# Patient Record
Sex: Male | Born: 1990
Health system: Southern US, Community
[De-identification: ages and names within clinical notes are randomized; demographics above are authoritative.]

## PROBLEM LIST (undated history)

## (undated) ENCOUNTER — Emergency Department (HOSPITAL_BASED_OUTPATIENT_CLINIC_OR_DEPARTMENT_OTHER): Admission: EM | Payer: BC Managed Care – PPO

## (undated) DIAGNOSIS — D571 Sickle-cell disease without crisis: Secondary | ICD-10-CM

## (undated) DIAGNOSIS — I2699 Other pulmonary embolism without acute cor pulmonale: Secondary | ICD-10-CM

## (undated) HISTORY — DX: Sickle-cell disease without crisis: D57.1

## (undated) HISTORY — PX: DENTAL SURGERY: SHX609

---
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
## 2011-08-16 ENCOUNTER — Ambulatory Visit (INDEPENDENT_AMBULATORY_CARE_PROVIDER_SITE_OTHER): Payer: BC Managed Care – PPO | Admitting: Emergency Medicine

## 2011-08-16 VITALS — BP 122/60 | HR 64 | Temp 98.1°F | Resp 18 | Ht 75.5 in | Wt 189.0 lb

## 2011-08-16 DIAGNOSIS — Z0000 Encounter for general adult medical examination without abnormal findings: Secondary | ICD-10-CM

## 2011-08-16 DIAGNOSIS — D571 Sickle-cell disease without crisis: Secondary | ICD-10-CM

## 2011-08-16 LAB — POCT CBC
Granulocyte percent: 62.2 %G (ref 37–80)
HCT, POC: 31.4 % — AB (ref 43.5–53.7)
Hemoglobin: 10 g/dL — AB (ref 14.1–18.1)
Lymph, poc: 5.3 — AB (ref 0.6–3.4)
MCH, POC: 25.6 pg — AB (ref 27–31.2)
MCHC: 31.8 g/dL (ref 31.8–35.4)
MCV: 80.4 fL (ref 80–97)
MID (cbc): 1.2 — AB (ref 0–0.9)
MPV: 9.2 fL (ref 0–99.8)
POC Granulocyte: 10.8 — AB (ref 2–6.9)
POC LYMPH PERCENT: 30.6 %L (ref 10–50)
POC MID %: 7.2 %M (ref 0–12)
Platelet Count, POC: 522 10*3/uL — AB (ref 142–424)
RBC: 3.9 M/uL — AB (ref 4.69–6.13)
RDW, POC: 26.4 %
WBC: 17.3 10*3/uL — AB (ref 4.6–10.2)

## 2011-08-16 LAB — POCT UA - MICROSCOPIC ONLY
Bacteria, U Microscopic: NEGATIVE
Casts, Ur, LPF, POC: NEGATIVE
Crystals, Ur, HPF, POC: NEGATIVE
Epithelial cells, urine per micros: NEGATIVE
Mucus, UA: NEGATIVE
RBC, urine, microscopic: NEGATIVE
WBC, Ur, HPF, POC: NEGATIVE
Yeast, UA: NEGATIVE

## 2011-08-16 LAB — POCT URINALYSIS DIPSTICK
Bilirubin, UA: NEGATIVE
Blood, UA: NEGATIVE
Glucose, UA: NEGATIVE
Ketones, UA: NEGATIVE
Leukocytes, UA: NEGATIVE
Nitrite, UA: NEGATIVE
Protein, UA: NEGATIVE
Spec Grav, UA: 1.015
Urobilinogen, UA: 1
pH, UA: 6.5

## 2011-08-16 LAB — RETICULOCYTES
ABS Retic: 311.6 10*3/uL — ABNORMAL HIGH (ref 19.0–186.0)
RBC.: 3.71 MIL/uL — ABNORMAL LOW (ref 4.22–5.81)
Retic Ct Pct: 8.4 % — ABNORMAL HIGH (ref 0.4–2.3)

## 2011-08-16 NOTE — Progress Notes (Signed)
Subjective:    Patient ID: Gregory Espinoza, male    DOB: [birth year], 21 y.o.   MRN: [id number]  HPI    Review of Systems  Constitutional: Negative.   HENT: Negative.   Eyes: Negative.   Respiratory: Negative.   Cardiovascular: Negative.   Gastrointestinal: Negative.   Genitourinary: Negative.   Musculoskeletal: Negative.   Neurological: Negative.   Hematological: Negative.   Psychiatric/Behavioral: Negative.        Objective:   Physical Exam  Constitutional: He is oriented to person, place, and time. He appears well-developed and well-nourished.  HENT:  Head: Normocephalic.  Right Ear: External ear normal.  Left Ear: External ear normal.  Eyes:       He has significant scleral icterus  Neck: No tracheal deviation present. No thyromegaly present.  Cardiovascular: Normal rate, regular rhythm and normal heart sounds.   Pulmonary/Chest: Effort normal and breath sounds normal. No respiratory distress. He has no wheezes. He has no rales. He exhibits no tenderness.  Abdominal: Soft. He exhibits no distension and no mass. There is no tenderness. There is no rebound and no guarding.  Musculoskeletal: Normal range of motion.       Patient has a kyphoscoliotic curve involves primarily his thoracic area  Neurological: He is alert and oriented to person, place, and time. No cranial nerve deficit. Coordination normal.  Skin: Skin is warm and dry.   Results for orders placed in visit on 08/16/11  POCT CBC      Component Value Range   WBC 17.3 (*) 4.6 - 10.2 K/uL   Lymph, poc 5.3 (*) 0.6 - 3.4   POC LYMPH PERCENT 30.6  10 - 50 %L   MID (cbc) 1.2 (*) 0 - 0.9   POC MID % 7.2  0 - 12 %M   POC Granulocyte 10.8 (*) 2 - 6.9   Granulocyte percent 62.2  37 - 80 %G   RBC 3.90 (*) 4.69 - 6.13 M/uL   Hemoglobin 10.0 (*) 14.1 - 18.1 g/dL   HCT, POC 40.9 (*) 81.1 - 53.7 %   MCV 80.4  80 - 97 fL   MCH, POC 25.6 (*) 27 - 31.2 pg   MCHC 31.8  31.8 - 35.4 g/dL   RDW, POC 91.4     Platelet  Count, POC 522 (*) 142 - 424 K/uL   MPV 9.2  0 - 99.8 fL  POCT URINALYSIS DIPSTICK      Component Value Range   Color, UA amber     Clarity, UA clear     Glucose, UA neg     Bilirubin, UA neg     Ketones, UA neg     Spec Grav, UA 1.015     Blood, UA neg     pH, UA 6.5     Protein, UA neg     Urobilinogen, UA 1.0     Nitrite, UA neg     Leukocytes, UA Negative    POCT UA - MICROSCOPIC ONLY      Component Value Range   WBC, Ur, HPF, POC neg     RBC, urine, microscopic neg     Bacteria, U Microscopic neg     Mucus, UA neg     Epithelial cells, urine per micros neg     Crystals, Ur, HPF, POC neg     Casts, Ur, LPF, POC neg     Yeast, UA neg           Assessment &  Plan:  Routine labs were done today. He does his sickle cell clinic once a year and should be up-to-date on all his vaccinations. He is currently on folic acid supplement but not on hydroxyurea

## 2011-08-16 NOTE — Patient Instructions (Addendum)
Sickle Cell Anemia Sickle cell anemia needs regular medical care by your caregiver and awareness by you when to seek medical care. Pain is a common problem in children with sickle cell disease. This usually starts at less than 20 year of age. Pain can occur nearly anywhere in the body, but most commonly happens in the extremities, back, chest, or belly (abdomen). Pain episodes can start suddenly or may follow an illness. These attacks can appear as decreased activity, loss of appetite, change in behavior, or simply complaints of pain. DIAGNOSIS   Specialized blood and gene testing can help make this diagnosis early in the disease. Blood tests may then be done to watch blood levels.   Specialized brain scans are done when there are problems in the brain during a crisis.   Lung testing may be done later in the disease.  HOME CARE INSTRUCTIONS   Maintain good hydration. Increase your child's fluid intake in hot weather and during exercise.   Avoid smoking around your child. Smoking lowers the oxygen in the blood and can cause sickling.   Control pain. Only give your child over-the-counter or prescription medicines for pain, discomfort, or fever as directed by their caregiver. Do not give aspirin to children because of the association with Reye's syndrome.   Keep regular health care checks to keep a proper red blood cell (hemoglobin) level. A moderate anemia level protects against sickling crises.   You or your child should receive all the same immunizations and care as the people around them.   Moms should breastfeed their babies if possible. Use formulas with iron added if breastfeeding is not possible. Additional iron should not be given unless there is a lack of it. People with SCD build up iron faster than normal. Give folic acid and additional vitamins as directed.   If you or your child has been prescribed antibiotics or other medications to prevent problems, give them as directed.    Summer camps are available for children with SCD. They may help young people deal with their disease. The camps introduce them to other children with the same condition.   Young people with SCD may become frustrated or angry at their disease. This can cause rebellion and refusal to follow medical care. Help groups or counseling may help with these problems.   Make sure your child wears a medical alert bracelet. When traveling, keep your medical information, caregiver's names, and the medications your child takes with you at all times.  SEEK IMMEDIATE MEDICAL CARE IF:   You or your child feels dizzy or faint.   You or your child develops a new onset of abdominal pain, especially on the left side near the stomach area.   You or your child develops a persistent, often uncomfortable and painful penile erection. This is called priapism. Always check young boys for this. It is often embarrassing for them and they may not bring it to your attention. This is a medical emergency and needs immediate treatment. If this is not treated it will lead to impotence.   You or your child develops numbness in or has a hard time moving arms and legs.   You or your child has a hard time with speech.   You have a fever.   You or your child develops signs of infection (chills, lethargy, irritability, poor eating, vomiting). The younger the child, the more you should be concerned.   With fevers, do not give medications to lower the fever right away. This   could cover up a problem that is developing. Notify your caregiver.   You or your child develops pain that is not helped with medicine.   You or your child develops shortness of breath, or is coughing up pus-like or bloody sputum.   You or your child develops any problems that are new and are causing you to worry.  Document Released: 04/26/2005 Document Revised: 01/05/2011 Document Reviewed: 06/16/2009 [employer] Patient Information 2012 [employer]. 

## 2011-08-17 LAB — TESTOSTERONE, FREE, TOTAL, SHBG
Sex Hormone Binding: 35 nmol/L (ref 13–71)
Testosterone, Free: 91.1 pg/mL (ref 47.0–244.0)
Testosterone-% Free: 2 % (ref 1.6–2.9)
Testosterone: 455.95 ng/dL (ref 300–890)

## 2011-08-17 LAB — TSH: TSH: 4.339 u[IU]/mL (ref 0.350–4.500)

## 2011-08-17 LAB — LIPID PANEL
Cholesterol: 87 mg/dL (ref 0–200)
HDL: 31 mg/dL — ABNORMAL LOW (ref >39)
LDL Cholesterol: 39 mg/dL (ref 0–99)
Total CHOL/HDL Ratio: 2.8 Ratio
Triglycerides: 85 mg/dL (ref <150)
VLDL: 17 mg/dL (ref 0–40)

## 2013-02-10 ENCOUNTER — Telehealth: Payer: Self-pay | Admitting: *Deleted

## 2013-02-10 NOTE — Telephone Encounter (Signed)
Received  [facility] fax from Dr. Cleta Albertsaub- Remind pt to see his sickle cell dr regularly. Sent the fax to be scanned into his chart.  LM on his home number. Please advise when he returns the call.

## 2014-03-27 ENCOUNTER — Ambulatory Visit (INDEPENDENT_AMBULATORY_CARE_PROVIDER_SITE_OTHER): Payer: BC Managed Care – PPO | Admitting: Family Medicine

## 2014-03-27 VITALS — BP 118/72 | HR 75 | Temp 97.5°F | Resp 18 | Ht 77.5 in | Wt 204.0 lb

## 2014-03-27 DIAGNOSIS — IMO0002 Reserved for concepts with insufficient information to code with codable children: Secondary | ICD-10-CM

## 2014-03-27 DIAGNOSIS — R9431 Abnormal electrocardiogram [ECG] [EKG]: Secondary | ICD-10-CM

## 2014-03-27 DIAGNOSIS — R002 Palpitations: Secondary | ICD-10-CM

## 2014-03-27 DIAGNOSIS — D571 Sickle-cell disease without crisis: Secondary | ICD-10-CM

## 2014-03-27 DIAGNOSIS — G8929 Other chronic pain: Secondary | ICD-10-CM

## 2014-03-27 DIAGNOSIS — R519 Headache, unspecified: Secondary | ICD-10-CM

## 2014-03-27 DIAGNOSIS — R51 Headache: Secondary | ICD-10-CM

## 2014-03-27 DIAGNOSIS — D596 Hemoglobinuria due to hemolysis from other external causes: Secondary | ICD-10-CM

## 2014-03-27 LAB — COMPREHENSIVE METABOLIC PANEL
ALT: 15 U/L (ref 0–53)
AST: 24 U/L (ref 0–37)
Albumin: 4.2 g/dL (ref 3.5–5.2)
Alkaline Phosphatase: 46 U/L (ref 39–117)
BUN: 9 mg/dL (ref 6–23)
CO2: 24 mEq/L (ref 19–32)
Calcium: 9.3 mg/dL (ref 8.4–10.5)
Chloride: 104 mEq/L (ref 96–112)
Creat: 0.66 mg/dL (ref 0.50–1.35)
Glucose, Bld: 81 mg/dL (ref 70–99)
Potassium: 4.4 mEq/L (ref 3.5–5.3)
Sodium: 137 mEq/L (ref 135–145)
Total Bilirubin: 8 mg/dL — ABNORMAL HIGH (ref 0.2–1.2)
Total Protein: 7.4 g/dL (ref 6.0–8.3)

## 2014-03-27 LAB — POCT CBC
Granulocyte percent: 75.5 %G (ref 37–80)
HCT, POC: 29.6 % — AB (ref 43.5–53.7)
Hemoglobin: 9.5 g/dL — AB (ref 14.1–18.1)
Lymph, poc: 4 — AB (ref 0.6–3.4)
MCH, POC: 25.6 pg — AB (ref 27–31.2)
MCHC: 32 g/dL (ref 31.8–35.4)
MCV: 80.1 fL (ref 80–97)
MID (cbc): 0.9 (ref 0–0.9)
MPV: 7.4 fL (ref 0–99.8)
POC Granulocyte: 15.3 — AB (ref 2–6.9)
POC LYMPH PERCENT: 19.9 %L (ref 10–50)
POC MID %: 4.6 %M (ref 0–12)
Platelet Count, POC: 481 10*3/uL — AB (ref 142–424)
RBC: 3.69 M/uL — AB (ref 4.69–6.13)
RDW, POC: 26 %
WBC: 20.3 10*3/uL — AB (ref 4.6–10.2)

## 2014-03-27 MED ORDER — PROPRANOLOL HCL 10 MG PO TABS: 10.0000 mg | ORAL_TABLET | Freq: Three times a day (TID) | ORAL | Status: DC

## 2014-03-27 NOTE — Patient Instructions (Signed)
Please return in one week for follow up 

## 2014-03-27 NOTE — Progress Notes (Addendum)
Subjective:    Patient ID: Gregory Espinoza, male    DOB: Jul 07, 1990, 24 y.o.   MRN: [id number] This chart was scribed for Gregory Sidle, MD by Jolene Provost, Medical Scribe. This patient was seen in Room 3 and the patient's care was started a 9:25 AM.  Chief Complaint  Patient presents with   Palpitations    x1 week    Headache    few mths pain in back of head     HPI HPI Comments: Gregory Espinoza is a 24 y.o. male with a hx of sickle cell disease who presents to [facility] complaining of HA for the last eight months and palpitations for the last week. Pt states he has a family hx of HTN. Pt denies family hx of heart disease or HA. Pt states he has a current infection in his tooth and is on abx or this. Pt states he was not put on penicillin when he was a child. Pt states he does not exercise regularly. Pt states he hemoglobin was within normal limits at last test.  Pt states he has not done any sports since he has been in school.  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  Review of Systems  Constitutional: Negative for fever and chills.  Respiratory: Positive for chest tightness.   Cardiovascular: Positive for palpitations.  Gastrointestinal: Positive for abdominal pain. Negative for nausea.  Neurological: Positive for headaches.       Objective:   Physical Exam  Constitutional: He is oriented to person, place, and time. He appears well-developed and well-nourished. No distress.  HENT:  Head: Normocephalic and atraumatic.  Mouth/Throat: Oropharynx is clear and moist.  Eyes: Pupils are equal, round, and reactive to light.  Mild scleral icterus.  Neck: Neck supple.  Cardiovascular: Normal rate, regular rhythm and normal heart sounds.   No murmur heard. Occasional skipped beat.  Pulmonary/Chest: Effort normal and breath sounds normal. No respiratory distress.  Musculoskeletal: Normal range of motion.  Neurological: He is alert and oriented to person,  place, and time. Coordination normal.  Skin: Skin is warm and dry. He is not diaphoretic.  Psychiatric: He has a normal mood and affect. His behavior is normal.  Nursing note and vitals reviewed.  EKG:  occas PVC  Results for orders placed or performed in visit on 03/27/14  POCT CBC  Result Value Ref Range   WBC 20.3 (A) 4.6 - 10.2 K/uL   Lymph, poc 4.0 (A) 0.6 - 3.4   POC LYMPH PERCENT 19.9 10 - 50 %L   MID (cbc) 0.9 0 - 0.9   POC MID % 4.6 0 - 12 %M   POC Granulocyte 15.3 (A) 2 - 6.9   Granulocyte percent 75.5 37 - 80 %G   RBC 3.69 (A) 4.69 - 6.13 M/uL   Hemoglobin 9.5 (A) 14.1 - 18.1 g/dL   HCT, POC 40.9 (A) 81.1 - 53.7 %   MCV 80.1 80 - 97 fL   MCH, POC 25.6 (A) 27 - 31.2 pg   MCHC 32.0 31.8 - 35.4 g/dL   RDW, POC 91.4 %   Platelet Count, POC 481 (A) 142 - 424 K/uL   MPV 7.4 0 - 99.8 fL       Assessment & Plan:   This chart was scribed in my presence and reviewed by me personally.    ICD-9-CM ICD-10-CM   1. Palpitations 785.1 R00.2 POCT CBC     Comprehensive metabolic panel  EKG 12-Lead     Ambulatory referral to Cardiology  2. Chronic nonintractable headache, unspecified headache type 784.0 R51 POCT CBC     Comprehensive metabolic panel     EKG 12-Lead     propranolol (INDERAL) 10 MG tablet  3. Nonspecific abnormal electrocardiogram (ECG) (EKG) 794.31 R94.31 Ambulatory referral to Cardiology  4. 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

## 2014-03-27 NOTE — Addendum Note (Signed)
Addended by: Elvina SidleLAUENSTEIN, Dawnette Mione on: 03/27/2014 05:50 PM   Modules accepted: Orders

## 2014-04-07 ENCOUNTER — Ambulatory Visit (INDEPENDENT_AMBULATORY_CARE_PROVIDER_SITE_OTHER): Payer: BC Managed Care – PPO | Admitting: Family Medicine

## 2014-04-07 VITALS — BP 110/60 | HR 74 | Temp 98.8°F | Resp 20 | Ht 77.5 in | Wt 201.5 lb

## 2014-04-07 DIAGNOSIS — I493 Ventricular premature depolarization: Secondary | ICD-10-CM

## 2014-04-07 DIAGNOSIS — D571 Sickle-cell disease without crisis: Secondary | ICD-10-CM | POA: Diagnosis not present

## 2014-04-07 DIAGNOSIS — R002 Palpitations: Secondary | ICD-10-CM

## 2014-04-07 NOTE — Patient Instructions (Signed)
Results for orders placed or performed in visit on 03/27/14  Comprehensive metabolic panel  Result Value Ref Range   Sodium 137 135 - 145 mEq/L   Potassium 4.4 3.5 - 5.3 mEq/L   Chloride 104 96 - 112 mEq/L   CO2 24 19 - 32 mEq/L   Glucose, Bld 81 70 - 99 mg/dL   BUN 9 6 - 23 mg/dL   Creat 1.610.66 0.960.50 - 0.451.35 mg/dL   Total Bilirubin 8.0 (H) 0.2 - 1.2 mg/dL   Alkaline Phosphatase 46 39 - 117 U/L   AST 24 0 - 37 U/L   ALT 15 0 - 53 U/L   Total Protein 7.4 6.0 - 8.3 g/dL   Albumin 4.2 3.5 - 5.2 g/dL   Calcium 9.3 8.4 - 40.910.5 mg/dL  POCT CBC  Result Value Ref Range   WBC 20.3 (A) 4.6 - 10.2 K/uL   Lymph, poc 4.0 (A) 0.6 - 3.4   POC LYMPH PERCENT 19.9 10 - 50 %L   MID (cbc) 0.9 0 - 0.9   POC MID % 4.6 0 - 12 %M   POC Granulocyte 15.3 (A) 2 - 6.9   Granulocyte percent 75.5 37 - 80 %G   RBC 3.69 (A) 4.69 - 6.13 M/uL   Hemoglobin 9.5 (A) 14.1 - 18.1 g/dL   HCT, POC 81.129.6 (A) 91.443.5 - 53.7 %   MCV 80.1 80 - 97 fL   MCH, POC 25.6 (A) 27 - 31.2 pg   MCHC 32.0 31.8 - 35.4 g/dL   RDW, POC 78.226.0 %   Platelet Count, POC 481 (A) 142 - 424 K/uL   MPV 7.4 0 - 99.8 fL

## 2014-04-07 NOTE — Progress Notes (Addendum)
This chart was scribed for Dr. Elvina Sidle, MD by Jarvis Morgan, Medical Scribe. This patient was seen in Room 10 and the patient's care was started at 6:08 PM.  Patient ID: Gregory Espinoza MRN: [id number], DOB: March 25, 1990, 24 y.o. Date of Encounter: 04/07/2014, 6:04 PM  Primary Physician: No primary care provider on file.  Chief Complaint:  Chief Complaint  Patient presents with  . Follow-up    03/27/2014 Visit     HPI: 24 y.o. year old male with history below presents for a follow up from his visit with Dr. Milus Glazier on 03/27/14. Pt has been having ongoing heart palpitations for over 2 weeks and states they have been gradually worsening. He has a family h/o HTN. At pt's last visit he was also having intermittent HAs for about 8 months, but he states that his HAs have gotten better at this time. Pt has not had any recent f/u with a specialist for his sickle cell anemia. Pt states he regularly gets mild SOB with exertion. Pt notes he has had a few intermittent sharp pains in his abdomen and mild back pain. He has been taking propanol since his last visit with no relief. He denies any fevers and lower extremity swelling   Past Medical History  Diagnosis Date  . Sickle cell anemia      Home Meds: Prior to Admission medications   Medication Sig Start Date End Date Taking? Authorizing Provider  propranolol (INDERAL) 10 MG tablet Take 1 tablet (10 mg total) by mouth 3 (three) times daily. 03/27/14  Yes Elvina Sidle, MD    Allergies: No Known Allergies  History   Social History  . Marital Status: Single    Spouse Name: N/A  . Number of Children: N/A  . Years of Education: N/A   Occupational History  . Not on file.   Social History Main Topics  . Smoking status: Never Smoker   . Smokeless tobacco: Never Used  . Alcohol Use: No  . Drug Use: No  . Sexual Activity: Not on file   Other Topics Concern  . Not on file   Social History Narrative     Review of  Systems Constitutional: negative for chills, fever, night sweats, weight changes, or fatigue  HEENT: negative for vision changes, hearing loss, congestion, rhinorrhea, ST, epistaxis, or sinus pressure Cardiovascular: positive for palpitations. negative for chest pain or leg swelling Respiratory: positive for shortness of breath. negative for hemoptysis, wheezing, or cough Abdominal: positive for abdominal pain (mild and intermittent). Negative for nausea, vomiting, diarrhea, or constipation Dermatological: negative for rash Musk: positive for back pain (mild). negative for gait problem Neurologic: negative for headache, dizziness, or syncope All other systems reviewed and are otherwise negative with the exception to those above and in the HPI.   Physical Exam: Blood pressure 110/60, pulse 74, temperature 98.8 F (37.1 C), temperature source Oral, resp. rate 20, height 6' 5.5" (1.969 m), weight 201 lb 8 oz (91.4 kg), SpO2 93 %., Body mass index is 23.58 kg/(m^2). General: Well developed, well nourished, in no acute distress. Head: Normocephalic, atraumatic, eyes without discharge, sclera icteric, nares are without discharge. Bilateral auditory canals clear, TM's are without perforation, pearly grey and translucent with reflective cone of light bilaterally. Oral cavity moist, posterior pharynx without exudate, erythema, peritonsillar abscess, or post nasal drip.  Neck: Supple. No thyromegaly. Full ROM. No lymphadenopathy. Lungs: Clear bilaterally to auscultation without wheezes, rales, or rhonchi. Breathing is unlabored. Heart: RRR with S1 S2. No  murmurs, rubs, or gallops appreciated. Abdomen: Moderate hepatosplenomegaly. Soft, non-tender, non-distended with normoactive bowel sounds. . No rebound/guarding. No obvious abdominal masses.  Msk:  Strength and tone normal for age. Extremities/Skin: Warm and dry. No clubbing or cyanosis. No edema. No rashes or suspicious lesions. Neuro: Alert and  oriented X 3. Moves all extremities spontaneously. Gait is normal. CNII-XII grossly in tact. Psych:  Responds to questions appropriately with a normal affect.   Labs: Results for orders placed or performed in visit on 03/27/14  Comprehensive metabolic panel  Result Value Ref Range   Sodium 137 135 - 145 mEq/L   Potassium 4.4 3.5 - 5.3 mEq/L   Chloride 104 96 - 112 mEq/L   CO2 24 19 - 32 mEq/L   Glucose, Bld 81 70 - 99 mg/dL   BUN 9 6 - 23 mg/dL   Creat 9.140.66 7.820.50 - 9.561.35 mg/dL   Total Bilirubin 8.0 (H) 0.2 - 1.2 mg/dL   Alkaline Phosphatase 46 39 - 117 U/L   AST 24 0 - 37 U/L   ALT 15 0 - 53 U/L   Total Protein 7.4 6.0 - 8.3 g/dL   Albumin 4.2 3.5 - 5.2 g/dL   Calcium 9.3 8.4 - 21.310.5 mg/dL  POCT CBC  Result Value Ref Range   WBC 20.3 (A) 4.6 - 10.2 K/uL   Lymph, poc 4.0 (A) 0.6 - 3.4   POC LYMPH PERCENT 19.9 10 - 50 %L   MID (cbc) 0.9 0 - 0.9   POC MID % 4.6 0 - 12 %M   POC Granulocyte 15.3 (A) 2 - 6.9   Granulocyte percent 75.5 37 - 80 %G   RBC 3.69 (A) 4.69 - 6.13 M/uL   Hemoglobin 9.5 (A) 14.1 - 18.1 g/dL   HCT, POC 08.629.6 (A) 57.843.5 - 53.7 %   MCV 80.1 80 - 97 fL   MCH, POC 25.6 (A) 27 - 31.2 pg   MCHC 32.0 31.8 - 35.4 g/dL   RDW, POC 46.926.0 %   Platelet Count, POC 481 (A) 142 - 424 K/uL   MPV 7.4 0 - 99.8 fL     ASSESSMENT AND PLAN:  24 y.o. year old male with   1. Palpitations   2. PVC (premature ventricular contraction)   3. Hyperbilirubinemia   4. Hb-SS disease without crisis    This chart was scribed in my presence and reviewed by me personally.    ICD-9-CM ICD-10-CM   1. Palpitations 785.1 R00.2 Ambulatory referral to Cardiology     CBC with Differential/Platelet     Comprehensive metabolic panel  2. PVC (premature ventricular contraction) 427.69 I49.3 Ambulatory referral to Cardiology     Comprehensive metabolic panel  3. Hyperbilirubinemia 782.4 E80.6 Ambulatory referral to Hematology     CBC with Differential/Platelet     Comprehensive metabolic panel   4. 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

## 2014-04-08 LAB — CBC WITH DIFFERENTIAL/PLATELET
Basophils Absolute: 0.1 10*3/uL (ref 0.0–0.1)
Basophils Relative: 1 % (ref 0–1)
Eosinophils Absolute: 0.7 10*3/uL (ref 0.0–0.7)
Eosinophils Relative: 5 % (ref 0–5)
HCT: 27.3 % — ABNORMAL LOW (ref 39.0–52.0)
Hemoglobin: 9.1 g/dL — ABNORMAL LOW (ref 13.0–17.0)
Lymphocytes Relative: 34 % (ref 12–46)
Lymphs Abs: 4.9 10*3/uL — ABNORMAL HIGH (ref 0.7–4.0)
MCH: 25.3 pg — ABNORMAL LOW (ref 26.0–34.0)
MCHC: 33.3 g/dL (ref 30.0–36.0)
MCV: 76 fL — ABNORMAL LOW (ref 78.0–100.0)
MPV: 9.1 fL (ref 8.6–12.4)
Monocytes Absolute: 1.6 10*3/uL — ABNORMAL HIGH (ref 0.1–1.0)
Monocytes Relative: 11 % (ref 3–12)
Neutro Abs: 7.1 10*3/uL (ref 1.7–7.7)
Neutrophils Relative %: 49 % (ref 43–77)
Platelets: 531 10*3/uL — ABNORMAL HIGH (ref 150–400)
RBC: 3.59 MIL/uL — ABNORMAL LOW (ref 4.22–5.81)
RDW: 24.6 % — ABNORMAL HIGH (ref 11.5–15.5)
WBC: 14.5 10*3/uL — ABNORMAL HIGH (ref 4.0–10.5)

## 2014-04-08 LAB — COMPREHENSIVE METABOLIC PANEL
ALT: 11 U/L (ref 0–53)
AST: 24 U/L (ref 0–37)
Albumin: 4.4 g/dL (ref 3.5–5.2)
Alkaline Phosphatase: 43 U/L (ref 39–117)
BUN: 9 mg/dL (ref 6–23)
CO2: 27 mEq/L (ref 19–32)
Calcium: 9.3 mg/dL (ref 8.4–10.5)
Chloride: 104 mEq/L (ref 96–112)
Creat: 0.7 mg/dL (ref 0.50–1.35)
Glucose, Bld: 79 mg/dL (ref 70–99)
Potassium: 4.4 mEq/L (ref 3.5–5.3)
Sodium: 139 mEq/L (ref 135–145)
Total Bilirubin: 7.7 mg/dL — ABNORMAL HIGH (ref 0.2–1.2)
Total Protein: 7.8 g/dL (ref 6.0–8.3)

## 2014-04-09 ENCOUNTER — Telehealth: Payer: Self-pay | Admitting: Oncology

## 2014-04-09 NOTE — Telephone Encounter (Signed)
CALLED REFERRING OFFICE TO INFORM THAT WE DO NOT SEE SICKLE CELL PATIENTS.  WILL REFER TO [facility].

## 2014-04-24 DIAGNOSIS — D571 Sickle-cell disease without crisis: Secondary | ICD-10-CM | POA: Insufficient documentation

## 2014-04-27 ENCOUNTER — Telehealth: Payer: Self-pay

## 2014-04-27 NOTE — Telephone Encounter (Signed)
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  I told him there is a possibility that he made need some shots to complete the form.  If he does please let him know.  The form has been left at the nurses desk.  His number is [phone]

## 2014-04-28 NOTE — Telephone Encounter (Signed)
Form in nurses box.

## 2014-04-28 NOTE — Telephone Encounter (Signed)
Called pt, advised we cannot fill out this form without his immunization record. We checked the [facility] and no uploads of any immunizations were listed. Pt states he will get a copy of his immunizations and bring them to us to review.

## 2014-04-29 ENCOUNTER — Telehealth: Payer: Self-pay

## 2014-04-29 NOTE — Telephone Encounter (Signed)
Form in Dr. Lauenstein box. 

## 2014-04-29 NOTE — Telephone Encounter (Signed)
Pt dropped off immunization record and form he is requesting us to fill out.

## 2014-05-22 ENCOUNTER — Ambulatory Visit: Payer: Self-pay | Admitting: Cardiovascular Disease

## 2015-01-25 ENCOUNTER — Ambulatory Visit (INDEPENDENT_AMBULATORY_CARE_PROVIDER_SITE_OTHER): Payer: BC Managed Care – PPO | Admitting: Family Medicine

## 2015-01-25 ENCOUNTER — Ambulatory Visit (INDEPENDENT_AMBULATORY_CARE_PROVIDER_SITE_OTHER): Payer: BC Managed Care – PPO

## 2015-01-25 VITALS — BP 128/74 | HR 66 | Temp 98.5°F | Resp 16 | Ht 77.5 in | Wt 196.6 lb

## 2015-01-25 DIAGNOSIS — M79672 Pain in left foot: Secondary | ICD-10-CM | POA: Diagnosis not present

## 2015-01-25 DIAGNOSIS — R17 Unspecified jaundice: Secondary | ICD-10-CM | POA: Diagnosis not present

## 2015-01-25 DIAGNOSIS — D571 Sickle-cell disease without crisis: Secondary | ICD-10-CM

## 2015-01-25 DIAGNOSIS — L03119 Cellulitis of unspecified part of limb: Secondary | ICD-10-CM

## 2015-01-25 DIAGNOSIS — L02419 Cutaneous abscess of limb, unspecified: Secondary | ICD-10-CM | POA: Diagnosis not present

## 2015-01-25 LAB — HEPATIC FUNCTION PANEL
ALT: 18 U/L (ref 9–46)
AST: 29 U/L (ref 10–40)
Albumin: 4.3 g/dL (ref 3.6–5.1)
Alkaline Phosphatase: 62 U/L (ref 40–115)
Bilirubin, Direct: 0.7 mg/dL — ABNORMAL HIGH (ref <=0.2)
Indirect Bilirubin: 7.4 mg/dL — ABNORMAL HIGH (ref 0.2–1.2)
Total Bilirubin: 8.1 mg/dL — ABNORMAL HIGH (ref 0.2–1.2)
Total Protein: 7.4 g/dL (ref 6.1–8.1)

## 2015-01-25 LAB — POCT CBC
Granulocyte percent: 65.9 %G (ref 37–80)
HCT, POC: 26.7 % — AB (ref 43.5–53.7)
Hemoglobin: 9.1 g/dL — AB (ref 14.1–18.1)
Lymph, poc: 4.7 — AB (ref 0.6–3.4)
MCH, POC: 26.1 pg — AB (ref 27–31.2)
MCHC: 34 g/dL (ref 31.8–35.4)
MCV: 76.6 fL — AB (ref 80–97)
MID (cbc): 0.7 (ref 0–0.9)
MPV: 7.2 fL (ref 0–99.8)
POC Granulocyte: 10.5 — AB (ref 2–6.9)
POC LYMPH PERCENT: 29.6 %L (ref 10–50)
POC MID %: 4.5 %M (ref 0–12)
Platelet Count, POC: 438 10*3/uL — AB (ref 142–424)
RBC: 3.48 M/uL — AB (ref 4.69–6.13)
RDW, POC: 25.3 %
WBC: 16 10*3/uL — AB (ref 4.6–10.2)

## 2015-01-25 MED ORDER — MELOXICAM 7.5 MG PO TABS: 7.5000 mg | ORAL_TABLET | Freq: Every day | ORAL | Status: DC

## 2015-01-25 MED ORDER — AMOXICILLIN-POT CLAVULANATE 875-125 MG PO TABS: 1.0000 | ORAL_TABLET | Freq: Two times a day (BID) | ORAL | Status: DC

## 2015-01-25 NOTE — Progress Notes (Signed)
 @[facility] @  By signing my name below, I, Raven Small, attest that this documentation has been prepared under the direction and in the presence of Elvina SidleKurt Nonie Lochner, MD.  Electronically Signed: Andrew Auaven Small, ED Scribe. 01/25/2015. 3:00 PM.  Patient ID: Gregory Espinoza MRN: [id number], DOB: August 15, 1990, 24 y.o. Date of Encounter: 01/25/2015, 3:00 PM  Primary Physician: No primary care provider on file.  Chief Complaint:  Chief Complaint  Patient presents with  . Foot Pain    left foot, was swollen yesterday, had a spider bite on same foot    HPI: 24 y.o. year old male with history below presents with left foot pain that began 2 days ago . He denies injury or fall. He reports pain with certain movements and had swelling to left foot yesterday. He states he was bitten by an insect 2 days ago to left calf. States wound has had minimal drainage. He's taken tylenol for his symptoms.  He has hx of sickle cell anemia. He reports having a crisis once at age 66/14. His last hgb was 8.   Pt has noticed darken discoloration to great toe and medial aspect to left foot that has spread over the past couple of month.    Past Medical History  Diagnosis Date  . Sickle cell anemia (HCC)      Home Meds: Prior to Admission medications   Not on File    Allergies: No Known Allergies  Social History   Social History  . Marital Status: Single    Spouse Name: N/A  . Number of Children: N/A  . Years of Education: N/A   Occupational History  . Not on file.   Social History Main Topics  . Smoking status: Never Smoker   . Smokeless tobacco: Never Used  . Alcohol Use: No  . Drug Use: No  . Sexual Activity: Not on file   Other Topics Concern  . Not on file   Social History Narrative     Review of Systems: Constitutional: negative for chills, fever, night sweats, weight changes, or fatigue  HEENT: negative for vision changes, hearing loss, congestion, rhinorrhea, ST, epistaxis, or sinus  pressure Cardiovascular: negative for chest pain or palpitations Respiratory: negative for hemoptysis, wheezing, shortness of breath, or cough Abdominal: negative for abdominal pain, nausea, vomiting, diarrhea, or constipation Dermatological: negative for rash Neurologic: negative for headache, dizziness, or syncope All other systems reviewed and are otherwise negative with the exception to those above and in the HPI.   Physical Exam: Blood pressure 128/74, pulse 66, temperature 98.5 F (36.9 C), temperature source Oral, resp. rate 16, height 6' 5.5" (1.969 m), weight 196 lb 9.6 oz (89.177 kg), SpO2 95 %., Body mass index is 23 kg/(m^2). General: Well developed, well nourished, in no acute distress. Head: Normocephalic, atraumatic, eyes without discharge, sclera non-icteric, nares are without discharge. Bilateral auditory canals clear, TM's are without perforation, pearly grey and translucent with reflective cone of light bilaterally. Oral cavity moist, posterior pharynx without exudate, erythema, peritonsillar abscess, or post nasal drip. Jaundice with sclera icterus Neck: Supple. No thyromegaly. Full ROM. No lymphadenopathy. Lungs: Clear bilaterally to auscultation without wheezes, rales, or rhonchi. Breathing is unlabored. Heart: RRR with S1 S2. No murmurs, rubs, or gallops appreciated. Abdomen: Soft, non-tender, non-distended with normoactive bowel sounds. No hepatomegaly. No rebound/guarding. No obvious abdominal masses. Msk:  Strength and tone normal for age. Extremities/Skin: Warm and dry. No clubbing or cyanosis. No edema. No rashes or suspicious lesions. Hyperpigmentation over left great toes. Small  ulcer to left calf with 1 cm area of induration.. Neuro: Alert and oriented X 3. Moves all extremities spontaneously. Gait is normal. CNII-XII grossly in tact. Psych:  Responds to questions appropriately with a normal affect.   Labs: Results for orders placed or performed in visit on  01/25/15  POCT CBC  Result Value Ref Range   WBC 16.0 (A) 4.6 - 10.2 K/uL   Lymph, poc 4.7 (A) 0.6 - 3.4   POC LYMPH PERCENT 29.6 10 - 50 %L   MID (cbc) 0.7 0 - 0.9   POC MID % 4.5 0 - 12 %M   POC Granulocyte 10.5 (A) 2 - 6.9   Granulocyte percent 65.9 37 - 80 %G   RBC 3.48 (A) 4.69 - 6.13 M/uL   Hemoglobin 9.1 (A) 14.1 - 18.1 g/dL   HCT, POC 08.6 (A) 57.8 - 53.7 %   MCV 76.6 (A) 80 - 97 fL   MCH, POC 26.1 (A) 27 - 31.2 pg   MCHC 34.0 31.8 - 35.4 g/dL   RDW, POC 46.9 %   Platelet Count, POC 438 (A) 142 - 424 K/uL   MPV 7.2 0 - 99.8 fL   [facility] reading (PRIMARY) by Dt. Takiyah Bohnsack. Left foot  normal  ASSESSMENT AND PLAN:  24 y.o. year old male with cellulitis left leg and a tendinitis in the left foot long with some hyperpigmentation without any tenderness This chart was scribed in my presence and reviewed by me personally.    ICD-9-CM ICD-10-CM   1. Hb-SS disease without crisis (HCC) 282.61 D57.1 POCT CBC     Hepatic function panel  2. Jaundice 782.4 R17 Hepatic function panel  3. Left foot pain 729.5 M79.672 DG Foot Complete Left     meloxicam (MOBIC) 7.5 MG tablet  4. Cellulitis and abscess of leg 682.6 L02.419 POCT CBC    L03.119 amoxicillin-clavulanate (AUGMENTIN) 875-125 MG tablet    Signed, Elvina Sidle, MD 01/25/2015 3:00 PM

## 2015-01-25 NOTE — Patient Instructions (Signed)

## 2018-03-13 DIAGNOSIS — R7401 Elevation of levels of liver transaminase levels: Secondary | ICD-10-CM | POA: Insufficient documentation

## 2018-06-14 ENCOUNTER — Encounter (HOSPITAL_COMMUNITY): Payer: Self-pay

## 2018-06-14 ENCOUNTER — Ambulatory Visit (HOSPITAL_COMMUNITY)
Admission: EM | Admit: 2018-06-14 | Discharge: 2018-06-14 | Disposition: A | Discharge status: Home / Self Care | Payer: PPO | Attending: Family Medicine | Admitting: Family Medicine

## 2018-06-14 ENCOUNTER — Ambulatory Visit (INDEPENDENT_AMBULATORY_CARE_PROVIDER_SITE_OTHER): Payer: PPO

## 2018-06-14 ENCOUNTER — Other Ambulatory Visit: Payer: Self-pay

## 2018-06-14 DIAGNOSIS — M222X2 Patellofemoral disorders, left knee: Secondary | ICD-10-CM

## 2018-06-14 MED ORDER — NAPROXEN 500 MG PO TABS: 500.0000 mg | ORAL_TABLET | Freq: Two times a day (BID) | ORAL | 0 refills | Status: DC

## 2018-06-14 NOTE — ED Provider Notes (Signed)
[facility]    CSN: [id number] Arrival date & time: 06/14/18  0840     History   Chief Complaint Chief Complaint  Patient presents with   Knee Pain    HPI Gregory Espinoza is a 28 y.o. male.   Patient is a 28 year old male who presents today with left knee pain.  This is been present and worsening over the past week.  The pain is located around the patella and going proximally.  The pain is worse with walking, bending and stooping.  Reports worsening last night and unable to sleep due to throbbing while laying in bed.  Denies any specific injury.  He has not taken anything for his pain.  He has been using a crutch to help with walking.  No bruising, swelling or erythema.   ROS per HPI      Past Medical History:  Diagnosis Date   Sickle cell anemia (HCC)     There are no active problems to display for this patient.   Past Surgical History:  Procedure Laterality Date   DENTAL SURGERY         Home Medications    Prior to Admission medications   Medication Sig Start Date End Date Taking? Authorizing Provider  folic acid (FOLVITE) 800 MCG tablet Take 400 mcg by mouth daily.   Yes [provider]  hydroxyurea (HYDREA) 500 MG capsule Take 500 mg by mouth daily. May take with food to minimize GI side effects.   Yes [provider]  vitamin C (ASCORBIC ACID) 500 MG tablet Take 500 mg by mouth daily.   Yes [provider]  naproxen (NAPROSYN) 500 MG tablet Take 1 tablet (500 mg total) by mouth 2 (two) times daily. 06/14/18   Janace ArisBast, Betha Shadix A, NP    Family History History reviewed. No pertinent family history.  Social History Social History   Tobacco Use   Smoking status: Never Smoker   Smokeless tobacco: Never Used  Substance Use Topics   Alcohol use: No    Alcohol/week: 0.0 standard drinks   Drug use: No     Allergies   Patient has no known allergies.   Review of Systems Review of Systems   Physical  Exam Triage Vital Signs ED Triage Vitals  Enc Vitals Group     BP 06/14/18 0901 125/86     Pulse Rate 06/14/18 0901 79     Resp 06/14/18 0901 18     Temp 06/14/18 0901 98.4 F (36.9 C)     Temp Source 06/14/18 0901 Oral     SpO2 06/14/18 0901 100 %     Weight 06/14/18 0859 190 lb (86.2 kg)     Height 06/14/18 0859 6\' 4"  (1.93 m)     Head Circumference --      Peak Flow --      Pain Score 06/14/18 0858 8     Pain Loc --      Pain Edu? --      Excl. in GC? --    No data found.  Updated Vital Signs BP 125/86 (BP Location: Left Arm)    Pulse 79    Temp 98.4 F (36.9 C) (Oral)    Resp 18    Ht 6\' 4"  (1.93 m)    Wt 190 lb (86.2 kg)    SpO2 100%    BMI 23.13 kg/m   Visual Acuity Right Eye Distance:   Left Eye Distance:   Bilateral Distance:  Right Eye Near:   Left Eye Near:    Bilateral Near:     Physical Exam Vitals signs and nursing note reviewed.  Constitutional:      General: He is not in acute distress.    Appearance: Normal appearance. He is not ill-appearing, toxic-appearing or diaphoretic.  HENT:     Head: Normocephalic and atraumatic.     Nose: Nose normal.  Eyes:     Conjunctiva/sclera: Conjunctivae normal.  Neck:     Musculoskeletal: Normal range of motion.  Pulmonary:     Effort: Pulmonary effort is normal.  Musculoskeletal:        General: Tenderness present. No swelling, deformity or signs of injury.     Comments: Tender to left patella inferior and medially.  No swelling, bruising or deformities.  No laxity. Most pain with valgus  Skin:    General: Skin is warm and dry.  Neurological:     Mental Status: He is alert.  Psychiatric:        Mood and Affect: Mood normal.      UC Treatments / Results  Labs (all labs ordered are listed, but only abnormal results are displayed) Labs Reviewed - No data to display  EKG None  Radiology Dg Knee Complete 4 Views Left  Result Date: 06/14/2018 CLINICAL DATA:  28 year old male with a history of  left knee pain EXAM: LEFT KNEE - COMPLETE 4+ VIEW COMPARISON:  None. FINDINGS: No acute displaced fracture. No joint effusion. Degenerative changes of the patellofemoral joint. No significant degenerative changes at the medial or lateral compartment. No radiopaque foreign body. No focal soft tissue swelling. IMPRESSION: Negative for acute bony abnormality Electronically Signed   By: Gilmer Mor D.O.   On: 06/14/2018 10:13    Procedures Procedures (including critical care time)  Medications Ordered in UC Medications - No data to display  Initial Impression / Assessment and Plan / UC Course  I have reviewed the triage vital signs and the nursing notes.  Pertinent labs & imaging results that were available during my care of the patient were reviewed by me and considered in my medical decision making (see chart for details).     X-ray negative for any acute abnormalities. Symptoms consistent with patellofemoral syndrome. Will have patient rest, ice and elevate.  Wear the knee brace that was provided here in clinic. Naproxen for pain inflammation Recommended follow-up with orthopedic for continued or worsening symptoms Final Clinical Impressions(s) / UC Diagnoses   Final diagnoses:  Patellofemoral pain syndrome of left knee     Discharge Instructions     Your x ray was normal I believe this is patella femoral syndrome Symptoms should resolve with rest, ice, elevation and naproxen for pain and inflammation.  Wear the knee brace  If symptoms do not improve in the next week or so then you will need to see orthopedics     ED Prescriptions    Medication Sig Dispense Auth. Provider   naproxen (NAPROSYN) 500 MG tablet Take 1 tablet (500 mg total) by mouth 2 (two) times daily. 30 tablet Dahlia Byes A, NP     Controlled Substance Prescriptions [place] Controlled Substance Registry consulted? Not Applicable   Janace Aris, NP 06/14/18 1024

## 2018-06-14 NOTE — ED Triage Notes (Signed)
Patient presents to Urgent Care with complaints of left knee pain since last week. Patient reports he thinks he may have pulled a muscle.

## 2018-06-14 NOTE — Discharge Instructions (Addendum)
Your x ray was normal I believe this is patella femoral syndrome Symptoms should resolve with rest, ice, elevation and naproxen for pain and inflammation.  Wear the knee brace  If symptoms do not improve in the next week or so then you will need to see orthopedics

## 2018-07-27 ENCOUNTER — Emergency Department (HOSPITAL_COMMUNITY)
Admission: EM | Admit: 2018-07-27 | Discharge: 2018-07-27 | Disposition: A | Discharge status: Home / Self Care | Payer: PPO | Attending: Emergency Medicine | Admitting: Emergency Medicine

## 2018-07-27 ENCOUNTER — Other Ambulatory Visit: Payer: Self-pay

## 2018-07-27 ENCOUNTER — Emergency Department (HOSPITAL_COMMUNITY): Payer: PPO

## 2018-07-27 DIAGNOSIS — R0789 Other chest pain: Secondary | ICD-10-CM | POA: Insufficient documentation

## 2018-07-27 DIAGNOSIS — Z79899 Other long term (current) drug therapy: Secondary | ICD-10-CM | POA: Diagnosis not present

## 2018-07-27 DIAGNOSIS — D571 Sickle-cell disease without crisis: Secondary | ICD-10-CM | POA: Insufficient documentation

## 2018-07-27 DIAGNOSIS — R0602 Shortness of breath: Secondary | ICD-10-CM | POA: Insufficient documentation

## 2018-07-27 DIAGNOSIS — R079 Chest pain, unspecified: Secondary | ICD-10-CM

## 2018-07-27 LAB — CBC WITH DIFFERENTIAL/PLATELET
Abs Immature Granulocytes: 0.04 10*3/uL (ref 0.00–0.07)
Basophils Absolute: 0.1 10*3/uL (ref 0.0–0.1)
Basophils Relative: 1 %
Eosinophils Absolute: 0.5 10*3/uL (ref 0.0–0.5)
Eosinophils Relative: 5 %
HCT: 27.9 % — ABNORMAL LOW (ref 39.0–52.0)
Hemoglobin: 10 g/dL — ABNORMAL LOW (ref 13.0–17.0)
Immature Granulocytes: 0 %
Lymphocytes Relative: 30 %
Lymphs Abs: 3 10*3/uL (ref 0.7–4.0)
MCH: 33.7 pg (ref 26.0–34.0)
MCHC: 35.8 g/dL (ref 30.0–36.0)
MCV: 93.9 fL (ref 80.0–100.0)
Monocytes Absolute: 1.3 10*3/uL — ABNORMAL HIGH (ref 0.1–1.0)
Monocytes Relative: 13 %
Neutro Abs: 5.1 10*3/uL (ref 1.7–7.7)
Neutrophils Relative %: 51 %
Platelets: 316 10*3/uL (ref 150–400)
RBC: 2.97 MIL/uL — ABNORMAL LOW (ref 4.22–5.81)
RDW: 20 % — ABNORMAL HIGH (ref 11.5–15.5)
WBC: 10 10*3/uL (ref 4.0–10.5)
nRBC: 1.4 % — ABNORMAL HIGH (ref 0.0–0.2)

## 2018-07-27 LAB — COMPREHENSIVE METABOLIC PANEL
ALT: 32 U/L (ref 0–44)
AST: 45 U/L — ABNORMAL HIGH (ref 15–41)
Albumin: 3.9 g/dL (ref 3.5–5.0)
Alkaline Phosphatase: 50 U/L (ref 38–126)
Anion gap: 7 (ref 5–15)
BUN: 8 mg/dL (ref 6–20)
CO2: 25 mmol/L (ref 22–32)
Calcium: 9.1 mg/dL (ref 8.9–10.3)
Chloride: 105 mmol/L (ref 98–111)
Creatinine, Ser: 0.77 mg/dL (ref 0.61–1.24)
GFR calc Af Amer: >60 mL/min (ref >60)
GFR calc non Af Amer: >60 mL/min (ref >60)
Glucose, Bld: 112 mg/dL — ABNORMAL HIGH (ref 70–99)
Potassium: 4 mmol/L (ref 3.5–5.1)
Sodium: 137 mmol/L (ref 135–145)
Total Bilirubin: 5.3 mg/dL — ABNORMAL HIGH (ref 0.3–1.2)
Total Protein: 7.1 g/dL (ref 6.5–8.1)

## 2018-07-27 LAB — RETICULOCYTES
Immature Retic Fract: 34.8 % — ABNORMAL HIGH (ref 2.3–15.9)
RBC.: 2.97 MIL/uL — ABNORMAL LOW (ref 4.22–5.81)
Retic Count, Absolute: 209.5 10*3/uL — ABNORMAL HIGH (ref 19.0–186.0)
Retic Ct Pct: 6.9 % — ABNORMAL HIGH (ref 0.4–3.1)

## 2018-07-27 LAB — TYPE AND SCREEN
ABO/RH(D): A POS
Antibody Screen: POSITIVE

## 2018-07-27 MED ORDER — HYDROMORPHONE HCL 1 MG/ML IJ SOLN: 0.5000 mg | INTRAMUSCULAR | Status: AC

## 2018-07-27 MED ORDER — HYDROMORPHONE HCL 1 MG/ML IJ SOLN: 1.0000 mg | INTRAMUSCULAR | Status: AC

## 2018-07-27 MED ORDER — HYDROMORPHONE HCL 1 MG/ML IJ SOLN: 1.0000 mg | INTRAMUSCULAR | Status: DC

## 2018-07-27 NOTE — Discharge Instructions (Addendum)
If you develop recurrent, continued, or worsening chest pain, shortness of breath, fever, vomiting, abdominal or back pain, or any other new/concerning symptoms then return to the ER for evaluation.  

## 2018-07-27 NOTE — ED Notes (Signed)
Patient verbalizes understanding of discharge instructions. Opportunity for questioning and answers were provided. Armband removed by staff, pt discharged from ED.  

## 2018-07-27 NOTE — ED Triage Notes (Signed)
Pt reports having sickle cell and has been having chest pain on and off x 3 days along with some sob ; pt denies any cough or fevers

## 2018-07-27 NOTE — ED Provider Notes (Signed)
[facility] Provider Note   CSN: [id number] Arrival date & time: 07/27/18  7124    History   Chief Complaint Chief Complaint  Patient presents with  . Chest Pain    HPI Gregory Espinoza is a 28 y.o. male.     HPI  28 year old male presents with sickle cell pain crisis.  He is had right-sided chest pain for about 3 days.  Called his hematologist oncologist, who is in [place], who recommended he come to the ER.  These pain crises are relatively new for him over the last 1 year.  He has had 2 different episodes similar to this and both times he was diagnosed with acute chest and hospitalized.  He feels short of breath when walking.  No cough, fever.  No leg swelling.  He denies any vomiting.  Pain is about a 4 out of 10.  Past Medical History:  Diagnosis Date  . Sickle cell anemia (HCC)     There are no active problems to display for this patient.   Past Surgical History:  Procedure Laterality Date  . DENTAL SURGERY          Home Medications    Prior to Admission medications   Medication Sig Start Date End Date Taking? Authorizing Provider  folic acid (FOLVITE) 580 MCG tablet Take 400 mcg by mouth daily.    [provider]  hydroxyurea (HYDREA) 500 MG capsule Take 500 mg by mouth daily. May take with food to minimize GI side effects.    [provider]  naproxen (NAPROSYN) 500 MG tablet Take 1 tablet (500 mg total) by mouth 2 (two) times daily. 06/14/18   Loura Halt A, NP  vitamin C (ASCORBIC ACID) 500 MG tablet Take 500 mg by mouth daily.    [provider]    Family History No family history on file.  Social History Social History   Tobacco Use  . Smoking status: Never Smoker  . Smokeless tobacco: Never Used  Substance Use Topics  . Alcohol use: No    Alcohol/week: 0.0 standard drinks  . Drug use: No     Allergies   Patient has no known allergies.   Review of Systems Review of Systems   Constitutional: Negative for fever.  Respiratory: Positive for shortness of breath. Negative for cough.   Cardiovascular: Positive for chest pain.  Gastrointestinal: Negative for abdominal pain and vomiting.  All other systems reviewed and are negative.    Physical Exam Updated Vital Signs BP 131/73 (BP Location: Left Arm)   Pulse 74   Temp 98.1 F (36.7 C) (Oral)   Resp 14   Ht 6\' 4"  (1.93 m)   Wt 94 kg   SpO2 94%   BMI 25.23 kg/m   Physical Exam Vitals signs and nursing note reviewed.  Constitutional:      General: He is not in acute distress.    Appearance: He is well-developed. He is not ill-appearing or diaphoretic.  HENT:     Head: Normocephalic and atraumatic.     Right Ear: External ear normal.     Left Ear: External ear normal.     Nose: Nose normal.  Eyes:     General:        Right eye: No discharge.        Left eye: No discharge.  Neck:     Musculoskeletal: Neck supple.  Cardiovascular:     Rate and Rhythm: Normal rate and regular rhythm.  Heart sounds: Normal heart sounds.  Pulmonary:     Effort: Pulmonary effort is normal.     Breath sounds: Normal breath sounds.  Chest:     Chest wall: No tenderness.  Abdominal:     Palpations: Abdomen is soft.     Tenderness: There is no abdominal tenderness.  Musculoskeletal:     Right lower leg: He exhibits no tenderness. No edema.     Left lower leg: He exhibits no tenderness. No edema.  Skin:    General: Skin is warm and dry.  Neurological:     Mental Status: He is alert.  Psychiatric:        Mood and Affect: Mood is not anxious.      ED Treatments / Results  Labs (all labs ordered are listed, but only abnormal results are displayed) Labs Reviewed  CBC WITH DIFFERENTIAL/PLATELET - Abnormal; Notable for the following components:      Result Value   RBC 2.97 (*)    Hemoglobin 10.0 (*)    HCT 27.9 (*)    RDW 20.0 (*)    nRBC 1.4 (*)    Monocytes Absolute 1.3 (*)    All other components  within normal limits  COMPREHENSIVE METABOLIC PANEL - Abnormal; Notable for the following components:   Glucose, Bld 112 (*)    AST 45 (*)    Total Bilirubin 5.3 (*)    All other components within normal limits  RETICULOCYTES - Abnormal; Notable for the following components:   Retic Ct Pct 6.9 (*)    RBC. 2.97 (*)    Retic Count, Absolute 209.5 (*)    Immature Retic Fract 34.8 (*)    All other components within normal limits  TYPE AND SCREEN    EKG EKG Interpretation  Date/Time:  Saturday July 27 2018 09:17:46 EDT Ventricular Rate:  72 PR Interval:    QRS Duration: 91 QT Interval:  379 QTC Calculation: 415 R Axis:   78 Text Interpretation:  Normal sinus rhythm Ventricular premature complex Probable left ventricular hypertrophy No old tracing to compare Confirmed by Pricilla LovelessGoldston, Jourdan Maldonado [phone]([id number]) on 07/27/2018 9:43:43 AM   Radiology Dg Chest 2 View  Result Date: 07/27/2018 CLINICAL DATA:  Sickle cell disease with pain EXAM: CHEST - 2 VIEW COMPARISON:  November 06, 2005 FINDINGS: There is atelectatic change in the left base. The lungs elsewhere are clear. Heart size and pulmonary vascularity are normal. No adenopathy. There is midthoracic dextroscoliosis. Bony structures otherwise appear unremarkable. IMPRESSION: There is left base atelectasis. No edema or consolidation. No adenopathy. Heart size normal. Midthoracic dextroscoliosis noted. No areas suggesting bone infarct or a vascular necrosis. Electronically Signed   By: Bretta BangWilliam  Woodruff III M.D.   On: 07/27/2018 09:53    Procedures Procedures (including critical care time)  Medications Ordered in ED Medications  HYDROmorphone (DILAUDID) injection 0.5 mg (has no administration in time range)    Or  HYDROmorphone (DILAUDID) injection 0.5 mg (has no administration in time range)  HYDROmorphone (DILAUDID) injection 1 mg (has no administration in time range)    Or  HYDROmorphone (DILAUDID) injection 1 mg (has no administration in time  range)  HYDROmorphone (DILAUDID) injection 1 mg (has no administration in time range)    Or  HYDROmorphone (DILAUDID) injection 1 mg (has no administration in time range)  HYDROmorphone (DILAUDID) injection 1 mg (has no administration in time range)    Or  HYDROmorphone (DILAUDID) injection 1 mg (has no administration in time range)  Initial Impression / Assessment and Plan / ED Course  I have reviewed the triage vital signs and the nursing notes.  Pertinent labs & imaging results that were available during my care of the patient were reviewed by me and considered in my medical decision making (see chart for details).        Labs are overall reassuring.  While he has felt this way with acute chest in the past, he is not hypoxic (sats 96-98% here), afebrile, and does not have consolidation on chest x-ray.  I discussed we could do CT scan but with overall well appearance I think this is less likely to be necessary and we discussed downstream effects of radiation.  He agrees to hold off and would like to take NSAIDs at home and follow-up with his doctor.  Otherwise I discussed he should have a low threshold to return if symptoms do not improve or worsen.  Final Clinical Impressions(s) / ED Diagnoses   Final diagnoses:  Right-sided chest pain    ED Discharge Orders    None       Pricilla LovelessGoldston, Gleason Ardoin, MD 07/27/18 1128

## 2018-07-27 NOTE — ED Notes (Signed)
Patient transported to X-ray 

## 2018-07-27 NOTE — ED Notes (Signed)
Pt states " I am comfortable right now and do not need any pain medication" advised patient that if at any point he needs pain medication , just to notify me

## 2018-10-24 DIAGNOSIS — D5701 Hb-SS disease with acute chest syndrome: Secondary | ICD-10-CM | POA: Insufficient documentation

## 2018-12-13 ENCOUNTER — Encounter: Payer: Self-pay | Admitting: Registered Nurse

## 2018-12-13 ENCOUNTER — Ambulatory Visit (INDEPENDENT_AMBULATORY_CARE_PROVIDER_SITE_OTHER): Payer: PPO | Admitting: Registered Nurse

## 2018-12-13 ENCOUNTER — Other Ambulatory Visit: Payer: Self-pay

## 2018-12-13 VITALS — BP 119/68 | HR 76 | Temp 98.8°F | Resp 18 | Ht 76.0 in | Wt 199.0 lb

## 2018-12-13 DIAGNOSIS — R1031 Right lower quadrant pain: Secondary | ICD-10-CM

## 2018-12-13 DIAGNOSIS — Z7689 Persons encountering health services in other specified circumstances: Secondary | ICD-10-CM | POA: Diagnosis not present

## 2018-12-13 DIAGNOSIS — D649 Anemia, unspecified: Secondary | ICD-10-CM | POA: Insufficient documentation

## 2018-12-13 LAB — POCT CBC
Granulocyte percent: 57.6 %G (ref 37–80)
HCT, POC: 30.5 % (ref 29–41)
Hemoglobin: 10.2 g/dL — AB (ref 11–14.6)
Lymph, poc: 3.2 (ref 0.6–3.4)
MCH, POC: 37.6 pg — AB (ref 27–31.2)
MCHC: 33.5 g/dL (ref 31.8–35.4)
MCV: 112.1 fL — AB (ref 76–111)
MID (cbc): 0.2 (ref 0–0.9)
MPV: 7.4 fL (ref 0–99.8)
POC Granulocyte: 4.6 (ref 2–6.9)
POC LYMPH PERCENT: 39.6 %L (ref 10–50)
POC MID %: 2.8 %M (ref 0–12)
Platelet Count, POC: 149 10*3/uL (ref 142–424)
RBC: 2.72 M/uL — AB (ref 4.69–6.13)
RDW, POC: 20.5 %
WBC: 8 10*3/uL (ref 4.6–10.2)

## 2018-12-13 NOTE — Patient Instructions (Signed)
° ° ° °  If you have lab work done today you will be contacted with your lab results within the next 2 weeks.  If you have not heard from us then please contact us. The fastest way to get your results is to register for My Chart. ° ° °IF you received an x-ray today, you will receive an invoice from [facility]. Please contact [facility] at [phone] with questions or concerns regarding your invoice.  ° °IF you received labwork today, you will receive an invoice from [facility]. Please contact [facility] at [phone] with questions or concerns regarding your invoice.  ° °Our billing staff will not be able to assist you with questions regarding bills from these companies. ° °You will be contacted with the lab results as soon as they are available. The fastest way to get your results is to activate your My Chart account. Instructions are located on the last page of this paperwork. If you have not heard from us regarding the results in 2 weeks, please contact this office. °  ° ° ° °

## 2018-12-17 ENCOUNTER — Telehealth: Payer: Self-pay | Admitting: Registered Nurse

## 2018-12-17 NOTE — Telephone Encounter (Signed)
Copied from [place] [id number]. Topic: General - Other >> Dec 17, 2018  2:24 PM Celene Kras wrote: Reason for CRM: Pt called stating Dr. Orland Mustard stated he would be placing a referral for pt to see a GI doctor. Please advise.

## 2018-12-19 DIAGNOSIS — R1031 Right lower quadrant pain: Secondary | ICD-10-CM | POA: Insufficient documentation

## 2018-12-19 NOTE — Progress Notes (Signed)
New Patient Office Visit  Subjective:  Patient ID: Gregory Espinoza, male    DOB: 07/11/1990  Age: 28 y.o. MRN: [id number]  CC:  Chief Complaint  Patient presents with  . Abdominal Pain    pt states he has been having lower right side abdominal pain for a while and the pain increased about 2 days ago.     HPI Gregory Espinoza presents for LRQ pain. Has been lifelong issue, but recently increasing in frequency and intensity. No changes to bowel habits, diet, or other lifestyle factors. No blood in stool, melena, diarrhea, constipation, or associated symptoms.  Past Medical History:  Diagnosis Date  . Sickle cell anemia (HCC)     Past Surgical History:  Procedure Laterality Date  . DENTAL SURGERY      History reviewed. No pertinent family history.  Social History   Socioeconomic History  . Marital status: Single    Spouse name: Not on file  . Number of children: Not on file  . Years of education: Not on file  . Highest education level: Not on file  Occupational History  . Not on file  Social Needs  . Financial resource strain: Not on file  . Food insecurity    Worry: Not on file    Inability: Not on file  . Transportation needs    Medical: Not on file    Non-medical: Not on file  Tobacco Use  . Smoking status: Never Smoker  . Smokeless tobacco: Never Used  Substance and Sexual Activity  . Alcohol use: No    Alcohol/week: 0.0 standard drinks  . Drug use: No  . Sexual activity: Not on file  Lifestyle  . Physical activity    Days per week: Not on file    Minutes per session: Not on file  . Stress: Not on file  Relationships  . Social Musician on phone: Not on file    Gets together: Not on file    Attends religious service: Not on file    Active member of club or organization: Not on file    Attends meetings of clubs or organizations: Not on file    Relationship status: Not on file  . Intimate partner violence    Fear of current or ex partner:  Not on file    Emotionally abused: Not on file    Physically abused: Not on file    Forced sexual activity: Not on file  Other Topics Concern  . Not on file  Social History Narrative  . Not on file    ROS Review of Systems  Constitutional: Negative.   HENT: Negative.   Eyes: Negative.   Respiratory: Negative.   Cardiovascular: Negative.   Gastrointestinal: Positive for abdominal pain (lrq). Negative for abdominal distention, anal bleeding, blood in stool, constipation, diarrhea, nausea, rectal pain and vomiting.  Endocrine: Negative.   Genitourinary: Negative.   Musculoskeletal: Negative.   Skin: Negative.   Allergic/Immunologic: Negative.   Neurological: Negative.   Hematological: Negative.   Psychiatric/Behavioral: Negative.   All other systems reviewed and are negative.   Objective:   Today's Vitals: BP 119/68   Pulse 76   Temp 98.8 F (37.1 C) (Oral)   Resp 18   Ht 6\' 4"  (1.93 m)   Wt 199 lb (90.3 kg)   SpO2 97%   BMI 24.22 kg/m   Physical Exam Vitals signs and nursing note reviewed.  Constitutional:      General: He is not  in acute distress.    Appearance: He is well-developed and normal weight. He is not ill-appearing, toxic-appearing or diaphoretic.  Abdominal:     General: Abdomen is flat. Bowel sounds are normal. There is no distension or abdominal bruit. There are no signs of injury.     Palpations: Abdomen is soft. There is no shifting dullness, fluid wave, hepatomegaly, splenomegaly, mass or pulsatile mass.     Tenderness: There is abdominal tenderness in the right lower quadrant. There is no right CVA tenderness, left CVA tenderness, guarding or rebound. Negative signs include Murphy's sign, Rovsing's sign, McBurney's sign, psoas sign and obturator sign.  Neurological:     Mental Status: He is alert.     Assessment & Plan:   Problem List Items Addressed This Visit    None    Visit Diagnoses    Abdominal pain, right lower quadrant    -   Primary   Relevant Orders   POCT CBC (Completed)   Ambulatory referral to Gastroenterology      Outpatient Encounter Medications as of 12/13/2018  Medication Sig  . folic acid (FOLVITE) 191 MCG tablet Take by mouth.  . hydroxyurea (HYDREA) 500 MG capsule Take 500 mg by mouth daily. May take with food to minimize GI side effects.  . naproxen (NAPROSYN) 500 MG tablet Take 1 tablet (500 mg total) by mouth 2 (two) times daily.  . pneumococcal 13-valent conjugate vaccine (PREVNAR 13) SUSP injection Inject into the muscle.  . Tdap (BOOSTRIX) 5-2.5-18.5 LF-MCG/0.5 injection Inject into the muscle.  . vitamin C (ASCORBIC ACID) 500 MG tablet Take 500 mg by mouth daily.  . [DISCONTINUED] folic acid (FOLVITE) 478 MCG tablet Take 400 mcg by mouth daily.   No facility-administered encounter medications on file as of 12/13/2018.     Follow-up: No follow-ups on file.   PLAN  No clear etiology to pain - nothing on exam or POCT CBC reveals any concern. Location may suggest appendicitis, but pain is not severe enough and WBCs not elevated - does not suggest further management  Will refer to GI  Patient encouraged to call clinic with any questions, comments, or concerns.   Maximiano Coss, NP

## 2018-12-20 ENCOUNTER — Encounter: Payer: Self-pay | Admitting: Gastroenterology

## 2018-12-31 DIAGNOSIS — D696 Thrombocytopenia, unspecified: Secondary | ICD-10-CM

## 2018-12-31 DIAGNOSIS — D649 Anemia, unspecified: Secondary | ICD-10-CM

## 2018-12-31 DIAGNOSIS — E559 Vitamin D deficiency, unspecified: Secondary | ICD-10-CM

## 2018-12-31 HISTORY — DX: Thrombocytopenia, unspecified: D69.6

## 2018-12-31 HISTORY — DX: Vitamin D deficiency, unspecified: E55.9

## 2018-12-31 HISTORY — DX: Anemia, unspecified: D64.9

## 2019-01-08 ENCOUNTER — Ambulatory Visit (INDEPENDENT_AMBULATORY_CARE_PROVIDER_SITE_OTHER): Payer: PPO | Admitting: Family Medicine

## 2019-01-08 ENCOUNTER — Encounter: Payer: Self-pay | Admitting: Family Medicine

## 2019-01-08 ENCOUNTER — Other Ambulatory Visit: Payer: Self-pay

## 2019-01-08 VITALS — BP 124/66 | HR 83 | Temp 97.7°F | Ht 76.0 in | Wt 199.6 lb

## 2019-01-08 DIAGNOSIS — D571 Sickle-cell disease without crisis: Secondary | ICD-10-CM

## 2019-01-08 DIAGNOSIS — Z7689 Persons encountering health services in other specified circumstances: Secondary | ICD-10-CM | POA: Diagnosis not present

## 2019-01-08 DIAGNOSIS — R1084 Generalized abdominal pain: Secondary | ICD-10-CM | POA: Diagnosis not present

## 2019-01-08 DIAGNOSIS — R109 Unspecified abdominal pain: Secondary | ICD-10-CM

## 2019-01-08 DIAGNOSIS — R10A0 Flank pain, unspecified side: Secondary | ICD-10-CM

## 2019-01-08 DIAGNOSIS — D5701 Hb-SS disease with acute chest syndrome: Secondary | ICD-10-CM

## 2019-01-08 DIAGNOSIS — Z09 Encounter for follow-up examination after completed treatment for conditions other than malignant neoplasm: Secondary | ICD-10-CM

## 2019-01-08 LAB — POCT URINALYSIS DIPSTICK
Bilirubin, UA: NEGATIVE
Blood, UA: NEGATIVE
Glucose, UA: NEGATIVE
Ketones, UA: NEGATIVE
Leukocytes, UA: NEGATIVE
Nitrite, UA: NEGATIVE
Protein, UA: NEGATIVE
Spec Grav, UA: 1.015 (ref 1.010–1.025)
Urobilinogen, UA: 1 E.U./dL
pH, UA: 6 (ref 5.0–8.0)

## 2019-01-08 NOTE — Progress Notes (Signed)
Patient [name]   Established Patient Office Visit  Subjective:  Patient ID: Gregory Espinoza, male    DOB: 1990/03/26  Age: 28 y.o. MRN: [id number]  CC:  Chief Complaint  Patient presents with   Establish Care    Sickle Cell     HPI Gregory Espinoza is a 28 year old male who presents to [facility] today.   Past Medical History:  Diagnosis Date   Sickle cell anemia (HCC)     Current Status: This will be Gregory Espinoza initial office visit with me. He was previously seeing Dr.Chalasani at [facility] in [place], [place] for his PCP needs. He currently has 1 more semester in school in [place] ([employer]) and he will be returning back to his home in [place], [place]. Since his last office visit, he is doing well with no complaints. He states that he occasionally experiences pain in his knees and elbows. He rates his pain today at 2/10. He has not had a hospital visit for Sickle Cell Crisis, with acute chest syndrome, since 03/2018 where he was treated and discharged a few days later. He reports that this was his initial pain crisis. He is currently taking all medications as prescribed and staying well hydrated. He reports occasional nausea, constipation, dizziness and headaches. He has c/o swollen feet at least once monthly, since beginning Hydrea 03/2018. He reports yellow scelera as normal to him. He has begin to have pain in upper torso and bilateral flank area. He also has abdominal pain occasionally, which leads to diarrhea. He occasionally takes Acetaminophen for pain. He is not taking any other pain medications. He denies fevers, chills, fatigue, recent infections, weight loss, and night sweats.  No reports of GI problems such as nausea, vomiting, diarrhea, and constipation. He has no reports of blood in stools, dysuria and hematuria. No depression or anxiety reported today. He denies suicidal ideations, homicidal ideations, or  auditory hallucinations.   Past Surgical History:  Procedure Laterality Date   DENTAL SURGERY      Family History  Problem Relation Age of Onset   Hypertension Father    Sickle cell anemia Brother     Social History   Socioeconomic History   Marital status: Single    Spouse name: Not on file   Number of children: Not on file   Years of education: Not on file   Highest education level: Not on file  Occupational History   Not on file  Tobacco Use   Smoking status: Never Smoker   Smokeless tobacco: Never Used  Substance and Sexual Activity   Alcohol use: No    Alcohol/week: 0.0 standard drinks   Drug use: No   Sexual activity: Yes    Birth control/protection: Condom  Other Topics Concern   Not on file  Social History Narrative   Not on file   Social Determinants of Health   Financial Resource Strain:    Difficulty of Paying Living Expenses: Not on file  Food Insecurity:    Worried About Charity fundraiser in the Last Year: Not on file   [employer] of Food in the Last Year: Not on file  Transportation Needs:    Lack of Transportation (Medical): Not on file   Lack of Transportation (Non-Medical): Not on file  Physical Activity:    Days of Exercise per Week: Not on file   Minutes of Exercise per Session: Not on file  Stress:  Feeling of Stress : Not on file  Social Connections:    Frequency of Communication with Friends and Family: Not on file   Frequency of Social Gatherings with Friends and Family: Not on file   Attends Religious Services: Not on file   Active Member of Clubs or Organizations: Not on file   Attends BankerClub or Organization Meetings: Not on file   Marital Status: Not on file  Intimate Partner Violence:    Fear of Current or Ex-Partner: Not on file   Emotionally Abused: Not on file   Physically Abused: Not on file   Sexually Abused: Not on file    Outpatient Medications Prior to Visit  Medication Sig Dispense  Refill   folic acid (FOLVITE) 800 MCG tablet Take by mouth.     hydroxyurea (HYDREA) 500 MG capsule Take 500 mg by mouth daily. May take with food to minimize GI side effects.     naproxen (NAPROSYN) 500 MG tablet Take 1 tablet (500 mg total) by mouth 2 (two) times daily. 30 tablet 0   vitamin C (ASCORBIC ACID) 500 MG tablet Take 500 mg by mouth daily.     pneumococcal 13-valent conjugate vaccine (PREVNAR 13) SUSP injection Inject into the muscle.     Tdap (BOOSTRIX) 5-2.5-18.5 LF-MCG/0.5 injection Inject into the muscle.     No facility-administered medications prior to visit.    No Known Allergies  ROS Review of Systems  Constitutional: Negative.   HENT: Negative.   Eyes: Negative.   Respiratory: Negative.   Cardiovascular: Negative.   Gastrointestinal: Positive for nausea (occasional).       Flank pain  Endocrine: Negative.   Genitourinary: Negative.   Musculoskeletal: Positive for arthralgias (generalized joint pain).  Skin: Negative.   Allergic/Immunologic: Negative.   Neurological: Positive for dizziness (occasional) and headaches (occasional ).  Hematological: Negative.   Psychiatric/Behavioral: Negative.     Objective:    Physical Exam  Constitutional: He is oriented to person, place, and time. He appears well-developed and well-nourished.  HENT:  Head: Normocephalic and atraumatic.  Eyes: Conjunctivae are normal.    Neck: Normal range of motion. Neck supple.  Cardiovascular: Normal rate, regular rhythm, normal heart sounds and intact distal pulses.  Pulmonary/Chest: Effort normal and breath sounds normal.  Abdominal: Soft. Bowel sounds are normal.  Musculoskeletal: Normal range of motion.  Neurological: He is alert and oriented to person, place, and time. He has normal reflexes.  Skin: Skin is warm and dry.  Psychiatric: He has a normal mood and affect. His behavior is normal. Judgment and thought content normal.  Nursing note and vitals  reviewed.   BP 124/66    Pulse 83    Temp 97.7 F (36.5 C) (Oral)    Ht 6\' 4"  (1.93 m)    Wt 199 lb 9.6 oz (90.5 kg)    SpO2 95%    BMI 24.30 kg/m  Wt Readings from Last 3 Encounters:  01/08/19 199 lb 9.6 oz (90.5 kg)  12/13/18 199 lb (90.3 kg)  07/27/18 207 lb 3.7 oz (94 kg)     Health Maintenance Due  Topic Date Due   HIV Screening  12/17/2006   TETANUS/TDAP  12/17/2010    There are no preventive care reminders to display for this patient.  Lab Results  Component Value Date   TSH 4.339 08/16/2011   Lab Results  Component Value Date   WBC 5.8 01/08/2019   HGB 9.0 (L) 01/08/2019   HCT 25.9 (L) 01/08/2019  MCV 111 (H) 01/08/2019   PLT 110 (L) 01/08/2019   Lab Results  Component Value Date   NA 140 01/08/2019   K 4.1 01/08/2019   CO2 24 01/08/2019   GLUCOSE 86 01/08/2019   BUN 6 01/08/2019   CREATININE 0.63 (L) 01/08/2019   BILITOT 4.4 (H) 01/08/2019   ALKPHOS 55 01/08/2019   AST 28 01/08/2019   ALT 12 01/08/2019   PROT 7.4 01/08/2019   ALBUMIN 4.3 01/08/2019   CALCIUM 9.3 01/08/2019   ANIONGAP 7 07/27/2018   Lab Results  Component Value Date   CHOL 87 08/16/2011   Lab Results  Component Value Date   HDL 31 (L) 08/16/2011   Lab Results  Component Value Date   LDLCALC 39 08/16/2011   Lab Results  Component Value Date   TRIG 85 08/16/2011   Lab Results  Component Value Date   CHOLHDL 2.8 08/16/2011   No results found for: HGBA1C    Assessment & Plan:   1. Encounter to establish care  2. Sickle cell disease, type SS (HCC) He is doing well today. He will continue to take all medications as prescribed; will continue to avoid extreme heat and cold; will continue to eat a healthy diet and drink at least 64 ounces of water daily; continue stool softener as needed; will avoid colds and flu; will continue to get plenty of sleep and rest; will continue to avoid high stressful situations and remain infection free; will continue Folic Acid 1 mg daily  to avoid sickle cell crisis. Continue to follow up with Hematologist as needed.  - Urinalysis Dipstick - Hemoglobinopathy evaluation - Sickle Cell Panel  3. Hb-SS disease with acute chest syndrome (HCC)  4. Generalized abdominal pain  5. Flank pain  6. Follow up He will follow up in 2 months.   No orders of the defined types were placed in this encounter.   Orders Placed This Encounter  Procedures   Hemoglobinopathy evaluation   Sickle Cell Panel   Urinalysis Dipstick    Referral Orders  No referral(s) requested today    Raliegh Ip,  MSN, FNP-BC [facility] [address] [place], [place] [address] [phone]- fax  Problem List Items Addressed This Visit      Respiratory   Hb-SS disease with acute chest syndrome (HCC)     Other   Sickle cell disease, type SS (HCC)   Relevant Orders   Urinalysis Dipstick (Completed)   Hemoglobinopathy evaluation (Completed)   Sickle Cell Panel (Completed)    Other Visit Diagnoses    Encounter to establish care    -  Primary   Generalized abdominal pain       Flank pain       Follow up          No orders of the defined types were placed in this encounter.   Follow-up: Return in about 1 month (around 02/08/2019).    Kallie Locks, FNP

## 2019-01-14 LAB — CMP14+CBC/D/PLT+FER+RETIC+V...
ALT: 12 IU/L (ref 0–44)
AST: 28 IU/L (ref 0–40)
Albumin/Globulin Ratio: 1.4 (ref 1.2–2.2)
Albumin: 4.3 g/dL (ref 4.1–5.2)
Alkaline Phosphatase: 55 IU/L (ref 39–117)
BUN/Creatinine Ratio: 10 (ref 9–20)
BUN: 6 mg/dL (ref 6–20)
Basophils Absolute: 0 10*3/uL (ref 0.0–0.2)
Basos: 1 %
Bilirubin Total: 4.4 mg/dL — ABNORMAL HIGH (ref 0.0–1.2)
CO2: 24 mmol/L (ref 20–29)
Calcium: 9.3 mg/dL (ref 8.7–10.2)
Chloride: 105 mmol/L (ref 96–106)
Creatinine, Ser: 0.63 mg/dL — ABNORMAL LOW (ref 0.76–1.27)
EOS (ABSOLUTE): 0.2 10*3/uL (ref 0.0–0.4)
Eos: 3 %
Ferritin: 171 ng/mL (ref 30–400)
GFR calc Af Amer: 156 mL/min/{1.73_m2} (ref >59)
GFR calc non Af Amer: 135 mL/min/{1.73_m2} (ref >59)
Globulin, Total: 3.1 g/dL (ref 1.5–4.5)
Glucose: 86 mg/dL (ref 65–99)
Hematocrit: 25.9 % — ABNORMAL LOW (ref 37.5–51.0)
Hemoglobin: 9 g/dL — ABNORMAL LOW (ref 13.0–17.7)
Immature Grans (Abs): 0 10*3/uL (ref 0.0–0.1)
Immature Granulocytes: 0 %
Lymphocytes Absolute: 2.7 10*3/uL (ref 0.7–3.1)
Lymphs: 46 %
MCH: 38.6 pg — ABNORMAL HIGH (ref 26.6–33.0)
MCHC: 34.7 g/dL (ref 31.5–35.7)
MCV: 111 fL — ABNORMAL HIGH (ref 79–97)
Monocytes Absolute: 0.8 10*3/uL (ref 0.1–0.9)
Monocytes: 14 %
NRBC: 5 % — ABNORMAL HIGH (ref 0–0)
Neutrophils Absolute: 2.1 10*3/uL (ref 1.4–7.0)
Neutrophils: 36 %
Platelets: 110 10*3/uL — ABNORMAL LOW (ref 150–450)
Potassium: 4.1 mmol/L (ref 3.5–5.2)
RBC: 2.33 x10E6/uL — CL (ref 4.14–5.80)
RDW: 18.4 % — ABNORMAL HIGH (ref 11.6–15.4)
Retic Ct Pct: 4.5 % — ABNORMAL HIGH (ref 0.6–2.6)
Sodium: 140 mmol/L (ref 134–144)
Total Protein: 7.4 g/dL (ref 6.0–8.5)
Vit D, 25-Hydroxy: 21.6 ng/mL — ABNORMAL LOW (ref 30.0–100.0)
WBC: 5.8 10*3/uL (ref 3.4–10.8)

## 2019-01-14 LAB — HEMOGLOBINOPATHY EVALUATION
HGB C: 0 %
HGB S: 80 % — ABNORMAL HIGH
HGB VARIANT: 0 %
Hemoglobin A2 Quantitation: 4.3 % — ABNORMAL HIGH (ref 1.8–3.2)
Hemoglobin F Quantitation: 15.7 % — ABNORMAL HIGH (ref 0.0–2.0)
Hgb A: 0 % — ABNORMAL LOW (ref 96.4–98.8)

## 2019-01-20 ENCOUNTER — Other Ambulatory Visit: Payer: Self-pay | Admitting: Family Medicine

## 2019-01-20 ENCOUNTER — Encounter: Payer: Self-pay | Admitting: Family Medicine

## 2019-01-20 DIAGNOSIS — D5701 Hb-SS disease with acute chest syndrome: Secondary | ICD-10-CM

## 2019-01-20 DIAGNOSIS — D649 Anemia, unspecified: Secondary | ICD-10-CM

## 2019-01-20 DIAGNOSIS — D571 Sickle-cell disease without crisis: Secondary | ICD-10-CM

## 2019-01-20 DIAGNOSIS — D696 Thrombocytopenia, unspecified: Secondary | ICD-10-CM

## 2019-01-20 DIAGNOSIS — E569 Vitamin deficiency, unspecified: Secondary | ICD-10-CM

## 2019-01-20 MED ORDER — VITAMIN D (ERGOCALCIFEROL) 1.25 MG (50000 UNIT) PO CAPS: 50000.0000 [IU] | ORAL_CAPSULE | ORAL | 3 refills | Status: DC

## 2019-01-22 ENCOUNTER — Telehealth: Payer: Self-pay

## 2019-01-22 NOTE — Telephone Encounter (Signed)
Contacted pt to go over lab results pt is aware and is schedule for a lab appointment for 12/28. Pt doesn't have any questions or concerns

## 2019-01-27 ENCOUNTER — Other Ambulatory Visit: Payer: Self-pay

## 2019-01-27 ENCOUNTER — Other Ambulatory Visit: Payer: PPO

## 2019-01-27 DIAGNOSIS — D696 Thrombocytopenia, unspecified: Secondary | ICD-10-CM

## 2019-01-27 DIAGNOSIS — D571 Sickle-cell disease without crisis: Secondary | ICD-10-CM

## 2019-01-27 DIAGNOSIS — D649 Anemia, unspecified: Secondary | ICD-10-CM

## 2019-01-28 LAB — CBC WITH DIFFERENTIAL/PLATELET
Basophils Absolute: 0 10*3/uL (ref 0.0–0.2)
Basos: 1 %
EOS (ABSOLUTE): 0.1 10*3/uL (ref 0.0–0.4)
Eos: 2 %
Hematocrit: 27.2 % — ABNORMAL LOW (ref 37.5–51.0)
Hemoglobin: 9.6 g/dL — ABNORMAL LOW (ref 13.0–17.7)
Immature Grans (Abs): 0 10*3/uL (ref 0.0–0.1)
Immature Granulocytes: 0 %
Lymphocytes Absolute: 2.9 10*3/uL (ref 0.7–3.1)
Lymphs: 42 %
MCH: 39.2 pg — ABNORMAL HIGH (ref 26.6–33.0)
MCHC: 35.3 g/dL (ref 31.5–35.7)
MCV: 111 fL — ABNORMAL HIGH (ref 79–97)
Monocytes Absolute: 0.8 10*3/uL (ref 0.1–0.9)
Monocytes: 12 %
NRBC: 1 % — ABNORMAL HIGH (ref 0–0)
Neutrophils Absolute: 3 10*3/uL (ref 1.4–7.0)
Neutrophils: 43 %
Platelets: 299 10*3/uL (ref 150–450)
RBC: 2.45 x10E6/uL — CL (ref 4.14–5.80)
RDW: 16.7 % — ABNORMAL HIGH (ref 11.6–15.4)
WBC: 6.9 10*3/uL (ref 3.4–10.8)

## 2019-01-29 ENCOUNTER — Ambulatory Visit: Payer: PPO | Admitting: Gastroenterology

## 2019-02-02 ENCOUNTER — Other Ambulatory Visit: Payer: Self-pay | Admitting: Family Medicine

## 2019-02-02 DIAGNOSIS — D649 Anemia, unspecified: Secondary | ICD-10-CM

## 2019-02-02 DIAGNOSIS — D571 Sickle-cell disease without crisis: Secondary | ICD-10-CM

## 2019-02-04 ENCOUNTER — Other Ambulatory Visit: Payer: PPO

## 2019-02-04 ENCOUNTER — Other Ambulatory Visit: Payer: Self-pay

## 2019-02-04 DIAGNOSIS — D571 Sickle-cell disease without crisis: Secondary | ICD-10-CM

## 2019-02-04 DIAGNOSIS — D649 Anemia, unspecified: Secondary | ICD-10-CM

## 2019-02-05 LAB — CBC WITH DIFFERENTIAL/PLATELET
Basophils Absolute: 0.1 10*3/uL (ref 0.0–0.2)
Basos: 1 %
EOS (ABSOLUTE): 0.2 10*3/uL (ref 0.0–0.4)
Eos: 3 %
Hematocrit: 27 % — ABNORMAL LOW (ref 37.5–51.0)
Hemoglobin: 9.6 g/dL — ABNORMAL LOW (ref 13.0–17.7)
Immature Grans (Abs): 0 10*3/uL (ref 0.0–0.1)
Immature Granulocytes: 0 %
Lymphocytes Absolute: 3 10*3/uL (ref 0.7–3.1)
Lymphs: 47 %
MCH: 38.6 pg — ABNORMAL HIGH (ref 26.6–33.0)
MCHC: 35.6 g/dL (ref 31.5–35.7)
MCV: 108 fL — ABNORMAL HIGH (ref 79–97)
Monocytes Absolute: 0.8 10*3/uL (ref 0.1–0.9)
Monocytes: 13 %
NRBC: 2 % — ABNORMAL HIGH (ref 0–0)
Neutrophils Absolute: 2.3 10*3/uL (ref 1.4–7.0)
Neutrophils: 36 %
Platelets: 124 10*3/uL — ABNORMAL LOW (ref 150–450)
RBC: 2.49 x10E6/uL — CL (ref 4.14–5.80)
RDW: 16.7 % — ABNORMAL HIGH (ref 11.6–15.4)
WBC: 6.3 10*3/uL (ref 3.4–10.8)

## 2019-02-11 ENCOUNTER — Other Ambulatory Visit: Payer: Self-pay

## 2019-02-11 ENCOUNTER — Ambulatory Visit (INDEPENDENT_AMBULATORY_CARE_PROVIDER_SITE_OTHER): Payer: PPO | Admitting: Family Medicine

## 2019-02-11 ENCOUNTER — Encounter: Payer: Self-pay | Admitting: Family Medicine

## 2019-02-11 VITALS — BP 114/64 | HR 60 | Temp 97.6°F | Ht 76.0 in | Wt 199.0 lb

## 2019-02-11 DIAGNOSIS — G8929 Other chronic pain: Secondary | ICD-10-CM | POA: Diagnosis not present

## 2019-02-11 DIAGNOSIS — D571 Sickle-cell disease without crisis: Secondary | ICD-10-CM | POA: Diagnosis not present

## 2019-02-11 DIAGNOSIS — Z09 Encounter for follow-up examination after completed treatment for conditions other than malignant neoplasm: Secondary | ICD-10-CM

## 2019-02-11 LAB — POCT URINALYSIS DIPSTICK
Bilirubin, UA: NEGATIVE
Glucose, UA: NEGATIVE
Ketones, UA: NEGATIVE
Leukocytes, UA: NEGATIVE
Nitrite, UA: NEGATIVE
Protein, UA: NEGATIVE
Spec Grav, UA: 1.015 (ref 1.010–1.025)
Urobilinogen, UA: 1 E.U./dL
pH, UA: 6.5 (ref 5.0–8.0)

## 2019-02-11 NOTE — Patient Instructions (Signed)
Heart-Healthy Eating Plan Heart-healthy meal planning includes:  Eating less unhealthy fats.  Eating more healthy fats.  Making other changes in your diet. Talk with your doctor or a diet specialist (dietitian) to create an eating plan that is right for you. What is my plan? Your doctor may recommend an eating plan that includes:  Total fat: ______% or less of total calories a day.  Saturated fat: ______% or less of total calories a day.  Cholesterol: less than _________mg a day. What are tips for following this plan? Cooking Avoid frying your food. Try to bake, boil, grill, or broil it instead. You can also reduce fat by:  Removing the skin from poultry.  Removing all visible fats from meats.  Steaming vegetables in water or broth. Meal planning   At meals, divide your plate into four equal parts: ? Fill one-half of your plate with vegetables and green salads. ? Fill one-fourth of your plate with whole grains. ? Fill one-fourth of your plate with lean protein foods.  Eat 4-5 servings of vegetables per day. A serving of vegetables is: ? 1 cup of raw or cooked vegetables. ? 2 cups of raw leafy greens.  Eat 4-5 servings of fruit per day. A serving of fruit is: ? 1 medium whole fruit. ?  cup of dried fruit. ?  cup of fresh, frozen, or canned fruit. ?  cup of 100% fruit juice.  Eat more foods that have soluble fiber. These are apples, broccoli, carrots, beans, peas, and barley. Try to get 20-30 g of fiber per day.  Eat 4-5 servings of nuts, legumes, and seeds per week: ? 1 serving of dried beans or legumes equals  cup after being cooked. ? 1 serving of nuts is  cup. ? 1 serving of seeds equals 1 tablespoon. General information  Eat more home-cooked food. Eat less restaurant, buffet, and fast food.  Limit or avoid alcohol.  Limit foods that are high in starch and sugar.  Avoid fried foods.  Lose weight if you are overweight.  Keep track of how much salt  (sodium) you eat. This is important if you have high blood pressure. Ask your doctor to tell you more about this.  Try to add vegetarian meals each week. Fats  Choose healthy fats. These include olive oil and canola oil, flaxseeds, walnuts, almonds, and seeds.  Eat more omega-3 fats. These include salmon, mackerel, sardines, tuna, flaxseed oil, and ground flaxseeds. Try to eat fish at least 2 times each week.  Check food labels. Avoid foods with trans fats or high amounts of saturated fat.  Limit saturated fats. ? These are often found in animal products, such as meats, butter, and cream. ? These are also found in plant foods, such as palm oil, palm kernel oil, and coconut oil.  Avoid foods with partially hydrogenated oils in them. These have trans fats. Examples are stick margarine, some tub margarines, cookies, crackers, and other baked goods. What foods can I eat? Fruits All fresh, canned (in natural juice), or frozen fruits. Vegetables Fresh or frozen vegetables (raw, steamed, roasted, or grilled). Green salads. Grains Most grains. Choose whole wheat and whole grains most of the time. Rice and pasta, including brown rice and pastas made with whole wheat. Meats and other proteins Lean, well-trimmed beef, veal, pork, and lamb. Chicken and turkey without skin. All fish and shellfish. Wild duck, rabbit, pheasant, and venison. Egg whites or low-cholesterol egg substitutes. Dried beans, peas, lentils, and tofu. Seeds and most   nuts. Dairy Low-fat or nonfat cheeses, including ricotta and mozzarella. Skim or 1% milk that is liquid, powdered, or evaporated. Buttermilk that is made with low-fat milk. Nonfat or low-fat yogurt. Fats and oils Non-hydrogenated (trans-free) margarines. Vegetable oils, including soybean, sesame, sunflower, olive, peanut, safflower, corn, canola, and cottonseed. Salad dressings or mayonnaise made with a vegetable oil. Beverages Mineral water. Coffee and tea. Diet  carbonated beverages. Sweets and desserts Sherbet, gelatin, and fruit ice. Small amounts of dark chocolate. Limit all sweets and desserts. Seasonings and condiments All seasonings and condiments. The items listed above may not be a complete list of foods and drinks you can eat. Contact a dietitian for more options. What foods should I avoid? Fruits Canned fruit in heavy syrup. Fruit in cream or butter sauce. Fried fruit. Limit coconut. Vegetables Vegetables cooked in cheese, cream, or butter sauce. Fried vegetables. Grains Breads that are made with saturated or trans fats, oils, or whole milk. Croissants. Sweet rolls. Donuts. High-fat crackers, such as cheese crackers. Meats and other proteins Fatty meats, such as hot dogs, ribs, sausage, bacon, rib-eye roast or steak. High-fat deli meats, such as salami and bologna. Caviar. Domestic duck and goose. Organ meats, such as liver. Dairy Cream, sour cream, cream cheese, and creamed cottage cheese. Whole-milk cheeses. Whole or 2% milk that is liquid, evaporated, or condensed. Whole buttermilk. Cream sauce or high-fat cheese sauce. Yogurt that is made from whole milk. Fats and oils Meat fat, or shortening. Cocoa butter, hydrogenated oils, palm oil, coconut oil, palm kernel oil. Solid fats and shortenings, including bacon fat, salt pork, lard, and butter. Nondairy cream substitutes. Salad dressings with cheese or sour cream. Beverages Regular sodas and juice drinks with added sugar. Sweets and desserts Frosting. Pudding. Cookies. Cakes. Pies. Milk chocolate or white chocolate. Buttered syrups. Full-fat ice cream or ice cream drinks. The items listed above may not be a complete list of foods and drinks to avoid. Contact a dietitian for more information. Summary  Heart-healthy meal planning includes eating less unhealthy fats, eating more healthy fats, and making other changes in your diet.  Eat a balanced diet. This includes fruits and  vegetables, low-fat or nonfat dairy, lean protein, nuts and legumes, whole grains, and heart-healthy oils and fats. This information is not intended to replace advice given to you by your health care provider. Make sure you discuss any questions you have with your health care provider. Document Revised: 03/22/2017 Document Reviewed: 02/23/2017 Elsevier Patient Education  2020 Elsevier Inc. DASH Eating Plan DASH stands for "Dietary Approaches to Stop Hypertension." The DASH eating plan is a healthy eating plan that has been shown to reduce high blood pressure (hypertension). It may also reduce your risk for type 2 diabetes, heart disease, and stroke. The DASH eating plan may also help with weight loss. What are tips for following this plan?  General guidelines  Avoid eating more than 2,300 mg (milligrams) of salt (sodium) a day. If you have hypertension, you may need to reduce your sodium intake to 1,500 mg a day.  Limit alcohol intake to no more than 1 drink a day for nonpregnant women and 2 drinks a day for men. One drink equals 12 oz of beer, 5 oz of wine, or 1 oz of hard liquor.  Work with your health care provider to maintain a healthy body weight or to lose weight. Ask what an ideal weight is for you.  Get at least 30 minutes of exercise that causes your heart to beat   faster (aerobic exercise) most days of the week. Activities may include walking, swimming, or biking.  Work with your health care provider or diet and nutrition specialist (dietitian) to adjust your eating plan to your individual calorie needs. Reading food labels   Check food labels for the amount of sodium per serving. Choose foods with less than 5 percent of the Daily Value of sodium. Generally, foods with less than 300 mg of sodium per serving fit into this eating plan.  To find whole grains, look for the word "whole" as the first word in the ingredient list. Shopping  Buy products labeled as "low-sodium" or "no  salt added."  Buy fresh foods. Avoid canned foods and premade or frozen meals. Cooking  Avoid adding salt when cooking. Use salt-free seasonings or herbs instead of table salt or sea salt. Check with your health care provider or pharmacist before using salt substitutes.  Do not fry foods. Cook foods using healthy methods such as baking, boiling, grilling, and broiling instead.  Cook with heart-healthy oils, such as olive, canola, soybean, or sunflower oil. Meal planning  Eat a balanced diet that includes: ? 5 or more servings of fruits and vegetables each day. At each meal, try to fill half of your plate with fruits and vegetables. ? Up to 6-8 servings of whole grains each day. ? Less than 6 oz of lean meat, poultry, or fish each day. A 3-oz serving of meat is about the same size as a deck of cards. One egg equals 1 oz. ? 2 servings of low-fat dairy each day. ? A serving of nuts, seeds, or beans 5 times each week. ? Heart-healthy fats. Healthy fats called Omega-3 fatty acids are found in foods such as flaxseeds and coldwater fish, like sardines, salmon, and mackerel.  Limit how much you eat of the following: ? Canned or prepackaged foods. ? Food that is high in trans fat, such as fried foods. ? Food that is high in saturated fat, such as fatty meat. ? Sweets, desserts, sugary drinks, and other foods with added sugar. ? Full-fat dairy products.  Do not salt foods before eating.  Try to eat at least 2 vegetarian meals each week.  Eat more home-cooked food and less restaurant, buffet, and fast food.  When eating at a restaurant, ask that your food be prepared with less salt or no salt, if possible. What foods are recommended? The items listed may not be a complete list. Talk with your dietitian about what dietary choices are best for you. Grains Whole-grain or whole-wheat bread. Whole-grain or whole-wheat pasta. Brown rice. Oatmeal. Quinoa. Bulgur. Whole-grain and low-sodium  cereals. Pita bread. Low-fat, low-sodium crackers. Whole-wheat flour tortillas. Vegetables Fresh or frozen vegetables (raw, steamed, roasted, or grilled). Low-sodium or reduced-sodium tomato and vegetable juice. Low-sodium or reduced-sodium tomato sauce and tomato paste. Low-sodium or reduced-sodium canned vegetables. Fruits All fresh, dried, or frozen fruit. Canned fruit in natural juice (without added sugar). Meat and other protein foods Skinless chicken or turkey. Ground chicken or turkey. Pork with fat trimmed off. Fish and seafood. Egg whites. Dried beans, peas, or lentils. Unsalted nuts, nut butters, and seeds. Unsalted canned beans. Lean cuts of beef with fat trimmed off. Low-sodium, lean deli meat. Dairy Low-fat (1%) or fat-free (skim) milk. Fat-free, low-fat, or reduced-fat cheeses. Nonfat, low-sodium ricotta or cottage cheese. Low-fat or nonfat yogurt. Low-fat, low-sodium cheese. Fats and oils Soft margarine without trans fats. Vegetable oil. Low-fat, reduced-fat, or light mayonnaise and salad dressings (reduced-sodium).   Canola, safflower, olive, soybean, and sunflower oils. Avocado. Seasoning and other foods Herbs. Spices. Seasoning mixes without salt. Unsalted popcorn and pretzels. Fat-free sweets. What foods are not recommended? The items listed may not be a complete list. Talk with your dietitian about what dietary choices are best for you. Grains Baked goods made with fat, such as croissants, muffins, or some breads. Dry pasta or rice meal packs. Vegetables Creamed or fried vegetables. Vegetables in a cheese sauce. Regular canned vegetables (not low-sodium or reduced-sodium). Regular canned tomato sauce and paste (not low-sodium or reduced-sodium). Regular tomato and vegetable juice (not low-sodium or reduced-sodium). Pickles. Olives. Fruits Canned fruit in a light or heavy syrup. Fried fruit. Fruit in cream or butter sauce. Meat and other protein foods Fatty cuts of meat. Ribs.  Fried meat. Bacon. Sausage. Bologna and other processed lunch meats. Salami. Fatback. Hotdogs. Bratwurst. Salted nuts and seeds. Canned beans with added salt. Canned or smoked fish. Whole eggs or egg yolks. Chicken or turkey with skin. Dairy Whole or 2% milk, cream, and half-and-half. Whole or full-fat cream cheese. Whole-fat or sweetened yogurt. Full-fat cheese. Nondairy creamers. Whipped toppings. Processed cheese and cheese spreads. Fats and oils Butter. Stick margarine. Lard. Shortening. Ghee. Bacon fat. Tropical oils, such as coconut, palm kernel, or palm oil. Seasoning and other foods Salted popcorn and pretzels. Onion salt, garlic salt, seasoned salt, table salt, and sea salt. Worcestershire sauce. Tartar sauce. Barbecue sauce. Teriyaki sauce. Soy sauce, including reduced-sodium. Steak sauce. Canned and packaged gravies. Fish sauce. Oyster sauce. Cocktail sauce. Horseradish that you find on the shelf. Ketchup. Mustard. Meat flavorings and tenderizers. Bouillon cubes. Hot sauce and Tabasco sauce. Premade or packaged marinades. Premade or packaged taco seasonings. Relishes. Regular salad dressings. Where to find more information:  National Heart, Lung, and Blood Institute: www.nhlbi.nih.gov  American Heart Association: www.heart.org Summary  The DASH eating plan is a healthy eating plan that has been shown to reduce high blood pressure (hypertension). It may also reduce your risk for type 2 diabetes, heart disease, and stroke.  With the DASH eating plan, you should limit salt (sodium) intake to 2,300 mg a day. If you have hypertension, you may need to reduce your sodium intake to 1,500 mg a day.  When on the DASH eating plan, aim to eat more fresh fruits and vegetables, whole grains, lean proteins, low-fat dairy, and heart-healthy fats.  Work with your health care provider or diet and nutrition specialist (dietitian) to adjust your eating plan to your individual calorie needs. This  information is not intended to replace advice given to you by your health care provider. Make sure you discuss any questions you have with your health care provider. Document Revised: 12/29/2016 Document Reviewed: 01/10/2016 Elsevier Patient Education  2020 Elsevier Inc.  

## 2019-02-11 NOTE — Progress Notes (Signed)
[facility]   Established Patient Office Visit  Subjective:  Patient ID: Gregory Espinoza, male    DOB: [birth year]  Age: 29 y.o. MRN: [id number]  CC:  Chief Complaint  Patient presents with  . Follow-up    Sickle Cell    HPI Gregory Espinoza is a 29 year old male who presents for Follow Up today.   Past Medical History:  Diagnosis Date  . Erythrocytopenia 12/2018  . Hemoglobin S-S disease (HCC)   . Sickle cell anemia (HCC)   . Thrombocytopenia (HCC) 12/2018  . Vitamin D deficiency 12/2018   Current Status: Since his last office visit, he is doing well with no complaints. He states that he has pain in his arms and legs. He rates his pain today at 0/10. He has not had a hospital visit for Sickle Cell Crisis since. He where he was treated and discharged the same day. He is currently taking all medications as prescribed and staying well hydrated. He reports occasional nausea, constipation, dizziness and headaches. He denies fevers, chills, fatigue, recent infections, weight loss, and night sweats. He has not had any visual changes, and falls. No chest pain, heart palpitations, cough and shortness of breath reported. No reports of GI problems such as nausea, vomiting, diarrhea, and constipation. He has no reports of blood in stools, dysuria and hematuria. No depression or anxiety reported today. He denies suicidal ideations, homicidal ideations, or auditory hallucinations. He denies pain today.   Past Surgical History:  Procedure Laterality Date  . DENTAL SURGERY      Family History  Problem Relation Age of Onset  . Hypertension Father   . Sickle cell anemia Brother     Social History   Socioeconomic History  . Marital status: Single    Spouse name: Not on file  . Number of children: Not on file  . Years of education: Not on file  . Highest education level: Not on file  Occupational History  . Not on file  Tobacco Use  .  Smoking status: Never Smoker  . Smokeless tobacco: Never Used  Substance and Sexual Activity  . Alcohol use: No    Alcohol/week: 0.0 standard drinks  . Drug use: No  . Sexual activity: Yes    Birth control/protection: Condom  Other Topics Concern  . Not on file  Social History Narrative  . Not on file   Social Determinants of Health   Financial Resource Strain:   . Difficulty of Paying Living Expenses: Not on file  Food Insecurity:   . Worried About Programme researcher, broadcasting/film/video in the Last Year: Not on file  . Ran Out of Food in the Last Year: Not on file  Transportation Needs:   . Lack of Transportation (Medical): Not on file  . Lack of Transportation (Non-Medical): Not on file  Physical Activity:   . Days of Exercise per Week: Not on file  . Minutes of Exercise per Session: Not on file  Stress:   . Feeling of Stress : Not on file  Social Connections:   . Frequency of Communication with Friends and Family: Not on file  . Frequency of Social Gatherings with Friends and Family: Not on file  . Attends Religious Services: Not on file  . Active Member of Clubs or Organizations: Not on file  . Attends Banker Meetings: Not on file  . Marital Status: Not on file  Intimate Partner Violence:   . Fear  of Current or Ex-Partner: Not on file  . Emotionally Abused: Not on file  . Physically Abused: Not on file  . Sexually Abused: Not on file    Outpatient Medications Prior to Visit  Medication Sig Dispense Refill  . folic acid (FOLVITE) 800 MCG tablet Take by mouth.    . hydroxyurea (HYDREA) 500 MG capsule Take 500 mg by mouth daily. May take with food to minimize GI side effects.    . naproxen (NAPROSYN) 500 MG tablet Take 1 tablet (500 mg total) by mouth 2 (two) times daily. 30 tablet 0  . vitamin C (ASCORBIC ACID) 500 MG tablet Take 500 mg by mouth daily.    . Vitamin D, Ergocalciferol, (DRISDOL) 1.25 MG (50000 UT) CAPS capsule Take 1 capsule (50,000 Units total) by mouth  every 7 (seven) days. 5 capsule 3   No facility-administered medications prior to visit.    No Known Allergies  ROS Review of Systems  Constitutional: Negative.   HENT: Negative.   Eyes: Negative.   Respiratory: Negative.   Cardiovascular: Negative.   Gastrointestinal: Negative.   Endocrine: Negative.   Genitourinary: Negative.   Musculoskeletal: Positive for arthralgias (generalized joint pain).  Skin: Negative.   Allergic/Immunologic: Negative.   Neurological: Negative.   Hematological: Negative.   Psychiatric/Behavioral: Negative.       Objective:    Physical Exam  Constitutional: He is oriented to person, place, and time. He appears well-developed and well-nourished.  HENT:  Head: Normocephalic and atraumatic.  Eyes: Conjunctivae are normal.  Cardiovascular: Normal rate, regular rhythm, normal heart sounds and intact distal pulses.  Pulmonary/Chest: Effort normal and breath sounds normal.  Abdominal: Soft. Bowel sounds are normal.  Musculoskeletal:        General: Normal range of motion.     Cervical back: Normal range of motion and neck supple.  Neurological: He is alert and oriented to person, place, and time. He has normal reflexes.  Skin: Skin is warm and dry.  Psychiatric: He has a normal mood and affect. His behavior is normal. Judgment and thought content normal.  Nursing note and vitals reviewed.   BP 114/64   Pulse 60   Temp 97.6 F (36.4 C) (Oral)   Ht 6\' 4"  (1.93 m)   Wt 199 lb (90.3 kg)   SpO2 97%   BMI 24.22 kg/m  Wt Readings from Last 3 Encounters:  02/11/19 199 lb (90.3 kg)  01/08/19 199 lb 9.6 oz (90.5 kg)  12/13/18 199 lb (90.3 kg)     Health Maintenance Due  Topic Date Due  . HIV Screening  12/17/2006  . TETANUS/TDAP  12/17/2010    There are no preventive care reminders to display for this patient.  Lab Results  Component Value Date   TSH 4.339 08/16/2011   Lab Results  Component Value Date   WBC 6.3 02/04/2019   HGB  9.6 (L) 02/04/2019   HCT 27.0 (L) 02/04/2019   MCV 108 (H) 02/04/2019   PLT 124 (L) 02/04/2019   Lab Results  Component Value Date   NA 140 01/08/2019   K 4.1 01/08/2019   CO2 24 01/08/2019   GLUCOSE 86 01/08/2019   BUN 6 01/08/2019   CREATININE 0.63 (L) 01/08/2019   BILITOT 4.4 (H) 01/08/2019   ALKPHOS 55 01/08/2019   AST 28 01/08/2019   ALT 12 01/08/2019   PROT 7.4 01/08/2019   ALBUMIN 4.3 01/08/2019   CALCIUM 9.3 01/08/2019   ANIONGAP 7 07/27/2018   Lab Results  Component Value Date   CHOL 87 08/16/2011   Lab Results  Component Value Date   HDL 31 (L) 08/16/2011   Lab Results  Component Value Date   LDLCALC 39 08/16/2011   Lab Results  Component Value Date   TRIG 85 08/16/2011   Lab Results  Component Value Date   CHOLHDL 2.8 08/16/2011   No results found for: HGBA1C    Assessment & Plan:   1. Sickle cell disease, type SS (HCC) - Urinalysis Dipstick  2. Hb-SS disease ([place]) He is doing well today. He will continue to take pain medications as prescribed; will continue to avoid extreme heat and cold; will continue to eat a healthy diet and drink at least 64 ounces of water daily; continue stool softener as needed; will avoid colds and flu; will continue to get plenty of sleep and rest; will continue to avoid high stressful situations and remain infection free; will continue Folic Acid 1 mg daily to avoid sickle cell crisis. Continue to follow up with Hematologist as needed.   3. Other chronic pain  4. Follow up He will follow up in 3 months.   No orders of the defined types were placed in this encounter.   Orders Placed This Encounter  Procedures  . Urinalysis Dipstick    Referral Orders  No referral(s) requested today    Kathe Becton,  MSN, FNP-BC [address], [place] [address] [phone] fax    Problem List Items Addressed This  Visit      Respiratory   Hb-SS disease with acute chest syndrome ([place])     Other   Sickle cell disease, type SS ([place]) - Primary   Relevant Orders   Urinalysis Dipstick (Completed)    Other Visit Diagnoses    Other chronic pain       Follow up          No orders of the defined types were placed in this encounter.   Follow-up: Return in about 3 months (around 05/12/2019).    Azzie Glatter, FNP

## 2019-02-12 ENCOUNTER — Encounter: Payer: Self-pay | Admitting: Family Medicine

## 2019-05-12 ENCOUNTER — Ambulatory Visit: Payer: PPO | Admitting: Family Medicine

## 2019-08-01 ENCOUNTER — Other Ambulatory Visit: Payer: Self-pay

## 2019-08-01 ENCOUNTER — Ambulatory Visit (INDEPENDENT_AMBULATORY_CARE_PROVIDER_SITE_OTHER): Payer: Self-pay | Admitting: Family Medicine

## 2019-08-01 ENCOUNTER — Encounter: Payer: Self-pay | Admitting: Family Medicine

## 2019-08-01 VITALS — BP 124/63 | HR 81 | Temp 98.1°F | Ht 76.0 in | Wt 202.4 lb

## 2019-08-01 DIAGNOSIS — D649 Anemia, unspecified: Secondary | ICD-10-CM

## 2019-08-01 DIAGNOSIS — E559 Vitamin D deficiency, unspecified: Secondary | ICD-10-CM

## 2019-08-01 DIAGNOSIS — Z09 Encounter for follow-up examination after completed treatment for conditions other than malignant neoplasm: Secondary | ICD-10-CM

## 2019-08-01 DIAGNOSIS — R0789 Other chest pain: Secondary | ICD-10-CM

## 2019-08-01 DIAGNOSIS — G8929 Other chronic pain: Secondary | ICD-10-CM

## 2019-08-01 DIAGNOSIS — D571 Sickle-cell disease without crisis: Secondary | ICD-10-CM

## 2019-08-01 LAB — POCT URINALYSIS DIPSTICK
Bilirubin, UA: NEGATIVE
Blood, UA: NEGATIVE
Glucose, UA: NEGATIVE
Ketones, UA: NEGATIVE
Leukocytes, UA: NEGATIVE
Nitrite, UA: NEGATIVE
Protein, UA: NEGATIVE
Spec Grav, UA: 1.015 (ref 1.010–1.025)
Urobilinogen, UA: 0.2 E.U./dL
pH, UA: 6 (ref 5.0–8.0)

## 2019-08-01 NOTE — Progress Notes (Signed)
[facility]  Established Patient Office Visit  Subjective:  Patient ID: Gregory Espinoza, male    DOB: 30-Sep-1990  Age: 29 y.o. MRN: [id number]  CC:  Chief Complaint  Patient presents with  . Follow-up    sickle cell, chest pain , achy pain x1 month ,neck pain x1 month ,lighthead     HPI Gregory Espinoza is a 39 male who presents for Follow Up today.   Patient Active Problem List   Diagnosis Date Noted  . Abdominal pain, right lower quadrant 12/19/2018  . Absolute anemia 12/13/2018  . Hb-SS disease with acute chest syndrome (HCC) 10/24/2018  . Transaminitis 03/13/2018  . Sickle cell disease, type SS (HCC) 04/24/2014    Past Medical History:  Diagnosis Date  . Erythrocytopenia 12/2018  . Hemoglobin S-S disease (HCC)   . Sickle cell anemia (HCC)   . Thrombocytopenia (HCC) 12/2018  . Vitamin D deficiency 12/2018    Current Status: Since his last office visit, he is doing well with no complaints. He states that he has pain in his arms and legs. He rates his pain today at 5/10. He has not had a hospital visit for Sickle Cell Crisis since 06/24/2019 where he was treated and discharged the same day. He is currently taking all medications as prescribed and staying well hydrated. He reports occasional nausea, constipation, dizziness and headaches. He denies fevers, chills, fatigue, recent infections, weight loss, and night sweats. He has not had any headaches, visual changes, dizziness, and falls. No chest pain, heart palpitations, cough and shortness of breath reported. Denies GI problems such as nausea, vomiting, diarrhea, and constipation. He has no reports of blood in stools, dysuria and hematuria. No depression or anxiety. He is taking all medications as prescribed.   Past Surgical History:  Procedure Laterality Date  . DENTAL SURGERY      Family History  Problem Relation Age of Onset  . Hypertension Father   . Sickle cell  anemia Brother     Social History   Socioeconomic History  . Marital status: Single    Spouse name: Not on file  . Number of children: Not on file  . Years of education: Not on file  . Highest education level: Not on file  Occupational History  . Not on file  Tobacco Use  . Smoking status: Never Smoker  . Smokeless tobacco: Never Used  Vaping Use  . Vaping Use: Never used  Substance and Sexual Activity  . Alcohol use: No    Alcohol/week: 0.0 standard drinks  . Drug use: No  . Sexual activity: Yes    Birth control/protection: Condom  Other Topics Concern  . Not on file  Social History Narrative  . Not on file   Social Determinants of Health   Financial Resource Strain:   . Difficulty of Paying Living Expenses:   Food Insecurity:   . Worried About Programme researcher, broadcasting/film/video in the Last Year:   . Barista in the Last Year:   Transportation Needs:   . Freight forwarder (Medical):   Marland Kitchen Lack of Transportation (Non-Medical):   Physical Activity:   . Days of Exercise per Week:   . Minutes of Exercise per Session:   Stress:   . Feeling of Stress :   Social Connections:   . Frequency of Communication with Friends and Family:   . Frequency of Social Gatherings with Friends and Family:   . Attends  Religious Services:   . Active Member of Clubs or Organizations:   . Attends Banker Meetings:   Marland Kitchen Marital Status:   Intimate Partner Violence:   . Fear of Current or Ex-Partner:   . Emotionally Abused:   Marland Kitchen Physically Abused:   . Sexually Abused:     Outpatient Medications Prior to Visit  Medication Sig Dispense Refill  . folic acid (FOLVITE) 800 MCG tablet Take by mouth.    . hydroxyurea (HYDREA) 500 MG capsule Take 500 mg by mouth daily. May take with food to minimize GI side effects.    . Multiple Vitamin (ONE-A-DAY 55 PLUS PO) Take by mouth. 1 tablet once a day    . naproxen (NAPROSYN) 500 MG tablet Take 1 tablet (500 mg total) by mouth 2 (two) times  daily. (Patient not taking: Reported on 08/01/2019) 30 tablet 0  . vitamin C (ASCORBIC ACID) 500 MG tablet Take 500 mg by mouth daily. (Patient not taking: Reported on 08/01/2019)    . Vitamin D, Ergocalciferol, (DRISDOL) 1.25 MG (50000 UT) CAPS capsule Take 1 capsule (50,000 Units total) by mouth every 7 (seven) days. (Patient not taking: Reported on 08/01/2019) 5 capsule 3   No facility-administered medications prior to visit.    No Known Allergies  ROS Review of Systems  Constitutional: Negative.   HENT: Negative.   Eyes: Negative.   Respiratory: Positive for shortness of breath (occasional ).   Gastrointestinal: Positive for constipation (occasional ) and nausea (occasional ).  Endocrine: Negative.   Genitourinary: Negative.   Musculoskeletal: Positive for arthralgias (generalized joint pain).  Skin: Negative.   Allergic/Immunologic: Negative.   Neurological: Positive for dizziness (occasional) and headaches (occasional).  Hematological: Negative.   Psychiatric/Behavioral: Negative.       Objective:    Physical Exam Vitals and nursing note reviewed.  Constitutional:      Appearance: Normal appearance.  HENT:     Head: Normocephalic and atraumatic.     Nose: Nose normal.     Mouth/Throat:     Mouth: Mucous membranes are moist.     Pharynx: Oropharynx is clear.  Cardiovascular:     Rate and Rhythm: Normal rate and regular rhythm.     Pulses: Normal pulses.     Heart sounds: Normal heart sounds.  Pulmonary:     Effort: Pulmonary effort is normal.     Breath sounds: Normal breath sounds.  Abdominal:     General: Bowel sounds are normal.     Palpations: Abdomen is soft.  Musculoskeletal:        General: Normal range of motion.     Cervical back: Normal range of motion and neck supple.  Skin:    General: Skin is warm.  Neurological:     General: No focal deficit present.     Mental Status: He is alert and oriented to person, place, and time.  Psychiatric:        Mood  and Affect: Mood normal.        Behavior: Behavior normal.        Thought Content: Thought content normal.        Judgment: Judgment normal.     BP 124/63 (BP Location: Right Arm, Patient Position: Sitting)   Pulse 81   Temp 98.1 F (36.7 C)   Ht 6\' 4"  (1.93 m)   Wt 202 lb 6.4 oz (91.8 kg)   SpO2 94%   BMI 24.64 kg/m  Wt Readings from Last 3 Encounters:  08/01/19 202 lb 6.4 oz (91.8 kg)  02/11/19 199 lb (90.3 kg)  01/08/19 199 lb 9.6 oz (90.5 kg)     Health Maintenance Due  Topic Date Due  . Hepatitis C Screening  Never done  . COVID-19 Vaccine (1) Never done  . HIV Screening  Never done  . TETANUS/TDAP  Never done    There are no preventive care reminders to display for this patient.  Lab Results  Component Value Date   TSH 4.339 08/16/2011   Lab Results  Component Value Date   WBC 6.9 08/01/2019   HGB 10.0 (L) 08/01/2019   HCT 29.9 (L) 08/01/2019   MCV 111 (H) 08/01/2019   PLT 152 08/01/2019   Lab Results  Component Value Date   NA 138 08/01/2019   K 4.5 08/01/2019   CO2 22 08/01/2019   GLUCOSE 88 08/01/2019   BUN 11 08/01/2019   CREATININE 0.77 08/01/2019   BILITOT 4.6 (H) 08/01/2019   ALKPHOS 53 08/01/2019   AST 30 08/01/2019   ALT 17 08/01/2019   PROT 8.0 08/01/2019   ALBUMIN 4.6 08/01/2019   CALCIUM 8.8 08/01/2019   ANIONGAP 7 07/27/2018   Lab Results  Component Value Date   CHOL 87 08/16/2011   Lab Results  Component Value Date   HDL 31 (L) 08/16/2011   Lab Results  Component Value Date   LDLCALC 39 08/16/2011   Lab Results  Component Value Date   TRIG 85 08/16/2011   Lab Results  Component Value Date   CHOLHDL 2.8 08/16/2011   No results found for: HGBA1C    Assessment & Plan:   1. Sickle cell disease, type SS (HCC) He is doing well today r/t hus chronic pain management. He will continue to take pain medications as prescribed; will continue to avoid extreme heat and cold; will continue to eat a healthy diet and drink at  least 64 ounces of water daily; continue stool softener as needed; will avoid colds and flu; will continue to get plenty of sleep and rest; will continue to avoid high stressful situations and remain infection free; will continue Folic Acid 1 mg daily to avoid sickle cell crisis. Continue to follow up with Hematologist as needed.  - POCT Urinalysis Dipstick - Ambulatory referral to Hematology - Sickle Cell Panel  2. Other chronic pain  3. Other chest pain Occasional. Stable today.   - EKG 12-Lead  4. Vitamin D deficiency - Sickle Cell Panel  5. Anemia, unspecified type  6. Follow up He will follow up in 3 months.   No orders of the defined types were placed in this encounter.   Orders Placed This Encounter  Procedures  . Sickle Cell Panel  . Ambulatory referral to Hematology  . POCT Urinalysis Dipstick  . EKG 12-Lead     Referral Orders     Ambulatory referral to Hematology   Raliegh Ip,  MSN, FNP-BC [facility] [address] [place], [place] [address] [phone] fax   Problem List Items Addressed This Visit      Other   Absolute anemia   Sickle cell disease, type SS (HCC) - Primary   Relevant Orders   POCT Urinalysis Dipstick (Completed)   Ambulatory referral to Hematology   Sickle Cell Panel (Completed)    Other Visit Diagnoses    Other chronic pain       Other chest pain  Relevant Orders   EKG 12-Lead   Vitamin D deficiency       Relevant Orders   Sickle Cell Panel (Completed)   Follow up          No orders of the defined types were placed in this encounter.   Follow-up: Return in about 3 months (around 11/01/2019).    Kallie LocksNatalie M Tomislav Micale, FNP

## 2019-08-02 LAB — CMP14+CBC/D/PLT+FER+RETIC+V...
ALT: 17 IU/L (ref 0–44)
AST: 30 IU/L (ref 0–40)
Albumin/Globulin Ratio: 1.4 (ref 1.2–2.2)
Albumin: 4.6 g/dL (ref 4.1–5.2)
Alkaline Phosphatase: 53 IU/L (ref 48–121)
BUN/Creatinine Ratio: 14 (ref 9–20)
BUN: 11 mg/dL (ref 6–20)
Basophils Absolute: 0.1 10*3/uL (ref 0.0–0.2)
Basos: 1 %
Bilirubin Total: 4.6 mg/dL — ABNORMAL HIGH (ref 0.0–1.2)
CO2: 22 mmol/L (ref 20–29)
Calcium: 8.8 mg/dL (ref 8.7–10.2)
Chloride: 102 mmol/L (ref 96–106)
Creatinine, Ser: 0.77 mg/dL (ref 0.76–1.27)
EOS (ABSOLUTE): 0.2 10*3/uL (ref 0.0–0.4)
Eos: 3 %
Ferritin: 161 ng/mL (ref 30–400)
GFR calc Af Amer: 144 mL/min/{1.73_m2} (ref >59)
GFR calc non Af Amer: 124 mL/min/{1.73_m2} (ref >59)
Globulin, Total: 3.4 g/dL (ref 1.5–4.5)
Glucose: 88 mg/dL (ref 65–99)
Hematocrit: 29.9 % — ABNORMAL LOW (ref 37.5–51.0)
Hemoglobin: 10 g/dL — ABNORMAL LOW (ref 13.0–17.7)
Immature Grans (Abs): 0 10*3/uL (ref 0.0–0.1)
Immature Granulocytes: 0 %
Lymphocytes Absolute: 1.9 10*3/uL (ref 0.7–3.1)
Lymphs: 28 %
MCH: 37.2 pg — ABNORMAL HIGH (ref 26.6–33.0)
MCHC: 33.4 g/dL (ref 31.5–35.7)
MCV: 111 fL — ABNORMAL HIGH (ref 79–97)
Monocytes Absolute: 0.9 10*3/uL (ref 0.1–0.9)
Monocytes: 13 %
NRBC: 3 % — ABNORMAL HIGH (ref 0–0)
Neutrophils Absolute: 3.8 10*3/uL (ref 1.4–7.0)
Neutrophils: 55 %
Platelets: 152 10*3/uL (ref 150–450)
Potassium: 4.5 mmol/L (ref 3.5–5.2)
RBC: 2.69 x10E6/uL — CL (ref 4.14–5.80)
RDW: 17.5 % — ABNORMAL HIGH (ref 11.6–15.4)
Retic Ct Pct: 6.5 % — ABNORMAL HIGH (ref 0.6–2.6)
Sodium: 138 mmol/L (ref 134–144)
Total Protein: 8 g/dL (ref 6.0–8.5)
Vit D, 25-Hydroxy: 15.9 ng/mL — ABNORMAL LOW (ref 30.0–100.0)
WBC: 6.9 10*3/uL (ref 3.4–10.8)

## 2019-08-07 ENCOUNTER — Encounter: Payer: Self-pay | Admitting: Family Medicine

## 2019-08-07 ENCOUNTER — Other Ambulatory Visit: Payer: Self-pay | Admitting: Family Medicine

## 2019-08-07 DIAGNOSIS — E569 Vitamin deficiency, unspecified: Secondary | ICD-10-CM

## 2019-08-07 MED ORDER — VITAMIN D (ERGOCALCIFEROL) 1.25 MG (50000 UNIT) PO CAPS: 50000.0000 [IU] | ORAL_CAPSULE | ORAL | 2 refills | Status: DC

## 2019-08-08 NOTE — Progress Notes (Signed)
I called pt and gave him his lab results and instructions per NP Natalie.

## 2019-10-31 ENCOUNTER — Other Ambulatory Visit: Payer: Self-pay

## 2019-10-31 ENCOUNTER — Ambulatory Visit (INDEPENDENT_AMBULATORY_CARE_PROVIDER_SITE_OTHER): Payer: BC Managed Care – PPO | Admitting: Family Medicine

## 2019-10-31 ENCOUNTER — Other Ambulatory Visit: Payer: Self-pay | Admitting: Registered Nurse

## 2019-10-31 ENCOUNTER — Encounter: Payer: Self-pay | Admitting: Family Medicine

## 2019-10-31 VITALS — BP 121/58 | HR 68 | Temp 98.1°F | Resp 16 | Ht 77.0 in | Wt 203.0 lb

## 2019-10-31 DIAGNOSIS — Z789 Other specified health status: Secondary | ICD-10-CM

## 2019-10-31 DIAGNOSIS — Z202 Contact with and (suspected) exposure to infections with a predominantly sexual mode of transmission: Secondary | ICD-10-CM

## 2019-10-31 DIAGNOSIS — D649 Anemia, unspecified: Secondary | ICD-10-CM | POA: Diagnosis not present

## 2019-10-31 DIAGNOSIS — D571 Sickle-cell disease without crisis: Secondary | ICD-10-CM | POA: Diagnosis not present

## 2019-10-31 DIAGNOSIS — G8929 Other chronic pain: Secondary | ICD-10-CM

## 2019-10-31 DIAGNOSIS — Z09 Encounter for follow-up examination after completed treatment for conditions other than malignant neoplasm: Secondary | ICD-10-CM

## 2019-10-31 DIAGNOSIS — Z8619 Personal history of other infectious and parasitic diseases: Secondary | ICD-10-CM

## 2019-10-31 HISTORY — DX: Personal history of other infectious and parasitic diseases: Z86.19

## 2019-10-31 NOTE — Progress Notes (Signed)
[facility] ' Established Patient Office Visit  Subjective:  Patient ID: Gregory Espinoza, male    DOB: [birth year]  Age: 29 y.o. MRN: [id number]  CC:  Chief Complaint  Patient presents with  . Follow-up    Pt states he has no question or concerns to discuss today. Pt states he would like a STI testing. Pt also states he doesn't have any symptoms but he ended things with a old partner and he wants to get check.    HPI Gregory LiverpoolKelechi Tingler is a 29 year old male who presents for Follow Up today.   Patient Active Problem List   Diagnosis Date Noted  . Abdominal pain, right lower quadrant 12/19/2018  . Absolute anemia 12/13/2018  . Hb-SS disease with acute chest syndrome (HCC) 10/24/2018  . Transaminitis 03/13/2018  . Sickle cell disease, type SS (HCC) 04/24/2014    Current Status: Since his last office visit, he is doing well with no complaints. He states that when he has Sickle Cell pain he usually has pain in his flank area . He rates his pain today at 0/10. He has not had a hospital visit for Sickle Cell Crisis since  where 06/24/2019 treated and discharged the same day. He is currently taking all medications as prescribed and staying well hydrated. He reports occasional nausea, constipation, dizziness and headaches. He has schedule appointment with Dr. Lucretia RoersWood, H[facility]10/2019. He denies fevers, chills, fatigue, recent infections, weight loss, and night sweats. He has not had any visual changes, and falls. No chest pain, heart palpitations, cough and shortness of breath reported. Denies GI problems such as vomiting, and diarrhea. He has no reports of blood in stools, dysuria and hematuria. No depression or anxiety reported today He is taking all medications as prescribed. He denies pain today.   Past Medical History:  Diagnosis Date  . Erythrocytopenia 12/2018  . Hemoglobin S-S disease (HCC)   .  Sickle cell anemia (HCC)   . Thrombocytopenia (HCC) 12/2018  . Vitamin D deficiency 12/2018    Past Surgical History:  Procedure Laterality Date  . DENTAL SURGERY      Family History  Problem Relation Age of Onset  . Hypertension Father   . Sickle cell anemia Brother     Social History   Socioeconomic History  . Marital status: Single    Spouse name: Not on file  . Number of children: Not on file  . Years of education: Not on file  . Highest education level: Not on file  Occupational History  . Not on file  Tobacco Use  . Smoking status: Never Smoker  . Smokeless tobacco: Never Used  Vaping Use  . Vaping Use: Never used  Substance and Sexual Activity  . Alcohol use: No    Alcohol/week: 0.0 standard drinks  . Drug use: No  . Sexual activity: Yes    Birth control/protection: Condom  Other Topics Concern  . Not on file  Social History Narrative  . Not on file   Social Determinants of Health   Financial Resource Strain:   . Difficulty of Paying Living Expenses: Not on file  Food Insecurity:   . Worried About Programme researcher, broadcasting/film/videounning Out of Food in the Last Year: Not on file  . Ran Out of Food in the Last Year: Not on file  Transportation Needs:   . Lack of Transportation (Medical): Not on file  . Lack of Transportation (  Non-Medical): Not on file  Physical Activity:   . Days of Exercise per Week: Not on file  . Minutes of Exercise per Session: Not on file  Stress:   . Feeling of Stress : Not on file  Social Connections:   . Frequency of Communication with Friends and Family: Not on file  . Frequency of Social Gatherings with Friends and Family: Not on file  . Attends Religious Services: Not on file  . Active Member of Clubs or Organizations: Not on file  . Attends Banker Meetings: Not on file  . Marital Status: Not on file  Intimate Partner Violence:   . Fear of Current or Ex-Partner: Not on file  . Emotionally Abused: Not on file  . Physically Abused: Not  on file  . Sexually Abused: Not on file    Outpatient Medications Prior to Visit  Medication Sig Dispense Refill  . folic acid (FOLVITE) 800 MCG tablet Take by mouth.    . hydroxyurea (HYDREA) 500 MG capsule Take 500 mg by mouth daily. May take with food to minimize GI side effects.    . Multiple Vitamin (ONE-A-DAY 55 PLUS PO) Take by mouth. 1 tablet once a day    . vitamin C (ASCORBIC ACID) 500 MG tablet Take 500 mg by mouth daily.     . Vitamin D, Ergocalciferol, (DRISDOL) 1.25 MG (50000 UNIT) CAPS capsule Take 1 capsule (50,000 Units total) by mouth every 7 (seven) days. 5 capsule 2  . naproxen (NAPROSYN) 500 MG tablet Take 1 tablet (500 mg total) by mouth 2 (two) times daily. (Patient not taking: Reported on 08/01/2019) 30 tablet 0   No facility-administered medications prior to visit.    No Known Allergies  ROS Review of Systems  Constitutional: Negative.   HENT: Negative.   Eyes: Negative.   Respiratory: Negative.   Cardiovascular: Negative.   Gastrointestinal: Negative.   Endocrine: Negative.   Genitourinary: Negative.   Musculoskeletal: Positive for arthralgias (generalized joint pain).  Skin: Negative.   Allergic/Immunologic: Negative.   Neurological: Positive for dizziness (occasional ) and headaches (occasional ).  Hematological: Negative.   Psychiatric/Behavioral: Negative.       Objective:    Physical Exam Vitals and nursing note reviewed.  Constitutional:      Appearance: Normal appearance.  HENT:     Head: Normocephalic and atraumatic.     Nose: Nose normal.     Mouth/Throat:     Mouth: Mucous membranes are moist.     Pharynx: Oropharynx is clear.  Cardiovascular:     Rate and Rhythm: Normal rate and regular rhythm.     Pulses: Normal pulses.     Heart sounds: Normal heart sounds.  Pulmonary:     Effort: Pulmonary effort is normal.     Breath sounds: Normal breath sounds.  Abdominal:     General: Bowel sounds are normal.     Palpations: Abdomen  is soft.  Musculoskeletal:        General: Normal range of motion.     Cervical back: Normal range of motion and neck supple.  Skin:    General: Skin is warm and dry.  Neurological:     General: No focal deficit present.     Mental Status: He is alert and oriented to person, place, and time.  Psychiatric:        Mood and Affect: Mood normal.        Behavior: Behavior normal.  Thought Content: Thought content normal.        Judgment: Judgment normal.     BP (!) 121/58 (BP Location: Left Arm, Patient Position: Sitting, Cuff Size: Normal)   Pulse 68   Temp 98.1 F (36.7 C)   Resp 16   Ht 6\' 5"  (1.956 m)   Wt 203 lb (92.1 kg)   SpO2 98%   BMI 24.07 kg/m  Wt Readings from Last 3 Encounters:  10/31/19 203 lb (92.1 kg)  08/01/19 202 lb 6.4 oz (91.8 kg)  02/11/19 199 lb (90.3 kg)     Health Maintenance Due  Topic Date Due  . Hepatitis C Screening  Never done  . COVID-19 Vaccine (1) Never done  . HIV Screening  Never done  . TETANUS/TDAP  Never done  . INFLUENZA VACCINE  08/31/2019    There are no preventive care reminders to display for this patient.  Lab Results  Component Value Date   TSH 4.339 08/16/2011   Lab Results  Component Value Date   WBC 6.9 08/01/2019   HGB 10.0 (L) 08/01/2019   HCT 29.9 (L) 08/01/2019   MCV 111 (H) 08/01/2019   PLT 152 08/01/2019   Lab Results  Component Value Date   NA 138 08/01/2019   K 4.5 08/01/2019   CO2 22 08/01/2019   GLUCOSE 88 08/01/2019   BUN 11 08/01/2019   CREATININE 0.77 08/01/2019   BILITOT 4.6 (H) 08/01/2019   ALKPHOS 53 08/01/2019   AST 30 08/01/2019   ALT 17 08/01/2019   PROT 8.0 08/01/2019   ALBUMIN 4.6 08/01/2019   CALCIUM 8.8 08/01/2019   ANIONGAP 7 07/27/2018   Lab Results  Component Value Date   CHOL 87 08/16/2011   Lab Results  Component Value Date   HDL 31 (L) 08/16/2011   Lab Results  Component Value Date   LDLCALC 39 08/16/2011   Lab Results  Component Value Date   TRIG 85  08/16/2011   Lab Results  Component Value Date   CHOLHDL 2.8 08/16/2011   No results found for: HGBA1C    Assessment & Plan:   1. Hemoglobin SS disease without crisis [facility]) He is doing well today r/t his chronic pain management. He will continue to take pain medications as prescribed; will continue to avoid extreme heat and cold; will continue to eat a healthy diet and drink at least 64 ounces of water daily; continue stool softener as needed; will avoid colds and flu; will continue to get plenty of sleep and rest; will continue to avoid high stressful situations and remain infection free; will continue Folic Acid 1 mg daily to avoid sickle cell crisis. Continue to follow up with Hematologist as needed.   2. Other chronic pain  3. Opioid naive  4. Anemia, unspecified type  5. Possible exposure to STD - STD Screen (8) - RPR - Chlamydia/Gonococcus/Trichomonas, NAA - Urinalysis Dipstick  6. Follow up He will follow up in 3 months.   No orders of the defined types were placed in this encounter.   Orders Placed This Encounter  Procedures  . Chlamydia/Gonococcus/Trichomonas, NAA  . STD Screen (8)  . RPR  . Urinalysis Dipstick    Referral Orders  No referral(s) requested today    [facility],  MSN, FNP-BC [facility] Medicine/Sick[facility] Cell Center [facility] [address] [place], [place] [phone] fax  Problem List Items Addressed This Visit      Other  Absolute anemia   Sickle cell disease, type SS (HCC) - Primary    Other Visit Diagnoses    Other chronic pain       Opioid naive       Possible exposure to STD       Relevant Orders   STD Screen (8)   RPR   Chlamydia/Gonococcus/Trichomonas, NAA   Urinalysis Dipstick   Follow up          No orders of the defined types were placed in this encounter.   Follow-up: Return in about 3 months (around 01/31/2020).    Kallie Locks,  FNP

## 2019-11-01 LAB — STD SCREEN (8)
HIV Screen 4th Generation wRfx: NONREACTIVE
HSV 1 Glycoprotein G Ab, IgG: 41.2 index — ABNORMAL HIGH (ref 0.00–0.90)
HSV 2 IgG, Type Spec: <0.91 index (ref 0.00–0.90)
Hep A IgM: NEGATIVE
Hep B C IgM: NEGATIVE
Hep C Virus Ab: <0.1 s/co ratio (ref 0.0–0.9)
Hepatitis B Surface Ag: NEGATIVE
RPR Ser Ql: NONREACTIVE

## 2019-11-05 LAB — CHLAMYDIA/GONOCOCCUS/TRICHOMONAS, NAA
Chlamydia by NAA: NEGATIVE
Gonococcus by NAA: NEGATIVE
Trich vag by NAA: NEGATIVE

## 2019-11-07 ENCOUNTER — Encounter: Payer: Self-pay | Admitting: Family Medicine

## 2019-11-10 DIAGNOSIS — D571 Sickle-cell disease without crisis: Secondary | ICD-10-CM | POA: Diagnosis not present

## 2019-11-10 DIAGNOSIS — E559 Vitamin D deficiency, unspecified: Secondary | ICD-10-CM | POA: Diagnosis not present

## 2019-11-10 DIAGNOSIS — Z8661 Personal history of infections of the central nervous system: Secondary | ICD-10-CM | POA: Diagnosis not present

## 2019-11-10 DIAGNOSIS — Z23 Encounter for immunization: Secondary | ICD-10-CM | POA: Diagnosis not present

## 2020-01-16 ENCOUNTER — Other Ambulatory Visit: Payer: BC Managed Care – PPO

## 2020-02-02 ENCOUNTER — Ambulatory Visit: Payer: BC Managed Care – PPO | Admitting: Family Medicine

## 2020-02-03 ENCOUNTER — Encounter: Payer: Self-pay | Admitting: Family Medicine

## 2020-02-03 ENCOUNTER — Other Ambulatory Visit: Payer: Self-pay

## 2020-02-03 ENCOUNTER — Ambulatory Visit (INDEPENDENT_AMBULATORY_CARE_PROVIDER_SITE_OTHER): Payer: BC Managed Care – PPO | Admitting: Family Medicine

## 2020-02-03 VITALS — BP 140/63 | HR 66 | Temp 97.3°F | Resp 20 | Ht 78.0 in | Wt 203.2 lb

## 2020-02-03 DIAGNOSIS — R5383 Other fatigue: Secondary | ICD-10-CM

## 2020-02-03 DIAGNOSIS — R0789 Other chest pain: Secondary | ICD-10-CM

## 2020-02-03 DIAGNOSIS — D649 Anemia, unspecified: Secondary | ICD-10-CM

## 2020-02-03 DIAGNOSIS — D571 Sickle-cell disease without crisis: Secondary | ICD-10-CM | POA: Diagnosis not present

## 2020-02-03 DIAGNOSIS — Z789 Other specified health status: Secondary | ICD-10-CM

## 2020-02-03 DIAGNOSIS — E559 Vitamin D deficiency, unspecified: Secondary | ICD-10-CM | POA: Diagnosis not present

## 2020-02-03 DIAGNOSIS — Z09 Encounter for follow-up examination after completed treatment for conditions other than malignant neoplasm: Secondary | ICD-10-CM

## 2020-02-03 MED ORDER — ASPIRIN EC 81 MG PO TBEC: 81.0000 mg | DELAYED_RELEASE_TABLET | Freq: Every day | ORAL | 11 refills | Status: DC

## 2020-02-03 NOTE — Progress Notes (Signed)
[facility]    Established Patient Office Visit  Subjective:  Patient ID: Gregory Espinoza, male    DOB: 19-Mar-1990  Age: 30 y.o. MRN: [id number]  CC: No chief complaint on file.   HPI Gregory Espinoza is a 30 year old male who presents for Follow Up today.    Patient Active Problem List   Diagnosis Date Noted  . Abdominal pain, right lower quadrant 12/19/2018  . Absolute anemia 12/13/2018  . Hb-SS disease with acute chest syndrome (HCC) 10/24/2018  . Transaminitis 03/13/2018  . Sickle cell disease, type SS (HCC) 04/24/2014   Current Status: Since his last office visit, he is doing well with no complaints. He states that he has pain in his arms and legs. He rates his pain today at 0/10. He has not had a hospital visit for Sickle Cell Crisis since 06/24/2019 where he was treated and discharged the same day. He is currently taking all medications as prescribed and staying well hydrated. He continues Hydrea as prescribed and is opioid naive. He reports occasional nausea, constipation, dizziness and headaches. He reports chest pain which began 2 weeks ago while at the gym. He states that he did stop exercising he has continued to feel mild intermittent chest pain and shortness of breath. Pain/discomfort usually last for 1-2 minutes. He denies heart palpitations or cough. He denies fevers, chills, recent infections, weight loss, and night sweats. He has not had any visual changes, and falls.  Denies GI problems such as vomiting, diarrhea. He has no reports of blood in stools, dysuria and hematuria. No depression or anxiety reported today. He is taking all medications as prescribed.   Past Medical History:  Diagnosis Date  . Erythrocytopenia 12/2018  . Hemoglobin S-S disease (HCC)   . 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 Sickle cell anemia (HCC)   . Thrombocytopenia (HCC) 12/2018  . Vitamin D deficiency 12/2018    Past Surgical  History:  Procedure Laterality Date  . DENTAL SURGERY      Family History  Problem Relation Age of Onset  . Hypertension Father   . Sickle cell anemia Brother     Social History   Socioeconomic History  . Marital status: Single    Spouse name: Not on file  . Number of children: Not on file  . Years of education: Not on file  . Highest education level: Not on file  Occupational History  . Not on file  Tobacco Use  . Smoking status: Never Smoker  . Smokeless tobacco: Never Used  Vaping Use  . Vaping Use: Never used  Substance and Sexual Activity  . Alcohol use: No    Alcohol/week: 0.0 standard drinks  . Drug use: No  . Sexual activity: Yes    Birth control/protection: Condom  Other Topics Concern  . Not on file  Social History Narrative  . Not on file   Social Determinants of Health   Financial Resource Strain: Not on file  Food Insecurity: Not on file  Transportation Needs: Not on file  Physical Activity: Not on file  Stress: Not on file  Social Connections: Not on file  Intimate Partner Violence: Not on file    Outpatient Medications Prior to Visit  Medication Sig Dispense Refill  . folic acid (FOLVITE) 800 MCG tablet Take by mouth.    . hydroxyurea (HYDREA) 500 MG capsule Take 500 mg by mouth daily. May take with food to minimize  GI side effects.    . Multiple Vitamin (ONE-A-DAY 55 PLUS PO) Take by mouth. 1 tablet once a day    . naproxen (NAPROSYN) 500 MG tablet Take 1 tablet (500 mg total) by mouth 2 (two) times daily. (Patient not taking: Reported on 08/01/2019) 30 tablet 0  . vitamin C (ASCORBIC ACID) 500 MG tablet Take 500 mg by mouth daily.     . Vitamin D, Ergocalciferol, (DRISDOL) 1.25 MG (50000 UNIT) CAPS capsule Take 1 capsule (50,000 Units total) by mouth every 7 (seven) days. 5 capsule 2   No facility-administered medications prior to visit.    No Known Allergies  ROS Review of Systems  Constitutional: Negative.   HENT: Negative.   Eyes:  Negative.   Respiratory: Negative.   Cardiovascular:       Chest discomfort  Gastrointestinal: Positive for constipation (occasional ) and nausea (occasional ).  Endocrine: Negative.   Genitourinary: Negative.   Musculoskeletal: Positive for arthralgias (generalized joint pain).  Skin: Negative.   Allergic/Immunologic: Negative.   Neurological: Positive for dizziness (occasional ) and headaches (occasional ).  Hematological: Negative.   Psychiatric/Behavioral: Negative.       Objective:    Physical Exam Vitals and nursing note reviewed.  Constitutional:      Appearance: Normal appearance.  HENT:     Head: Normocephalic and atraumatic.     Nose: Nose normal.     Mouth/Throat:     Mouth: Mucous membranes are moist.     Pharynx: Oropharynx is clear.  Cardiovascular:     Rate and Rhythm: Normal rate and regular rhythm.     Pulses: Normal pulses.     Heart sounds: Normal heart sounds.  Pulmonary:     Effort: Pulmonary effort is normal.     Breath sounds: Normal breath sounds.  Abdominal:     General: Bowel sounds are normal.     Palpations: Abdomen is soft.  Musculoskeletal:        General: Normal range of motion.     Cervical back: Normal range of motion and neck supple.  Skin:    General: Skin is warm and dry.  Neurological:     General: No focal deficit present.     Mental Status: He is alert and oriented to person, place, and time.  Psychiatric:        Mood and Affect: Mood normal.        Behavior: Behavior normal.        Thought Content: Thought content normal.        Judgment: Judgment normal.     BP 140/63   Pulse 66   Temp (!) 97.3 F (36.3 C)   Resp 20   Ht 6\' 6"  (1.981 m)   Wt 203 lb 3.2 oz (92.2 kg)   SpO2 97%   BMI 23.48 kg/m  Wt Readings from Last 3 Encounters:  02/03/20 203 lb 3.2 oz (92.2 kg)  10/31/19 203 lb (92.1 kg)  08/01/19 202 lb 6.4 oz (91.8 kg)     Health Maintenance Due  Topic Date Due  . COVID-19 Vaccine (1) Never done  .  TETANUS/TDAP  Never done    There are no preventive care reminders to display for this patient.  Lab Results  Component Value Date   TSH 4.339 08/16/2011   Lab Results  Component Value Date   WBC 6.9 08/01/2019   HGB 10.0 (L) 08/01/2019   HCT 29.9 (L) 08/01/2019   MCV 111 (H) 08/01/2019  PLT 152 08/01/2019   Lab Results  Component Value Date   NA 138 08/01/2019   K 4.5 08/01/2019   CO2 22 08/01/2019   GLUCOSE 88 08/01/2019   BUN 11 08/01/2019   CREATININE 0.77 08/01/2019   BILITOT 4.6 (H) 08/01/2019   ALKPHOS 53 08/01/2019   AST 30 08/01/2019   ALT 17 08/01/2019   PROT 8.0 08/01/2019   ALBUMIN 4.6 08/01/2019   CALCIUM 8.8 08/01/2019   ANIONGAP 7 07/27/2018   Lab Results  Component Value Date   CHOL 87 08/16/2011   Lab Results  Component Value Date   HDL 31 (L) 08/16/2011   Lab Results  Component Value Date   LDLCALC 39 08/16/2011   Lab Results  Component Value Date   TRIG 85 08/16/2011   Lab Results  Component Value Date   CHOLHDL 2.8 08/16/2011   No results found for: HGBA1C    Assessment & Plan:   1. Hemoglobin SS disease without crisis [facility]) He is doing well today r/t his chronic pain management. He iwll decrease reps when exercising to minimize risks of crisis. He will continue to take pain medications as prescribed; will continue to avoid extreme heat and cold; will continue to eat a healthy diet and drink at least 64 ounces of water daily; continue stool softener as needed; will avoid colds and flu; will continue to get plenty of sleep and rest; will continue to avoid high stressful situations and remain infection free; will continue Folic Acid 1 mg daily to avoid sickle cell crisis. Continue to follow up with Hematologist as needed.  - Sickle Cell Panel - Vitamin B12  2. Other chest pain EKG is stable. We will initiate daily Aspirin 81 mg.  - EKG 12-Lead - aspirin EC 81 MG tablet; Take 1 tablet (81 mg total) by mouth daily. Swallow whole.   Dispense: 30 tablet; Refill: 11  3. Chest discomfort Stable today. No signs or symptoms of respiratory distress noted or reported today.   4. Fatigue, unspecified type Increased. We will assess further today.  - Vitamin B12  5. Opioid naive  6. Anemia, unspecified type  7. Vitamin D deficiency - Sickle Cell Panel  8. Follow up He will follow up in 1 month.  Meds ordered this encounter  Medications  . aspirin EC 81 MG tablet    Sig: Take 1 tablet (81 mg total) by mouth daily. Swallow whole.    Dispense:  30 tablet    Refill:  11    Orders Placed This Encounter  Procedures  . Sickle Cell Panel  . Vitamin B12  . EKG 12-Lead   Referral Orders  No referral(s) requested today    Raliegh Ip, MSN, ANE, FNP-BC [facility] Patient Care Center/Internal Medicine/Sickle Cell Center [facility] [address] [place], [place] [address] [phone] fax   Problem List Items Addressed This Visit      Other   Absolute anemia   Sickle cell disease, type SS (HCC) - Primary   Relevant Orders   Sickle Cell Panel   Vitamin B12    Other Visit Diagnoses    Other chest pain       Relevant Medications   aspirin EC 81 MG tablet   Other Relevant Orders   EKG 12-Lead   Chest discomfort       Fatigue, unspecified type       Relevant Orders   Vitamin B12   Opioid naive  Vitamin D deficiency       Relevant Orders   Sickle Cell Panel   Follow up          Meds ordered this encounter  Medications  . aspirin EC 81 MG tablet    Sig: Take 1 tablet (81 mg total) by mouth daily. Swallow whole.    Dispense:  30 tablet    Refill:  11    Follow-up: Return in about 1 month (around 03/05/2020).    Azzie Glatter, FNP

## 2020-02-04 LAB — CMP14+CBC/D/PLT+FER+RETIC+V...
ALT: 16 IU/L (ref 0–44)
AST: 27 IU/L (ref 0–40)
Albumin/Globulin Ratio: 1.4 (ref 1.2–2.2)
Albumin: 4.7 g/dL (ref 4.1–5.2)
Alkaline Phosphatase: 51 IU/L (ref 44–121)
BUN/Creatinine Ratio: 10 (ref 9–20)
BUN: 9 mg/dL (ref 6–20)
Basophils Absolute: 0.1 10*3/uL (ref 0.0–0.2)
Basos: 1 %
Bilirubin Total: 4.5 mg/dL — ABNORMAL HIGH (ref 0.0–1.2)
CO2: 22 mmol/L (ref 20–29)
Calcium: 9.6 mg/dL (ref 8.7–10.2)
Chloride: 101 mmol/L (ref 96–106)
Creatinine, Ser: 0.88 mg/dL (ref 0.76–1.27)
EOS (ABSOLUTE): 0.2 10*3/uL (ref 0.0–0.4)
Eos: 3 %
Ferritin: 103 ng/mL (ref 30–400)
GFR calc Af Amer: 135 mL/min/{1.73_m2} (ref >59)
GFR calc non Af Amer: 117 mL/min/{1.73_m2} (ref >59)
Globulin, Total: 3.3 g/dL (ref 1.5–4.5)
Glucose: 81 mg/dL (ref 65–99)
Hematocrit: 30.4 % — ABNORMAL LOW (ref 37.5–51.0)
Hemoglobin: 10.7 g/dL — ABNORMAL LOW (ref 13.0–17.7)
Immature Grans (Abs): 0 10*3/uL (ref 0.0–0.1)
Immature Granulocytes: 0 %
Lymphocytes Absolute: 1.8 10*3/uL (ref 0.7–3.1)
Lymphs: 25 %
MCH: 38.8 pg — ABNORMAL HIGH (ref 26.6–33.0)
MCHC: 35.2 g/dL (ref 31.5–35.7)
MCV: 110 fL — ABNORMAL HIGH (ref 79–97)
Monocytes Absolute: 0.8 10*3/uL (ref 0.1–0.9)
Monocytes: 11 %
NRBC: 2 % — ABNORMAL HIGH (ref 0–0)
Neutrophils Absolute: 4.5 10*3/uL (ref 1.4–7.0)
Neutrophils: 60 %
Platelets: 419 10*3/uL (ref 150–450)
Potassium: 4.7 mmol/L (ref 3.5–5.2)
RBC: 2.76 x10E6/uL — ABNORMAL LOW (ref 4.14–5.80)
RDW: 15.6 % — ABNORMAL HIGH (ref 11.6–15.4)
Retic Ct Pct: 5.8 % — ABNORMAL HIGH (ref 0.6–2.6)
Sodium: 141 mmol/L (ref 134–144)
Total Protein: 8 g/dL (ref 6.0–8.5)
Vit D, 25-Hydroxy: 19.5 ng/mL — ABNORMAL LOW (ref 30.0–100.0)
WBC: 7.5 10*3/uL (ref 3.4–10.8)

## 2020-02-04 LAB — VITAMIN B12: Vitamin B-12: 828 pg/mL (ref 232–1245)

## 2020-02-05 ENCOUNTER — Encounter: Payer: Self-pay | Admitting: Family Medicine

## 2020-02-06 ENCOUNTER — Other Ambulatory Visit: Payer: Self-pay | Admitting: Family Medicine

## 2020-02-06 DIAGNOSIS — E569 Vitamin deficiency, unspecified: Secondary | ICD-10-CM

## 2020-02-06 MED ORDER — VITAMIN D (ERGOCALCIFEROL) 1.25 MG (50000 UNIT) PO CAPS: 50000.0000 [IU] | ORAL_CAPSULE | ORAL | 2 refills | Status: DC

## 2020-02-11 DIAGNOSIS — Z79899 Other long term (current) drug therapy: Secondary | ICD-10-CM | POA: Diagnosis not present

## 2020-02-11 DIAGNOSIS — E559 Vitamin D deficiency, unspecified: Secondary | ICD-10-CM | POA: Diagnosis not present

## 2020-02-11 DIAGNOSIS — R079 Chest pain, unspecified: Secondary | ICD-10-CM | POA: Diagnosis not present

## 2020-02-11 DIAGNOSIS — D571 Sickle-cell disease without crisis: Secondary | ICD-10-CM | POA: Diagnosis not present

## 2020-02-26 DIAGNOSIS — R079 Chest pain, unspecified: Secondary | ICD-10-CM | POA: Diagnosis not present

## 2020-03-03 ENCOUNTER — Encounter: Payer: Self-pay | Admitting: Family Medicine

## 2020-03-03 ENCOUNTER — Ambulatory Visit (INDEPENDENT_AMBULATORY_CARE_PROVIDER_SITE_OTHER): Payer: BC Managed Care – PPO | Admitting: Family Medicine

## 2020-03-03 ENCOUNTER — Other Ambulatory Visit: Payer: Self-pay

## 2020-03-03 VITALS — BP 134/59 | HR 77 | Temp 98.8°F | Ht 78.0 in | Wt 202.0 lb

## 2020-03-03 DIAGNOSIS — Z202 Contact with and (suspected) exposure to infections with a predominantly sexual mode of transmission: Secondary | ICD-10-CM | POA: Diagnosis not present

## 2020-03-03 DIAGNOSIS — Z09 Encounter for follow-up examination after completed treatment for conditions other than malignant neoplasm: Secondary | ICD-10-CM

## 2020-03-03 DIAGNOSIS — E569 Vitamin deficiency, unspecified: Secondary | ICD-10-CM | POA: Diagnosis not present

## 2020-03-03 DIAGNOSIS — Z789 Other specified health status: Secondary | ICD-10-CM

## 2020-03-03 DIAGNOSIS — D571 Sickle-cell disease without crisis: Secondary | ICD-10-CM | POA: Diagnosis not present

## 2020-03-03 DIAGNOSIS — R0789 Other chest pain: Secondary | ICD-10-CM

## 2020-03-03 NOTE — Progress Notes (Signed)
[facility]  Established Patient Office Visit  Subjective:  Patient ID: Gregory Espinoza, male    DOB: [birth year]  Age: 30 y.o. MRN: [id number]  CC:  Chief Complaint  Patient presents with  . Follow-up    1 month follow up , pt has no concerns     HPI Gregory Espinoza is a 30 year old male who presents for Follow Up today.   Patient Active Problem List   Diagnosis Date Noted  . Abdominal pain, right lower quadrant 12/19/2018  . Absolute anemia 12/13/2018  . Hb-SS disease with acute chest syndrome (HCC) 10/24/2018  . Transaminitis 03/13/2018  . Sickle cell disease, type SS (HCC) 04/24/2014    Current Status: Since his last office visit, he is doing well with no complaints. He states that he has pain in his arms and legs. He rates his pain today at 0/10. He has not had a hospital visit for Sickle Cell Crisis since 06/24/2019 where he was treated and discharged the same day. [facility]. He is currently taking all medications as prescribed and staying well hydrated. He reports occasional nausea, constipation, dizziness and headaches. He continues to be Opioid Naive. He states that incidents of chest discomfort has decreased since beginning ASA 81 mg daily. He has also decreased his workout and reps while at the gym. He denies fevers, chills, recent infections, weight loss, and night sweats. He has not had any visual changes, and falls. No chest pain, heart palpitations, cough and shortness of breath reported. Denies GI problems such as vomiting, and diarrhea. He has no reports of blood in stools, dysuria and hematuria. No depression or anxiety reported today.  He is taking all medications as prescribed.   Past Medical History:  Diagnosis Date  . Erythrocytopenia 12/2018  . Hemoglobin S-S disease (HCC)   . 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 Sickle cell anemia (HCC)   .  Thrombocytopenia (HCC) 12/2018  . Vitamin D deficiency 12/2018    Past Surgical History:  Procedure Laterality Date  . DENTAL SURGERY      Family History  Problem Relation Age of Onset  . Hypertension Father   . Sickle cell anemia Brother     Social History   Socioeconomic History  . Marital status: Single    Spouse name: Not on file  . Number of children: Not on file  . Years of education: Not on file  . Highest education level: Not on file  Occupational History  . Not on file  Tobacco Use  . Smoking status: Never Smoker  . Smokeless tobacco: Never Used  Vaping Use  . Vaping Use: Never used  Substance and Sexual Activity  . Alcohol use: No    Alcohol/week: 0.0 standard drinks  . Drug use: No  . Sexual activity: Yes    Birth control/protection: Condom  Other Topics Concern  . Not on file  Social History Narrative  . Not on file   Social Determinants of Health   Financial Resource Strain: Not on file  Food Insecurity: Not on file  Transportation Needs: Not on file  Physical Activity: Not on file  Stress: Not on file  Social Connections: Not on file  Intimate Partner Violence: Not on file    Outpatient Medications Prior to Visit  Medication Sig Dispense Refill  . aspirin EC 81 MG tablet Take 1 tablet (81 mg total)  by mouth daily. Swallow whole. 30 tablet 11  . folic acid (FOLVITE) 800 MCG tablet Take by mouth.    . hydroxyurea (HYDREA) 500 MG capsule Take 500 mg by mouth daily. May take with food to minimize GI side effects.    . Multiple Vitamin (ONE-A-DAY 55 PLUS PO) Take by mouth. 1 tablet once a day    . naproxen (NAPROSYN) 500 MG tablet Take 1 tablet (500 mg total) by mouth 2 (two) times daily. 30 tablet 0  . vitamin C (ASCORBIC ACID) 500 MG tablet Take 500 mg by mouth daily.     . Vitamin D, Ergocalciferol, (DRISDOL) 1.25 MG (50000 UNIT) CAPS capsule Take 1 capsule (50,000 Units total) by mouth every 7 (seven) days. X 12 weeks. 5 capsule 2   No  facility-administered medications prior to visit.    No Known Allergies  ROS Review of Systems  Constitutional: Negative.   HENT: Negative.   Eyes: Negative.   Respiratory: Negative.   Cardiovascular: Negative.   Endocrine: Negative.   Genitourinary: Negative.   Musculoskeletal: Positive for arthralgias (generalized joint pain).  Skin: Negative.   Allergic/Immunologic: Negative.   Neurological: Positive for dizziness (occasional ) and headaches (occasional ).  Hematological: Negative.   Psychiatric/Behavioral: Negative.       Objective:    Physical Exam Vitals and nursing note reviewed.  Constitutional:      Appearance: Normal appearance.  HENT:     Head: Normocephalic and atraumatic.     Nose: Nose normal.     Mouth/Throat:     Mouth: Mucous membranes are moist.     Pharynx: Oropharynx is clear.  Cardiovascular:     Rate and Rhythm: Normal rate and regular rhythm.     Pulses: Normal pulses.     Heart sounds: Normal heart sounds.  Pulmonary:     Effort: Pulmonary effort is normal.     Breath sounds: Normal breath sounds.  Abdominal:     General: Bowel sounds are normal.     Palpations: Abdomen is soft.  Musculoskeletal:        General: Normal range of motion.     Cervical back: Normal range of motion and neck supple.  Skin:    General: Skin is warm and dry.  Neurological:     General: No focal deficit present.     Mental Status: He is alert and oriented to person, place, and time.  Psychiatric:        Mood and Affect: Mood normal.        Behavior: Behavior normal.        Thought Content: Thought content normal.        Judgment: Judgment normal.     BP (!) 134/59 (BP Location: Left Arm, Patient Position: Sitting, Cuff Size: Normal)   Pulse 77   Temp 98.8 F (37.1 C) (Temporal)   Ht 6\' 6"  (1.981 m)   Wt 202 lb (91.6 kg)   SpO2 95%   BMI 23.34 kg/m  Wt Readings from Last 3 Encounters:  03/03/20 202 lb (91.6 kg)  02/03/20 203 lb 3.2 oz (92.2 kg)   10/31/19 203 lb (92.1 kg)     Health Maintenance Due  Topic Date Due  . COVID-19 Vaccine (1) Never done  . TETANUS/TDAP  Never done    There are no preventive care reminders to display for this patient.  Lab Results  Component Value Date   TSH 4.339 08/16/2011   Lab Results  Component Value Date  WBC 7.5 02/03/2020   HGB 10.7 (L) 02/03/2020   HCT 30.4 (L) 02/03/2020   MCV 110 (H) 02/03/2020   PLT 419 02/03/2020   Lab Results  Component Value Date   NA 141 02/03/2020   K 4.7 02/03/2020   CO2 22 02/03/2020   GLUCOSE 81 02/03/2020   BUN 9 02/03/2020   CREATININE 0.88 02/03/2020   BILITOT 4.5 (H) 02/03/2020   ALKPHOS 51 02/03/2020   AST 27 02/03/2020   ALT 16 02/03/2020   PROT 8.0 02/03/2020   ALBUMIN 4.7 02/03/2020   CALCIUM 9.6 02/03/2020   ANIONGAP 7 07/27/2018   Lab Results  Component Value Date   CHOL 87 08/16/2011   Lab Results  Component Value Date   HDL 31 (L) 08/16/2011   Lab Results  Component Value Date   LDLCALC 39 08/16/2011   Lab Results  Component Value Date   TRIG 85 08/16/2011   Lab Results  Component Value Date   CHOLHDL 2.8 08/16/2011   No results found for: HGBA1C    Assessment & Plan:   1. Possible exposure to STD - Chlamydia/Gonococcus/Trichomonas, NAA - STD Screen (8)  2. Hemoglobin SS disease without crisis [facility]) He is doing well today r/t his chronic pain management. He will continue to take pain medications as prescribed; will continue to avoid extreme heat and cold; will continue to eat a healthy diet and drink at least 64 ounces of water daily; continue stool softener as needed; will avoid colds and flu; will continue to get plenty of sleep and rest; will continue to avoid high stressful situations and remain infection free; will continue Folic Acid 1 mg daily to avoid sickle cell crisis. Continue to follow up with Hematologist as needed.   3. Vitamin deficiency  4. Chest discomfort Stable. Continue ASA 81 mg  daily.  5. Opioid naive  6. Follow up He will follow up in 2 months.   No orders of the defined types were placed in this encounter.  Orders Placed This Encounter  Procedures  . Chlamydia/Gonococcus/Trichomonas, NAA  . STD Screen (8)    Referral Orders  No referral(s) requested today    Raliegh Ip, MSN, ANE, FNP-BC [facility] [address] [place], [place] [address] [phone]- fax  Problem List Items Addressed This Visit      Other   Sickle cell disease, type SS (HCC)    Other Visit Diagnoses    Possible exposure to STD    -  Primary   Relevant Orders   Chlamydia/Gonococcus/Trichomonas, NAA   STD Screen (8)   Vitamin deficiency       Chest discomfort       Opioid naive       Follow up          No orders of the defined types were placed in this encounter.   Follow-up: No follow-ups on file.    Kallie Locks, FNP

## 2020-03-04 ENCOUNTER — Encounter: Payer: Self-pay | Admitting: Family Medicine

## 2020-03-04 LAB — STD SCREEN (8)
HIV Screen 4th Generation wRfx: NONREACTIVE
HSV 1 Glycoprotein G Ab, IgG: 49.1 index — ABNORMAL HIGH (ref 0.00–0.90)
HSV 2 IgG, Type Spec: <0.91 index (ref 0.00–0.90)
Hep A IgM: NEGATIVE
Hep B C IgM: NEGATIVE
Hep C Virus Ab: 0.4 s/co ratio (ref 0.0–0.9)
Hepatitis B Surface Ag: NEGATIVE
RPR Ser Ql: REACTIVE — AB

## 2020-03-04 LAB — RPR, QUANT. (REFLEX): Rapid Plasma Reagin, Quant: 1:1 {titer} — ABNORMAL HIGH

## 2020-03-05 ENCOUNTER — Other Ambulatory Visit: Payer: Self-pay | Admitting: Family Medicine

## 2020-03-05 ENCOUNTER — Encounter: Payer: Self-pay | Admitting: Family Medicine

## 2020-03-05 DIAGNOSIS — A530 Latent syphilis, unspecified as early or late: Secondary | ICD-10-CM

## 2020-03-05 LAB — CHLAMYDIA/GONOCOCCUS/TRICHOMONAS, NAA
Chlamydia by NAA: NEGATIVE
Gonococcus by NAA: NEGATIVE
Trich vag by NAA: NEGATIVE

## 2020-03-08 ENCOUNTER — Other Ambulatory Visit: Payer: Self-pay

## 2020-03-08 ENCOUNTER — Other Ambulatory Visit: Payer: BC Managed Care – PPO

## 2020-03-08 DIAGNOSIS — A530 Latent syphilis, unspecified as early or late: Secondary | ICD-10-CM

## 2020-03-11 LAB — T PALLIDUM ANTIBODY, EIA: T pallidum Antibody, EIA: NEGATIVE

## 2020-03-11 LAB — RPR W/REFLEX TO TREPSURE: RPR: NONREACTIVE

## 2020-04-30 ENCOUNTER — Ambulatory Visit: Payer: BC Managed Care – PPO | Admitting: Family Medicine

## 2020-05-03 ENCOUNTER — Ambulatory Visit: Payer: BC Managed Care – PPO | Admitting: Family Medicine

## 2020-05-03 ENCOUNTER — Other Ambulatory Visit: Payer: Self-pay

## 2020-05-03 ENCOUNTER — Encounter: Payer: Self-pay | Admitting: Family Medicine

## 2020-05-03 VITALS — BP 124/58 | HR 97 | Ht 78.0 in | Wt 201.0 lb

## 2020-05-03 DIAGNOSIS — G8929 Other chronic pain: Secondary | ICD-10-CM

## 2020-05-03 DIAGNOSIS — D571 Sickle-cell disease without crisis: Secondary | ICD-10-CM | POA: Diagnosis not present

## 2020-05-03 DIAGNOSIS — Z202 Contact with and (suspected) exposure to infections with a predominantly sexual mode of transmission: Secondary | ICD-10-CM | POA: Diagnosis not present

## 2020-05-03 DIAGNOSIS — R0789 Other chest pain: Secondary | ICD-10-CM

## 2020-05-03 DIAGNOSIS — Z789 Other specified health status: Secondary | ICD-10-CM | POA: Diagnosis not present

## 2020-05-03 DIAGNOSIS — Z09 Encounter for follow-up examination after completed treatment for conditions other than malignant neoplasm: Secondary | ICD-10-CM

## 2020-05-03 LAB — POCT URINALYSIS DIPSTICK
Bilirubin, UA: NEGATIVE
Blood, UA: NEGATIVE
Glucose, UA: NEGATIVE
Ketones, UA: NEGATIVE
Leukocytes, UA: NEGATIVE
Nitrite, UA: NEGATIVE
Protein, UA: NEGATIVE
Spec Grav, UA: 1.02 (ref 1.010–1.025)
Urobilinogen, UA: 2 E.U./dL — AB
pH, UA: 8.5 — AB (ref 5.0–8.0)

## 2020-05-03 NOTE — Progress Notes (Signed)
[facility]   Established Patient Office Visit  Subjective:  Patient ID: Gregory Espinoza, male    DOB: 04-26-1990  Age: 30 y.o. MRN: [id number]  CC:  Chief Complaint  Patient presents with  . Sickle Cell Anemia   HPI Gregory Espinoza is a 30 year old male who presents for Follow Up today.    Patient Active Problem List   Diagnosis Date Noted  . Abdominal pain, right lower quadrant 12/19/2018  . Absolute anemia 12/13/2018  . Hb-SS disease with acute chest syndrome (HCC) 10/24/2018  . Transaminitis 03/13/2018  . Sickle cell disease, type SS (HCC) 04/24/2014   Current Status: Since his last office visit, he is doing well with no complaints. He states that he has chronic chest discomfort. He rates his pain today at 5-6/10. He continues to work out a few days a week, and monitors his activity levels to decrease incidents of pain crisis. He has not had a hospital visit for Sickle Cell Crisis since 06/24/2019 where he was treated and discharged the same day. He is currently taking all medications as prescribed and staying well hydrated. He reports occasional nausea, constipation, dizziness and headaches. He is requesting testing for possible exposure to STD today. He denies fevers, chills, fatigue, recent infections, weight loss, and night sweats. He has not had any visual changes, and falls. No chest pain, heart palpitations, cough and shortness of breath reported. Denies GI problems such as vomiting, and diarrhea. He has no reports of blood in stools, dysuria and hematuria. No depression or anxiety reported today. He is taking all medications as prescribed.    Past Medical History:  Diagnosis Date  . Erythrocytopenia 12/2018  . Hemoglobin S-S disease (HCC)   . 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 Sickle cell anemia (HCC)   . Thrombocytopenia (HCC) 12/2018  . Vitamin D deficiency 12/2018    Past Surgical History:  Procedure  Laterality Date  . DENTAL SURGERY      Family History  Problem Relation Age of Onset  . Hypertension Father   . Sickle cell anemia Brother     Social History   Socioeconomic History  . Marital status: Single    Spouse name: Not on file  . Number of children: Not on file  . Years of education: Not on file  . Highest education level: Not on file  Occupational History  . Not on file  Tobacco Use  . Smoking status: Never Smoker  . Smokeless tobacco: Never Used  Vaping Use  . Vaping Use: Never used  Substance and Sexual Activity  . Alcohol use: No    Alcohol/week: 0.0 standard drinks  . Drug use: No  . Sexual activity: Yes    Birth control/protection: Condom  Other Topics Concern  . Not on file  Social History Narrative  . Not on file   Social Determinants of Health   Financial Resource Strain: Not on file  Food Insecurity: Not on file  Transportation Needs: Not on file  Physical Activity: Not on file  Stress: Not on file  Social Connections: Not on file  Intimate Partner Violence: Not on file    Outpatient Medications Prior to Visit  Medication Sig Dispense Refill  . aspirin EC 81 MG tablet Take 1 tablet (81 mg total) by mouth daily. Swallow whole. 30 tablet 11  . folic acid (FOLVITE) 800 MCG tablet Take by mouth.    . hydroxyurea (HYDREA)  500 MG capsule Take 500 mg by mouth daily. May take with food to minimize GI side effects.    . Multiple Vitamin (ONE-A-DAY 55 PLUS PO) Take by mouth. 1 tablet once a day    . naproxen (NAPROSYN) 500 MG tablet Take 1 tablet (500 mg total) by mouth 2 (two) times daily. 30 tablet 0  . vitamin C (ASCORBIC ACID) 500 MG tablet Take 500 mg by mouth daily.     . Vitamin D, Ergocalciferol, (DRISDOL) 1.25 MG (50000 UNIT) CAPS capsule Take 1 capsule (50,000 Units total) by mouth every 7 (seven) days. X 12 weeks. 5 capsule 2   No facility-administered medications prior to visit.    No Known Allergies  ROS Review of Systems   Constitutional: Negative.   HENT: Negative.   Eyes: Negative.   Respiratory: Negative.   Cardiovascular: Negative.        Occasional chest discomfort  Gastrointestinal: Negative.   Endocrine: Negative.   Genitourinary: Negative.   Musculoskeletal: Positive for arthralgias (chornic joint pain).  Skin: Negative.   Allergic/Immunologic: Negative.   Neurological: Positive for dizziness (occasional ) and headaches (occasional ).  Hematological: Negative.   Psychiatric/Behavioral: Negative.       Objective:    Physical Exam Vitals and nursing note reviewed.  Constitutional:      Appearance: Normal appearance.  HENT:     Head: Normocephalic and atraumatic.     Nose: Nose normal.     Mouth/Throat:     Mouth: Mucous membranes are moist.     Pharynx: Oropharynx is clear.  Cardiovascular:     Rate and Rhythm: Normal rate and regular rhythm.     Pulses: Normal pulses.     Heart sounds: Normal heart sounds.  Pulmonary:     Effort: Pulmonary effort is normal.     Breath sounds: Normal breath sounds.  Abdominal:     General: Bowel sounds are normal.     Palpations: Abdomen is soft.  Musculoskeletal:        General: Normal range of motion.     Cervical back: Normal range of motion and neck supple.  Skin:    General: Skin is warm and dry.  Neurological:     General: No focal deficit present.     Mental Status: He is alert and oriented to person, place, and time.  Psychiatric:        Mood and Affect: Mood normal.        Behavior: Behavior normal.        Thought Content: Thought content normal.        Judgment: Judgment normal.     BP (!) 124/58   Pulse 97   Ht 6\' 6"  (1.981 m)   Wt 201 lb (91.2 kg)   SpO2 (!) 66%   BMI 23.23 kg/m  Wt Readings from Last 3 Encounters:  05/03/20 201 lb (91.2 kg)  03/03/20 202 lb (91.6 kg)  02/03/20 203 lb 3.2 oz (92.2 kg)     Health Maintenance Due  Topic Date Due  . TETANUS/TDAP  Never done    There are no preventive care  reminders to display for this patient.  Lab Results  Component Value Date   TSH 4.339 08/16/2011   Lab Results  Component Value Date   WBC 7.5 02/03/2020   HGB 10.7 (L) 02/03/2020   HCT 30.4 (L) 02/03/2020   MCV 110 (H) 02/03/2020   PLT 419 02/03/2020   Lab Results  Component Value Date  NA 141 02/03/2020   K 4.7 02/03/2020   CO2 22 02/03/2020   GLUCOSE 81 02/03/2020   BUN 9 02/03/2020   CREATININE 0.88 02/03/2020   BILITOT 4.5 (H) 02/03/2020   ALKPHOS 51 02/03/2020   AST 27 02/03/2020   ALT 16 02/03/2020   PROT 8.0 02/03/2020   ALBUMIN 4.7 02/03/2020   CALCIUM 9.6 02/03/2020   ANIONGAP 7 07/27/2018   Lab Results  Component Value Date   CHOL 87 08/16/2011   Lab Results  Component Value Date   HDL 31 (L) 08/16/2011   Lab Results  Component Value Date   LDLCALC 39 08/16/2011   Lab Results  Component Value Date   TRIG 85 08/16/2011   Lab Results  Component Value Date   CHOLHDL 2.8 08/16/2011   No results found for: HGBA1C    Assessment & Plan:   1. Hemoglobin SS disease without crisis [facility]) He is doing well today r/t his chronic pain management. He will continue to take pain medications as prescribed; will continue to avoid extreme heat and cold; will continue to eat a healthy diet and drink at least 64 ounces of water daily; continue stool softener as needed; will avoid colds and flu; will continue to get plenty of sleep and rest; will continue to avoid high stressful situations and remain infection free; will continue Folic Acid 1 mg daily to avoid sickle cell crisis. Continue to follow up with Hematologist as needed.  - Sickle Cell Panel  2. Other chronic pain  3. Chest discomfort Stable today.  4. Opioid naive  5. Possible exposure to STD - T.pallidum Ab, Total - RPR - Chlamydia/Gonococcus/Trichomonas, NAA  6. Follow up He will follow up in 3 months.   Orders Placed This Encounter  Procedures  . Chlamydia/Gonococcus/Trichomonas, NAA  .  T.pallidum Ab, Total  . RPR  . Sickle Cell Panel    Current Outpatient Medications on File Prior to Visit  Medication Sig Dispense Refill  . aspirin EC 81 MG tablet Take 1 tablet (81 mg total) by mouth daily. Swallow whole. 30 tablet 11  . folic acid (FOLVITE) 800 MCG tablet Take by mouth.    . hydroxyurea (HYDREA) 500 MG capsule Take 500 mg by mouth daily. May take with food to minimize GI side effects.    . Multiple Vitamin (ONE-A-DAY 55 PLUS PO) Take by mouth. 1 tablet once a day    . naproxen (NAPROSYN) 500 MG tablet Take 1 tablet (500 mg total) by mouth 2 (two) times daily. 30 tablet 0  . vitamin C (ASCORBIC ACID) 500 MG tablet Take 500 mg by mouth daily.     . Vitamin D, Ergocalciferol, (DRISDOL) 1.25 MG (50000 UNIT) CAPS capsule Take 1 capsule (50,000 Units total) by mouth every 7 (seven) days. X 12 weeks. 5 capsule 2   No current facility-administered medications on file prior to visit.    Referral Orders  No referral(s) requested today    Raliegh Ip, MSN, ANE, FNP-BC [facility]/Internal Medicine/Sickle Cell Center [facility] [address] [place], [place] [address] [phone]- fax  Problem List Items Addressed This Visit      Other   Sickle cell disease, type SS (HCC) - Primary   Relevant Orders   Sickle Cell Panel    Other Visit Diagnoses    Other chronic pain       Chest discomfort       Opioid naive  Possible exposure to STD       Relevant Orders   T.pallidum Ab, Total   RPR   Chlamydia/Gonococcus/Trichomonas, NAA   Follow up          No orders of the defined types were placed in this encounter.   Follow-up: No follow-ups on file.    Kallie LocksNatalie M Leandrea Ackley, FNP

## 2020-05-04 ENCOUNTER — Encounter: Payer: Self-pay | Admitting: Family Medicine

## 2020-05-04 LAB — CMP14+CBC/D/PLT+FER+RETIC+V...
ALT: 13 IU/L (ref 0–44)
AST: 28 IU/L (ref 0–40)
Albumin/Globulin Ratio: 1.4 (ref 1.2–2.2)
Albumin: 4.5 g/dL (ref 4.1–5.2)
Alkaline Phosphatase: 48 IU/L (ref 44–121)
BUN/Creatinine Ratio: 9 (ref 9–20)
BUN: 7 mg/dL (ref 6–20)
Basophils Absolute: 0.1 10*3/uL (ref 0.0–0.2)
Basos: 2 %
Bilirubin Total: 5.7 mg/dL — ABNORMAL HIGH (ref 0.0–1.2)
CO2: 22 mmol/L (ref 20–29)
Calcium: 9.1 mg/dL (ref 8.7–10.2)
Chloride: 105 mmol/L (ref 96–106)
Creatinine, Ser: 0.78 mg/dL (ref 0.76–1.27)
EOS (ABSOLUTE): 0.3 10*3/uL (ref 0.0–0.4)
Eos: 5 %
Ferritin: 101 ng/mL (ref 30–400)
Globulin, Total: 3.3 g/dL (ref 1.5–4.5)
Glucose: 91 mg/dL (ref 65–99)
Hematocrit: 27.9 % — ABNORMAL LOW (ref 37.5–51.0)
Hemoglobin: 9.6 g/dL — ABNORMAL LOW (ref 13.0–17.7)
Immature Grans (Abs): 0 10*3/uL (ref 0.0–0.1)
Immature Granulocytes: 0 %
Lymphocytes Absolute: 3.1 10*3/uL (ref 0.7–3.1)
Lymphs: 44 %
MCH: 37.1 pg — ABNORMAL HIGH (ref 26.6–33.0)
MCHC: 34.4 g/dL (ref 31.5–35.7)
MCV: 108 fL — ABNORMAL HIGH (ref 79–97)
Monocytes Absolute: 0.8 10*3/uL (ref 0.1–0.9)
Monocytes: 12 %
NRBC: 5 % — ABNORMAL HIGH (ref 0–0)
Neutrophils Absolute: 2.6 10*3/uL (ref 1.4–7.0)
Neutrophils: 37 %
Platelets: 140 10*3/uL — ABNORMAL LOW (ref 150–450)
Potassium: 4.4 mmol/L (ref 3.5–5.2)
RBC: 2.59 x10E6/uL — CL (ref 4.14–5.80)
RDW: 18.3 % — ABNORMAL HIGH (ref 11.6–15.4)
Retic Ct Pct: 6.5 % — ABNORMAL HIGH (ref 0.6–2.6)
Sodium: 141 mmol/L (ref 134–144)
Total Protein: 7.8 g/dL (ref 6.0–8.5)
Vit D, 25-Hydroxy: 29.1 ng/mL — ABNORMAL LOW (ref 30.0–100.0)
WBC: 7 10*3/uL (ref 3.4–10.8)
eGFR: 125 mL/min/{1.73_m2} (ref >59)

## 2020-05-04 LAB — RPR: RPR Ser Ql: NONREACTIVE

## 2020-05-05 ENCOUNTER — Encounter: Payer: Self-pay | Admitting: Family Medicine

## 2020-05-05 LAB — T.PALLIDUM AB, TOTAL: T Pallidum Abs: NONREACTIVE

## 2020-05-05 NOTE — Progress Notes (Signed)
Results of STD are negative.   All other lab results previously reviewed with patient.

## 2020-05-06 LAB — CHLAMYDIA/GONOCOCCUS/TRICHOMONAS, NAA
Chlamydia by NAA: NEGATIVE
Gonococcus by NAA: NEGATIVE
Trich vag by NAA: NEGATIVE

## 2020-05-12 DIAGNOSIS — Z79899 Other long term (current) drug therapy: Secondary | ICD-10-CM | POA: Diagnosis not present

## 2020-05-12 DIAGNOSIS — D571 Sickle-cell disease without crisis: Secondary | ICD-10-CM | POA: Diagnosis not present

## 2020-05-12 DIAGNOSIS — E559 Vitamin D deficiency, unspecified: Secondary | ICD-10-CM | POA: Diagnosis not present

## 2020-07-02 ENCOUNTER — Emergency Department (HOSPITAL_COMMUNITY)
Admission: EM | Admit: 2020-07-02 | Discharge: 2020-07-02 | Disposition: A | Discharge status: Home / Self Care | Payer: BC Managed Care – PPO | Attending: Emergency Medicine | Admitting: Emergency Medicine

## 2020-07-02 ENCOUNTER — Emergency Department (HOSPITAL_COMMUNITY): Payer: BC Managed Care – PPO

## 2020-07-02 ENCOUNTER — Encounter (HOSPITAL_COMMUNITY): Payer: Self-pay

## 2020-07-02 ENCOUNTER — Ambulatory Visit (HOSPITAL_COMMUNITY): Admission: EM | Admit: 2020-07-02 | Discharge: 2020-07-02 | Payer: BC Managed Care – PPO

## 2020-07-02 ENCOUNTER — Other Ambulatory Visit: Payer: Self-pay

## 2020-07-02 DIAGNOSIS — R109 Unspecified abdominal pain: Secondary | ICD-10-CM

## 2020-07-02 DIAGNOSIS — Z7982 Long term (current) use of aspirin: Secondary | ICD-10-CM | POA: Diagnosis not present

## 2020-07-02 DIAGNOSIS — D5700 Hb-SS disease with crisis, unspecified: Secondary | ICD-10-CM

## 2020-07-02 DIAGNOSIS — D696 Thrombocytopenia, unspecified: Secondary | ICD-10-CM | POA: Diagnosis not present

## 2020-07-02 DIAGNOSIS — R161 Splenomegaly, not elsewhere classified: Secondary | ICD-10-CM | POA: Diagnosis not present

## 2020-07-02 DIAGNOSIS — Z9049 Acquired absence of other specified parts of digestive tract: Secondary | ICD-10-CM | POA: Insufficient documentation

## 2020-07-02 DIAGNOSIS — R1012 Left upper quadrant pain: Secondary | ICD-10-CM | POA: Diagnosis not present

## 2020-07-02 LAB — COMPREHENSIVE METABOLIC PANEL
ALT: 18 U/L (ref 0–44)
AST: 32 U/L (ref 15–41)
Albumin: 3.9 g/dL (ref 3.5–5.0)
Alkaline Phosphatase: 39 U/L (ref 38–126)
Anion gap: 3 — ABNORMAL LOW (ref 5–15)
BUN: 7 mg/dL (ref 6–20)
CO2: 28 mmol/L (ref 22–32)
Calcium: 9.1 mg/dL (ref 8.9–10.3)
Chloride: 107 mmol/L (ref 98–111)
Creatinine, Ser: 0.73 mg/dL (ref 0.61–1.24)
GFR, Estimated: >60 mL/min (ref >60)
Glucose, Bld: 91 mg/dL (ref 70–99)
Potassium: 4.1 mmol/L (ref 3.5–5.1)
Sodium: 138 mmol/L (ref 135–145)
Total Bilirubin: 4.5 mg/dL — ABNORMAL HIGH (ref 0.3–1.2)
Total Protein: 7.3 g/dL (ref 6.5–8.1)

## 2020-07-02 LAB — RETICULOCYTES
Immature Retic Fract: 30.9 % — ABNORMAL HIGH (ref 2.3–15.9)
RBC.: 2.58 MIL/uL — ABNORMAL LOW (ref 4.22–5.81)
Retic Count, Absolute: 209 10*3/uL — ABNORMAL HIGH (ref 19.0–186.0)
Retic Ct Pct: 8.1 % — ABNORMAL HIGH (ref 0.4–3.1)

## 2020-07-02 LAB — CBC WITH DIFFERENTIAL/PLATELET
Abs Immature Granulocytes: 0.02 10*3/uL (ref 0.00–0.07)
Basophils Absolute: 0.1 10*3/uL (ref 0.0–0.1)
Basophils Relative: 1 %
Eosinophils Absolute: 0.2 10*3/uL (ref 0.0–0.5)
Eosinophils Relative: 4 %
HCT: 27 % — ABNORMAL LOW (ref 39.0–52.0)
Hemoglobin: 9.5 g/dL — ABNORMAL LOW (ref 13.0–17.0)
Immature Granulocytes: 0 %
Lymphocytes Relative: 36 %
Lymphs Abs: 2.3 10*3/uL (ref 0.7–4.0)
MCH: 37.1 pg — ABNORMAL HIGH (ref 26.0–34.0)
MCHC: 35.2 g/dL (ref 30.0–36.0)
MCV: 105.5 fL — ABNORMAL HIGH (ref 80.0–100.0)
Monocytes Absolute: 0.6 10*3/uL (ref 0.1–1.0)
Monocytes Relative: 10 %
Neutro Abs: 3.2 10*3/uL (ref 1.7–7.7)
Neutrophils Relative %: 49 %
Platelets: 103 10*3/uL — ABNORMAL LOW (ref 150–400)
RBC: 2.56 MIL/uL — ABNORMAL LOW (ref 4.22–5.81)
RDW: 18.5 % — ABNORMAL HIGH (ref 11.5–15.5)
WBC: 6.4 10*3/uL (ref 4.0–10.5)
nRBC: 6.5 % — ABNORMAL HIGH (ref 0.0–0.2)

## 2020-07-02 LAB — URINALYSIS, ROUTINE W REFLEX MICROSCOPIC
Bilirubin Urine: NEGATIVE
Glucose, UA: NEGATIVE mg/dL
Hgb urine dipstick: NEGATIVE
Ketones, ur: NEGATIVE mg/dL
Leukocytes,Ua: NEGATIVE
Nitrite: NEGATIVE
Protein, ur: NEGATIVE mg/dL
Specific Gravity, Urine: 1.008 (ref 1.005–1.030)
pH: 7 (ref 5.0–8.0)

## 2020-07-02 MED ORDER — ONDANSETRON HCL 4 MG/2ML IJ SOLN
4.0000 mg | Freq: Once | INTRAMUSCULAR | Status: DC
  Filled 2020-07-02: qty 2

## 2020-07-02 MED ORDER — ACETAMINOPHEN 500 MG PO TABS
1000.0000 mg | ORAL_TABLET | Freq: Once | ORAL | Status: AC
  Administered 2020-07-02: 1000 mg via ORAL
  Filled 2020-07-02: qty 2

## 2020-07-02 MED ORDER — HYDROMORPHONE HCL 1 MG/ML IJ SOLN
1.0000 mg | Freq: Once | INTRAMUSCULAR | Status: DC
  Filled 2020-07-02: qty 1

## 2020-07-02 MED ORDER — IOHEXOL 300 MG/ML  SOLN
75.0000 mL | Freq: Once | INTRAMUSCULAR | Status: AC | PRN
  Administered 2020-07-02: 75 mL via INTRAVENOUS

## 2020-07-02 NOTE — ED Triage Notes (Signed)
Pt reports pain in right side of abdomen starting yesterday around noon worsening since then. Reports fatigue. States pain worsens with sneezing, coughing, cannot lay on side or stomach due to the pain.

## 2020-07-02 NOTE — ED Provider Notes (Signed)
[facility]    CSN: [id number] Arrival date & time: 07/02/20  0810      History   Chief Complaint Chief Complaint  Patient presents with  . Abdominal Pain  . Fatigue    HPI Gregory Espinoza is a 30 y.o. male.   Patient presents today with a 2-day history of left-sided abdominal pain. He denies any change in activity prior to symptom onset.  Reports pain is rated 5 at rest but increases to 8 with certain activities including sneezing, described as dull with periodic sharp pains, worse with palpation or laying on his left side, no alleviating factors identified.  He denies any recent medication changes.  He does report decreased number of bowel movements as he typically goes several times per day last bowel movement was approximately 16 hours ago.  He denies any nausea, vomiting, fever, chest pain, shortness of breath, urinary symptoms, hematuria.  Denies history of nephrolithiasis.  Denies any recent travel.  He does have a history of sickle cell anemia but has not had a crisis in several years.  He is followed closely by hematology at [facility] and takes medication as prescribed without missing doses.  Patient denies previous abdominal surgery including splenectomy.     Past Medical History:  Diagnosis Date  . Erythrocytopenia 12/2018  . Hemoglobin S-S disease (HCC)   . 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 Sickle cell anemia (HCC)   . Thrombocytopenia (HCC) 12/2018  . Vitamin D deficiency 12/2018    Patient Active Problem List   Diagnosis Date Noted  . Abdominal pain, right lower quadrant 12/19/2018  . Absolute anemia 12/13/2018  . Hb-SS disease with acute chest syndrome (HCC) 10/24/2018  . Transaminitis 03/13/2018  . Sickle cell disease, type SS (HCC) 04/24/2014    Past Surgical History:  Procedure Laterality Date  . DENTAL SURGERY         Home Medications    Prior to Admission medications   Medication Sig Start Date End Date  Taking? Authorizing Provider  aspirin EC 81 MG tablet Take 1 tablet (81 mg total) by mouth daily. Swallow whole. 02/03/20  Yes Kallie Locks, FNP  folic acid (FOLVITE) 800 MCG tablet Take by mouth. 10/24/18  Yes [provider]  hydroxyurea (HYDREA) 500 MG capsule Take 500 mg by mouth daily. May take with food to minimize GI side effects.   Yes [provider]  Loratadine (CLARITIN PO) Take by mouth as needed.   Yes [provider]  Multiple Vitamin (ONE-A-DAY 55 PLUS PO) Take by mouth. 1 tablet once a day   Yes [provider]  naproxen (NAPROSYN) 500 MG tablet Take 1 tablet (500 mg total) by mouth 2 (two) times daily. 06/14/18   Dahlia Byes A, NP  vitamin C (ASCORBIC ACID) 500 MG tablet Take 500 mg by mouth daily.     [provider]  Vitamin D, Ergocalciferol, (DRISDOL) 1.25 MG (50000 UNIT) CAPS capsule Take 1 capsule (50,000 Units total) by mouth every 7 (seven) days. X 12 weeks. 02/06/20   Kallie Locks, FNP    Family History Family History  Problem Relation Age of Onset  . Hypertension Mother   . Hypertension Father   . Sickle cell anemia Brother     Social History Social History   Tobacco Use  . Smoking status: Never Smoker  . Smokeless tobacco: Never Used  Vaping Use  . Vaping Use: Never used  Substance Use Topics  .  Alcohol use: No    Alcohol/week: 0.0 standard drinks  . Drug use: No     Allergies   Patient has no known allergies.   Review of Systems Review of Systems  Constitutional: Positive for activity change. Negative for appetite change, fatigue and fever.  Respiratory: Negative for cough and shortness of breath.   Cardiovascular: Negative for chest pain.  Gastrointestinal: Positive for abdominal pain and constipation. Negative for diarrhea, nausea and vomiting.  Genitourinary: Negative for dysuria, frequency, hematuria and urgency.  Musculoskeletal: Negative for arthralgias and myalgias.  Neurological:  Negative for dizziness, light-headedness and headaches.     Physical Exam Triage Vital Signs ED Triage Vitals [07/02/20 0843]  Enc Vitals Group     BP 117/68     Pulse Rate 66     Resp 18     Temp 98.2 F (36.8 C)     Temp src      SpO2 97 %     Weight      Height      Head Circumference      Peak Flow      Pain Score      Pain Loc      Pain Edu?      Excl. in GC?    No data found.  Updated Vital Signs BP 117/68   Pulse 66   Temp 98.2 F (36.8 C)   Resp 18   SpO2 97%   Visual Acuity Right Eye Distance:   Left Eye Distance:   Bilateral Distance:    Right Eye Near:   Left Eye Near:    Bilateral Near:     Physical Exam Vitals reviewed.  Constitutional:      General: He is awake.     Appearance: Normal appearance. He is normal weight. He is not ill-appearing.     Comments: Very pleasant male appears stated age in no acute distress  HENT:     Head: Normocephalic and atraumatic.  Cardiovascular:     Rate and Rhythm: Normal rate and regular rhythm.     Heart sounds: Normal heart sounds. No murmur heard.   Pulmonary:     Effort: Pulmonary effort is normal.     Breath sounds: Normal breath sounds. No stridor. No wheezing, rhonchi or rales.     Comments: Clear to auscultation bilaterally Abdominal:     General: Bowel sounds are normal.     Palpations: Abdomen is soft. There is splenomegaly.     Tenderness: There is abdominal tenderness in the left upper quadrant and left lower quadrant.  Neurological:     Mental Status: He is alert.  Psychiatric:        Behavior: Behavior is cooperative.      UC Treatments / Results  Labs (all labs ordered are listed, but only abnormal results are displayed) Labs Reviewed - No data to display  EKG   Radiology No results found.  Procedures Procedures (including critical care time)  Medications Ordered in UC Medications - No data to display  Initial Impression / Assessment and Plan / UC Course  I have  reviewed the triage vital signs and the nursing notes.  Pertinent labs & imaging results that were available during my care of the patient were reviewed by me and considered in my medical decision making (see chart for details).     Concern for splenic sequestration given clinical presentation.  Unfortunately, we are unable to obtain imaging in urgent care and so we will send  patient to emergency room for further evaluation management.  Patient is agreeable to this and will go directly to [facility] emergency room following visit.  Vital signs were stable at the time of discharge and patient is safe for private transport.  We will go directly to ER via private transport.  Final Clinical Impressions(s) / UC Diagnoses   Final diagnoses:  Left sided abdominal pain  Splenomegaly  Hemoglobin SS disease with crisis [facility])     Discharge Instructions     GO TO ER.     ED Prescriptions    None     PDMP not reviewed this encounter.   Jeani Hawking, PA-C 07/02/20 1761

## 2020-07-02 NOTE — Discharge Instructions (Signed)
GO TO ER °

## 2020-07-02 NOTE — ED Notes (Signed)
Patient is being discharged from the Urgent Care and sent to the Emergency Department via personal vehicle . Per provider Dorann Ou, patient is in need of higher level of care due to sickle cell anemia. Patient is aware and verbalizes understanding of plan of care.   Vitals:   07/02/20 0843  BP: 117/68  Pulse: 66  Resp: 18  Temp: 98.2 F (36.8 C)  SpO2: 97%

## 2020-07-02 NOTE — ED Notes (Signed)
Pt receiving ultrasound in ED rm 22

## 2020-07-02 NOTE — ED Provider Notes (Signed)
[facility] EMERGENCY DEPARTMENT Provider Note   CSN: [id number] Arrival date & time: 07/02/20  7829     History No chief complaint on file.   Gregory Espinoza is a 30 y.o. male.  30 year old male with history of sickle cell anemia, presents with complaint of left side abdominal pain onset yesterday, worse with coughing, sneezing, bending over, reports left side abdominal fullness. Last bowel movement was yesterday, denies fevers, recent URI/sore throat, nausea, vomiting, or any other complaints or concerns. Prior cholecystectomy, no other prior abdominal surgeries, still has a spleen.         Past Medical History:  Diagnosis Date  . Erythrocytopenia 12/2018  . Hemoglobin S-S disease (HCC)   . 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 Sickle cell anemia (HCC)   . Thrombocytopenia (HCC) 12/2018  . Vitamin D deficiency 12/2018    Patient Active Problem List   Diagnosis Date Noted  . Abdominal pain, right lower quadrant 12/19/2018  . Absolute anemia 12/13/2018  . Hb-SS disease with acute chest syndrome (HCC) 10/24/2018  . Transaminitis 03/13/2018  . Sickle cell disease, type SS (HCC) 04/24/2014    Past Surgical History:  Procedure Laterality Date  . DENTAL SURGERY         Family History  Problem Relation Age of Onset  . Hypertension Mother   . Hypertension Father   . Sickle cell anemia Brother     Social History   Tobacco Use  . Smoking status: Never Smoker  . Smokeless tobacco: Never Used  Vaping Use  . Vaping Use: Never used  Substance Use Topics  . Alcohol use: No    Alcohol/week: 0.0 standard drinks  . Drug use: No    Home Medications Prior to Admission medications   Medication Sig Start Date End Date Taking? Authorizing Provider  aspirin EC 81 MG tablet Take 1 tablet (81 mg total) by mouth daily. Swallow whole. 02/03/20   Kallie Locks, FNP  folic acid (FOLVITE) 800 MCG tablet Take by mouth. 10/24/18   [provider]  hydroxyurea (HYDREA) 500 MG capsule Take 500 mg by mouth daily. May take with food to minimize GI side effects.    [provider]  Loratadine (CLARITIN PO) Take by mouth as needed.    [provider]  Multiple Vitamin (ONE-A-DAY 55 PLUS PO) Take by mouth. 1 tablet once a day    [provider]  naproxen (NAPROSYN) 500 MG tablet Take 1 tablet (500 mg total) by mouth 2 (two) times daily. 06/14/18   Dahlia Byes A, NP  vitamin C (ASCORBIC ACID) 500 MG tablet Take 500 mg by mouth daily.     [provider]  Vitamin D, Ergocalciferol, (DRISDOL) 1.25 MG (50000 UNIT) CAPS capsule Take 1 capsule (50,000 Units total) by mouth every 7 (seven) days. X 12 weeks. 02/06/20   Kallie Locks, FNP    Allergies    Patient has no known allergies.  Review of Systems   Review of Systems  Constitutional: Positive for fatigue. Negative for chills and fever.  Respiratory: Negative for shortness of breath.   Cardiovascular: Negative for chest pain.  Gastrointestinal: Positive for abdominal pain. Negative for constipation, diarrhea, nausea and vomiting.  Genitourinary: Negative for difficulty urinating and dysuria.  Musculoskeletal: Positive for back pain. Negative for arthralgias and myalgias.  Skin: Negative for rash and wound.  Neurological: Negative for weakness.  Hematological: Negative for adenopathy.  Psychiatric/Behavioral: Negative for confusion.  All other systems reviewed and are negative.   Physical Exam Updated Vital Signs BP 118/76 (BP Location: Left Arm)   Pulse 63   Temp (!) 97.4 F (36.3 C) (Oral)   Resp 16   SpO2 99%   Physical Exam Vitals and nursing note reviewed.  Constitutional:      General: He is not in acute distress.    Appearance: He is well-developed. He is not diaphoretic.  HENT:     Head: Normocephalic and atraumatic.  Cardiovascular:     Rate and Rhythm: Normal rate and regular rhythm.     Pulses: Normal pulses.      Heart sounds: Normal heart sounds.  Pulmonary:     Effort: Pulmonary effort is normal.     Breath sounds: Normal breath sounds.  Abdominal:     Palpations: There is splenomegaly.     Tenderness: There is abdominal tenderness in the left upper quadrant.  Musculoskeletal:     Cervical back: Neck supple.     Right lower leg: No edema.     Left lower leg: No edema.  Skin:    General: Skin is warm and dry.     Findings: No erythema or rash.  Neurological:     Mental Status: He is alert and oriented to person, place, and time.  Psychiatric:        Behavior: Behavior normal.     ED Results / Procedures / Treatments   Labs (all labs ordered are listed, but only abnormal results are displayed) Labs Reviewed  COMPREHENSIVE METABOLIC PANEL  CBC WITH DIFFERENTIAL/PLATELET  URINALYSIS, ROUTINE W REFLEX MICROSCOPIC    EKG None  Radiology No results found.  Procedures Procedures   Medications Ordered in ED Medications - No data to display  ED Course  I have reviewed the triage vital signs and the nursing notes.  Pertinent labs & imaging results that were available during my care of the patient were reviewed by me and considered in my medical decision making (see chart for details).  Clinical Course as of 07/02/20 1425  Fri Jul 02, 2020  671 30 year old male with history of sickle cell anemia presents with complaint of left upper quadrant abdominal pain onset yesterday.  Exam in urgent care concerning for splenomegaly, also felt to have a large spleen in ED versus mass or fullness of the left upper abdomen.  Ultrasound of the spleen is normal.  CT abdomen pelvis with contrast shows known cholelithiasis, no other acute abnormality.  Labs without significant change from prior including hemoglobin of 9.5.  Patient requested pain medication, Dilaudid was ordered however has declined and is requesting Tylenol which was ordered. Work-up in the ER has been unremarkable, recommend MiraLAX  and Colace and follow-up with PCP next week for further evaluation with return to ER precautions. [LM]    Clinical Course User Index [LM] Alden Hipp   MDM Rules/Calculators/A&P                          Final Clinical Impression(s) / ED Diagnoses LUQ abdominal pain   Rx / DC Orders ED Discharge Orders    None       Alden Hipp 07/02/20 1426    Lorre Nick, MD 07/04/20 [phone]

## 2020-07-02 NOTE — ED Notes (Signed)
Pt refuses zofran and dilaudid, requests 1G of tylenol, MD notified

## 2020-07-02 NOTE — ED Notes (Signed)
Patient transported to CT via transport 

## 2020-07-02 NOTE — ED Notes (Signed)
Pt requesting to speak to PA regarding CT results. PA notified and she states she will be with pt shortly.  Pt aware

## 2020-07-02 NOTE — ED Notes (Signed)
Spoke with PA

## 2020-07-02 NOTE — Discharge Instructions (Addendum)
Use Colace and MiraLAX throughout the weekend.  Follow-up with your doctor for recheck as discussed.  Return to ED for new or worsening symptoms.

## 2020-07-07 ENCOUNTER — Ambulatory Visit (INDEPENDENT_AMBULATORY_CARE_PROVIDER_SITE_OTHER): Payer: BC Managed Care – PPO | Admitting: Nurse Practitioner

## 2020-07-07 ENCOUNTER — Encounter: Payer: Self-pay | Admitting: Gastroenterology

## 2020-07-07 VITALS — BP 121/61 | HR 78 | Temp 98.8°F | Resp 18

## 2020-07-07 DIAGNOSIS — R1032 Left lower quadrant pain: Secondary | ICD-10-CM

## 2020-07-07 DIAGNOSIS — R1031 Right lower quadrant pain: Secondary | ICD-10-CM

## 2020-07-07 DIAGNOSIS — R1904 Left lower quadrant abdominal swelling, mass and lump: Secondary | ICD-10-CM

## 2020-07-07 MED ORDER — OMEPRAZOLE 20 MG PO CPDR: 20.0000 mg | DELAYED_RELEASE_CAPSULE | Freq: Every day | ORAL | 3 refills | Status: DC

## 2020-07-07 NOTE — Progress Notes (Signed)
@Patient  ID: , male    DOB: [birth year], 30 y.o.   MRN: [id number]  Chief Complaint  Patient presents with   Abdominal Pain    Referring provider: No ref. provider found  HPI  Patient presents today for ED follow-up.  Patient was seen in the ED on 07/02/2020 after being sent there from urgent care.  Patient has been having left upper and lower quadrant abdominal pain.  Patient did have an abdominal ultrasound and CT completed in the ED which were both negative.  Patient has been taking Colace and MiraLAX as directed.  He has also been taking Prilosec and states that this is been helping.  He states that his pain has lessened but has not went away.  Exam in urgent care and an this office visit today was concerning for abdominal mass.  Discussed with patient that we will refer him to GI for further evaluation. Denies f/c/s, n/v/d, hemoptysis, PND, chest pain or edema.    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   aspirin EC 81 MG tablet Take 1 tablet (81 mg total) by mouth daily. Swallow whole.   folic acid (FOLVITE) 800 MCG tablet Take by mouth.   hydroxyurea (HYDREA) 500 MG  capsule Take 500 mg by mouth daily. May take with food to minimize GI side effects.   Loratadine (CLARITIN PO) Take by mouth as needed.   Multiple Vitamin (ONE-A-DAY 55 PLUS PO) Take by mouth. 1 tablet once a day   naproxen (NAPROSYN) 500 MG tablet Take 1 tablet (500 mg total) by mouth 2 (two) times daily.   vitamin C (ASCORBIC ACID) 500 MG tablet Take 500 mg by mouth daily.    Vitamin D, Ergocalciferol, (DRISDOL) 1.25 MG (50000 UNIT) CAPS capsule Take 1 capsule (50,000 Units total) by mouth every 7 (seven) days. X 12 weeks.   No facility-administered encounter medications on file as of 07/07/2020.     Review of Systems  Review of Systems  Constitutional: Negative.   HENT: Negative.    Respiratory:  Negative for cough and shortness of breath.   Cardiovascular: Negative.   Gastrointestinal:  Positive for abdominal pain.  Allergic/Immunologic: Negative.   Neurological: Negative.   Psychiatric/Behavioral: Negative.        Physical Exam  BP 121/61   Pulse 78   Temp 98.8 F (37.1 C)   Resp 18   SpO2 99%   Wt Readings from Last 5 Encounters:  07/02/20 200 lb (90.7 kg)  05/03/20 201 lb (91.2 kg)  03/03/20 202 lb (91.6 kg)  02/03/20 203 lb 3.2 oz (92.2 kg)  10/31/19 203 lb (92.1 kg)     Physical Exam Vitals and nursing note reviewed.  Constitutional:  General: He is not in acute distress.    Appearance: He is well-developed.  Cardiovascular:     Rate and Rhythm: Normal rate and regular rhythm.  Pulmonary:     Effort: Pulmonary effort is normal.     Breath sounds: Normal breath sounds.  Abdominal:     General: Bowel sounds are normal.     Tenderness: There is abdominal tenderness in the left upper quadrant and left lower quadrant.  Skin:    General: Skin is warm and dry.  Neurological:     Mental Status: He is alert and oriented to person, place, and time.      Imaging: CT Abdomen Pelvis W Contrast  Result Date: 07/02/2020 CLINICAL DATA:  Acute left upper  quadrant abdominal pain. EXAM: CT ABDOMEN AND PELVIS WITH CONTRAST TECHNIQUE: Multidetector CT imaging of the abdomen and pelvis was performed using the standard protocol following bolus administration of intravenous contrast. CONTRAST:  73mL OMNIPAQUE IOHEXOL 300 MG/ML  SOLN COMPARISON:  None. FINDINGS: Lower chest: No acute abnormality. Hepatobiliary: Cholelithiasis is noted without biliary dilatation. The liver is unremarkable. Pancreas: Unremarkable. No pancreatic ductal dilatation or surrounding inflammatory changes. Spleen: Normal in size without focal abnormality. Adrenals/Urinary Tract: Adrenal glands are unremarkable. Kidneys are normal, without renal calculi, focal lesion, or hydronephrosis. Bladder is unremarkable. Stomach/Bowel: Stomach is within normal limits. Appendix appears normal. No evidence of bowel wall thickening, distention, or inflammatory changes. Vascular/Lymphatic: No significant vascular findings are present. No enlarged abdominal or pelvic lymph nodes. Reproductive: Prostate is unremarkable. Other: No abdominal wall hernia or abnormality. No abdominopelvic ascites. Musculoskeletal: No acute or significant osseous findings. IMPRESSION: Cholelithiasis. No other abnormality seen in the abdomen or pelvis. Electronically Signed   By: Lupita Raider M.D.   On: 07/02/2020 13:30   US Abdomen Limited  Result Date: 07/02/2020 CLINICAL DATA:  Left upper quadrant pain since yesterday, splenomegaly, thrombocytopenia, history of sickle cell anemia EXAM: ULTRASOUND ABDOMEN LIMITED COMPARISON:  None. FINDINGS: Ultrasound evaluation of the left upper quadrant was performed. The spleen measures 6.2 x 6.7 x 5.8 cm with a volume of 125 cc. IMPRESSION: Normal size spleen. Electronically Signed   By: Maudry Mayhew MD   On: 07/02/2020 10:38      Assessment & Plan:   Abdominal pain, right lower quadrant Abdominal Mass LLQ to LLQ:  Continue stool softener as prescribed through ED  Will order  Prilosec  May start probiotic   High fiber / bland diet recommended  Will place referral to GI - further evaluation Patient Instructions  Abdominal Pain LLQ to LUQ Abdominal Mass LLQ to LLQ:  Continue stool softener as prescribed through ED  Will order Prilosec  May start probiotic   High fiber / bland diet recommended  Will place referral to GI - further evaluation   Bland Diet A bland diet consists of foods that are often soft and do not have a lot of fat, fiber, or extra seasonings. Foods without fat, fiber, or seasoning are easier for the body to digest. They are also less likely to irritate your mouth, throat, stomach, and other parts of your digestive system. A bland diet is sometimes called a BRAT diet. What is my plan? Your health care provider or food and nutrition specialist (dietitian) may recommend specific changes to your diet to prevent symptoms or to treat your symptoms. These changes may include: Eating small meals often. Cooking food until it is soft enough to chew easily. Chewing your food well. Drinking fluids slowly. Not  eating foods that are very spicy, sour, or fatty. Not eating citrus fruits, such as oranges and grapefruit. What do I need to know about this diet? Eat a variety of foods from the bland diet food list. Do not follow a bland diet longer than needed. Ask your health care provider whether you should take vitamins or supplements. What foods can I eat? Grains Hot cereals, such as cream of wheat. Rice. Bread, crackers, or tortillas made from refined white flour.   Vegetables Canned or cooked vegetables. Mashed or boiled potatoes. Fruits Bananas. Applesauce. Other types of cooked or canned fruit with the skin and seeds removed, such as canned peaches or pears.   Meats and other proteins Scrambled eggs. Creamy peanut butter or other nut butters. Lean, well-cooked meats, such as chicken or fish. Tofu. Soups or broths.   Dairy Low-fat dairy  products, such as milk, cottage cheese, or yogurt. Beverages Water. Herbal tea. Apple juice.   Fats and oils Mild salad dressings. Canola or olive oil. Sweets and desserts Pudding. Custard. Fruit gelatin. Ice cream. The items listed above may not be a complete list of recommended foods and beverages. Contact a dietitian for more options. What foods are not recommended? Grains Whole grain breads and cereals. Vegetables Raw vegetables. Fruits Raw fruits, especially citrus, berries, or dried fruits. Dairy Whole fat dairy foods. Beverages Caffeinated drinks. Alcohol. Seasonings and condiments Strongly flavored seasonings or condiments. Hot sauce. Salsa. Other foods Spicy foods. Fried foods. Sour foods, such as pickled or fermented foods. Foods with high sugar content. Foods high in fiber. The items listed above may not be a complete list of foods and beverages to avoid. Contact a dietitian for more information. Summary A bland diet consists of foods that are often soft and do not have a lot of fat, fiber, or extra seasonings. Foods without fat, fiber, or seasoning are easier for the body to digest. Check with your health care provider to see how long you should follow this diet plan. It is not meant to be followed for long periods. This information is not intended to replace advice given to you by your health care provider. Make sure you discuss any questions you have with your health care provider. Document Revised: 02/14/2017 Document Reviewed: 02/14/2017 Elsevier Patient Education  [address]    Ivonne Andrew, [place] 07/08/2020

## 2020-07-07 NOTE — Patient Instructions (Signed)
Abdominal Pain LLQ to LUQ Abdominal Mass LLQ to LLQ:  Continue stool softener as prescribed through ED  Will order Prilosec  May start probiotic   High fiber / bland diet recommended  Will place referral to GI - further evaluation   Bland Diet A bland diet consists of foods that are often soft and do not have a lot of fat, fiber, or extra seasonings. Foods without fat, fiber, or seasoning are easier for the body to digest. They are also less likely to irritate your mouth, throat, stomach, and other parts of your digestive system. A bland diet is sometimes called a BRAT diet. What is my plan? Your health care provider or food and nutrition specialist (dietitian) may recommend specific changes to your diet to prevent symptoms or to treat your symptoms. These changes may include:  Eating small meals often.  Cooking food until it is soft enough to chew easily.  Chewing your food well.  Drinking fluids slowly.  Not eating foods that are very spicy, sour, or fatty.  Not eating citrus fruits, such as oranges and grapefruit. What do I need to know about this diet?  Eat a variety of foods from the bland diet food list.  Do not follow a bland diet longer than needed.  Ask your health care provider whether you should take vitamins or supplements. What foods can I eat? Grains Hot cereals, such as cream of wheat. Rice. Bread, crackers, or tortillas made from refined white flour.   Vegetables Canned or cooked vegetables. Mashed or boiled potatoes. Fruits Bananas. Applesauce. Other types of cooked or canned fruit with the skin and seeds removed, such as canned peaches or pears.   Meats and other proteins Scrambled eggs. Creamy peanut butter or other nut butters. Lean, well-cooked meats, such as chicken or fish. Tofu. Soups or broths.   Dairy Low-fat dairy products, such as milk, cottage cheese, or yogurt. Beverages Water. Herbal tea. Apple juice.   Fats and oils Mild salad  dressings. Canola or olive oil. Sweets and desserts Pudding. Custard. Fruit gelatin. Ice cream. The items listed above may not be a complete list of recommended foods and beverages. Contact a dietitian for more options. What foods are not recommended? Grains Whole grain breads and cereals. Vegetables Raw vegetables. Fruits Raw fruits, especially citrus, berries, or dried fruits. Dairy Whole fat dairy foods. Beverages Caffeinated drinks. Alcohol. Seasonings and condiments Strongly flavored seasonings or condiments. Hot sauce. Salsa. Other foods Spicy foods. Fried foods. Sour foods, such as pickled or fermented foods. Foods with high sugar content. Foods high in fiber. The items listed above may not be a complete list of foods and beverages to avoid. Contact a dietitian for more information. Summary  A bland diet consists of foods that are often soft and do not have a lot of fat, fiber, or extra seasonings.  Foods without fat, fiber, or seasoning are easier for the body to digest.  Check with your health care provider to see how long you should follow this diet plan. It is not meant to be followed for long periods. This information is not intended to replace advice given to you by your health care provider. Make sure you discuss any questions you have with your health care provider. Document Revised: 02/14/2017 Document Reviewed: 02/14/2017 Elsevier Patient Education  2021 [employer].

## 2020-07-08 NOTE — Assessment & Plan Note (Signed)
Abdominal Mass LLQ to LLQ:  Continue stool softener as prescribed through ED  Will order Prilosec  May start probiotic   High fiber / bland diet recommended  Will place referral to GI - further evaluation

## 2020-07-12 ENCOUNTER — Encounter: Payer: Self-pay | Admitting: Nurse Practitioner

## 2020-07-12 ENCOUNTER — Telehealth (INDEPENDENT_AMBULATORY_CARE_PROVIDER_SITE_OTHER): Payer: BC Managed Care – PPO | Admitting: Nurse Practitioner

## 2020-07-12 DIAGNOSIS — D571 Sickle-cell disease without crisis: Secondary | ICD-10-CM | POA: Diagnosis not present

## 2020-07-12 DIAGNOSIS — R1032 Left lower quadrant pain: Secondary | ICD-10-CM | POA: Diagnosis not present

## 2020-07-12 MED ORDER — SALONPAS DEEP RELIEVING 3.1-10-15 % EX GEL: 1.0000 | Freq: Three times a day (TID) | CUTANEOUS | 11 refills | Status: DC

## 2020-07-12 NOTE — Progress Notes (Signed)
   [facility] Patient [facility] [address] [place], [place]  [address] Phone:  [phone]   Fax:  [phone] Virtual Visit via Telephone Note  I connected with Gregory Espinoza on 07/15/20 at  3:20 PM EDT by telephoine and verified that I am speaking with the correct person using two identifiers.   I discussed the limitations, risks, security and privacy concerns of performing an evaluation and management service by telephone and the availability of in person appointments. I also discussed with the patient that there may be a patient responsible charge related to this service. The patient expressed understanding and agreed to proceed.  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   Abdominal Pain Patient complains of abdominal pain. The pain is described as dull, and is 4/10 in intensity. The patient is experiencing LLQ pain with radiation to chest and left back. Onset was 10 days ago initially  the pain was 7/10. Symptoms have been gradually improving. Aggravating factors: movement and lifting.  Alleviating factors: proton pump inhibitors. Associated symptoms: belching and flatus. The patient denies arthralagias, dysuria, fever, headache, hematochezia, hematuria, myalgias, nausea, sweats, and vomiting.  Last BM was 07/12/20 there is pain with straining. He is not straining. His normal pattern 2-3 times per day. This is everyday .  He reports that tomatoes are the only seedy food.   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 A diagnosis of Left lower quadrant abdominal pain was  also pertinent to this visit.  Visit Diagnosis: 1. Hemoglobin SS disease without crisis (HCC)  Stable We discussed the need for good hydration, monitoring of hydration status, avoidance of heat, cold, stress, and infection triggers. We discussed the risks and benefits of Hydrea, including bone marrow suppression, the possibility of GI upset, skin ulcers, hair thinning, and teratogenicity. The patient was reminded of the need to seek medical attention of any symptoms of bleeding, anemia, or infection. Continue folic acid 1 mg daily to prevent aplastic bone marrow crises.   2. Left lower quadrant abdominal pain  Long discussion related to abdominal pain and treatment with education provided due to negative imaging and patient's ability to school gradually concern diet will have patient to treat pain with topical agent.  We will continue with omeprazole as directed     Follow Up Instructions: Appointment as scheduled   I discussed the assessment and treatment plan with the patient. The patient was provided an opportunity to ask questions and all were answered. The patient agreed with the plan and demonstrated an understanding of the instructions.   The patient was advised to call back or seek an in-person evaluation if the symptoms worsen or if the condition fails to improve as anticipated.  I provided 15 minutes of telephone- visit time during this encounter.   Barbette Merino, NP

## 2020-07-12 NOTE — Patient Instructions (Signed)
Diverticulitis  Diverticulitis is when small pouches in your colon (large intestine) get infected or swollen. This causes pain in the belly (abdomen) and watery poop (diarrhea). These pouches are called diverticula. The pouches form in people who have acondition called diverticulosis. What are the causes? This condition may be caused by poop (stool) that gets trapped in the pouches in your colon. The poop lets germs (bacteria) grow in the pouches. This causes the infection. What increases the risk? You are more likely to get this condition if you have small pouches in your colon. The risk is higher if: You are overweight or very overweight (obese). You do not exercise enough. You drink alcohol. You smoke or use products with tobacco in them. You eat a diet that has a lot of red meat such as beef, pork, or lamb. You eat a diet that does not have enough fiber in it. You are older than 29 years of age. What are the signs or symptoms? Pain in the belly. Pain is often on the left side, but it may be in other areas. Fever and feeling cold. Feeling like you may vomit. Vomiting. Having cramps. Feeling full. Changes to how often you poop. Blood in your poop. How is this treated? Most cases are treated at home by: Taking over-the-counter pain medicines. Following a clear liquid diet. Taking antibiotic medicines. Resting. Very bad cases may need to be treated at a hospital. This may include: Not eating or drinking. Taking prescription pain medicine. Getting antibiotic medicines through an IV tube. Getting fluid and food through an IV tube. Having surgery. When you are feeling better, your doctor may tell you to have a test to check your colon (colonoscopy). Follow these instructions at home: Medicines Take over-the-counter and prescription medicines only as told by your doctor. These include: Antibiotics. Pain medicines. Fiber pills. Probiotics. Stool softeners. If you were  prescribed an antibiotic medicine, take it as told by your doctor. Do not stop taking the antibiotic even if you start to feel better. Ask your doctor if the medicine prescribed to you requires you to avoid driving or using machinery. Eating and drinking  Follow a diet as told by your doctor. When you feel better, your doctor may tell you to change your diet. You may need to eat a lot of fiber. Fiber makes it easier to poop (have a bowel movement). Foods with fiber include: Berries. Beans. Lentils. Green vegetables. Avoid eating red meat.  General instructions Do not use any products that contain nicotine or tobacco, such as cigarettes, e-cigarettes, and chewing tobacco. If you need help quitting, ask your doctor. Exercise 3 or more times a week. Try to get 30 minutes each time. Exercise enough to sweat and make your heart beat faster. Keep all follow-up visits as told by your doctor. This is important. Contact a doctor if: Your pain does not get better. You are not pooping like normal. Get help right away if: Your pain gets worse. Your symptoms do not get better. Your symptoms get worse very fast. You have a fever. You vomit more than one time. You have poop that is: Bloody. Black. Tarry. Summary This condition happens when small pouches in your colon get infected or swollen. Take medicines only as told by your doctor. Follow a diet as told by your doctor. Keep all follow-up visits as told by your doctor. This is important. This information is not intended to replace advice given to you by your health care provider. Make sure  you discuss any questions you have with your healthcare provider. Document Revised: 10/28/2018 Document Reviewed: 10/28/2018 Elsevier Patient Education  2022 Elsevier Inc.   Abdominal Pain, Adult Pain in the abdomen (abdominal pain) can be caused by many things. Often, abdominal pain is not serious and it gets better with no treatment or by being  treated at home. However, sometimesabdominal pain is serious. Your health care provider will ask questions about your medical history and doa physical exam to try to determine the cause of your abdominal pain. Follow these instructions at home: Medicines Take over-the-counter and prescription medicines only as told by your health care provider. Do not take a laxative unless told by your health care provider. General instructions  Watch your condition for any changes. Drink enough fluid to keep your urine pale yellow. Keep all follow-up visits as told by your health care provider. This is important.  Contact a health care provider if: Your abdominal pain changes or gets worse. You are not hungry or you lose weight without trying. You are constipated or have diarrhea for more than 2-3 days. You have pain when you urinate or have a bowel movement. Your abdominal pain wakes you up at night. Your pain gets worse with meals, after eating, or with certain foods. You are vomiting and cannot keep anything down. You have a fever. You have blood in your urine. Get help right away if: Your pain does not go away as soon as your health care provider told you to expect. You cannot stop vomiting. Your pain is only in areas of the abdomen, such as the right side or the left lower portion of the abdomen. Pain on the right side could be caused by appendicitis. You have bloody or black stools, or stools that look like tar. You have severe pain, cramping, or bloating in your abdomen. You have signs of dehydration, such as: Dark urine, very little urine, or no urine. Cracked lips. Dry mouth. Sunken eyes. Sleepiness. Weakness. You have trouble breathing or chest pain. Summary Often, abdominal pain is not serious and it gets better with no treatment or by being treated at home. However, sometimes abdominal pain is serious. Watch your condition for any changes. Take over-the-counter and prescription  medicines only as told by your health care provider. Contact a health care provider if your abdominal pain changes or gets worse. Get help right away if you have severe pain, cramping, or bloating in your abdomen. This information is not intended to replace advice given to you by your health care provider. Make sure you discuss any questions you have with your healthcare provider. Document Revised: 03/07/2019 Document Reviewed: 05/27/2018 Elsevier Patient Education  2022 [employer].

## 2020-08-04 ENCOUNTER — Ambulatory Visit: Payer: BC Managed Care – PPO | Admitting: Nurse Practitioner

## 2020-08-10 ENCOUNTER — Encounter: Payer: Self-pay | Admitting: Gastroenterology

## 2020-08-10 ENCOUNTER — Ambulatory Visit: Payer: BC Managed Care – PPO | Admitting: Gastroenterology

## 2020-08-10 VITALS — BP 110/60 | HR 70 | Ht 77.5 in | Wt 200.0 lb

## 2020-08-10 DIAGNOSIS — R1012 Left upper quadrant pain: Secondary | ICD-10-CM

## 2020-08-10 NOTE — Patient Instructions (Signed)
If you are age 29 or older, your body mass index should be between 23-30. Your Body mass index is 23.41 kg/m. If this is out of the aforementioned range listed, please consider follow up with your Primary Care Provider.  If you are age 30 or younger, your body mass index should be between 19-25. Your Body mass index is 23.41 kg/m. If this is out of the aformentioned range listed, please consider follow up with your Primary Care Provider.   __________________________________________________________  The [facility] providers would like to encourage you to use [facility] to communicate with providers for non-urgent requests or questions.  Due to long hold times on the telephone, sending your provider a message by [facility] may be a faster and more efficient way to get a response.  Please allow 48 business hours for a response.  Please remember that this is for non-urgent requests.   It was a pleasure to see you today!  Thank you for trusting me with your gastrointestinal care!

## 2020-08-10 NOTE — Progress Notes (Signed)
[facility] Consult Note:  History: Gregory Espinoza 08/10/2020  Referring provider: Barbette Merino, NP  Reason for consult/chief complaint: Abdominal Pain (Left sided pain after meals that has subsided, also reflux with meals which has subsided)   Subjective  HPI:  This is a very pleasant 30 year old man with sickle cell disease referred by primary care for left lower quadrant pain. It came on acutely 1 day in early June when he had been sitting at a child's graduation ceremony for some time, then stood up and said he had a sharp pain in the left upper quadrant that was unrelenting and sent him to urgent care and then the ED.  He had vomited once earlier that morning but not after that.  He had no sick contacts and no associated GI symptoms with this pain such as diarrhea, constipation or rectal bleeding.  The pain continued afterward and might hurt more after he ate but also if he coughed or sneezed he made sudden movements.  It gradually subsided and resolved about a week ago.  Primary care visit note from 07/07/2020 reviewed.  It indicates ED visit on 07/02/2020 for left-sided abdominal pain, negative lab and imaging work-up.  Patient is on Colace and MiraLAX as well as Prilosec for symptoms  ROS:  Review of Systems  Constitutional:  Negative for appetite change and unexpected weight change.  HENT:  Negative for mouth sores and voice change.   Eyes:  Negative for pain and redness.  Respiratory:  Negative for cough and shortness of breath.   Cardiovascular:  Negative for chest pain and palpitations.  Genitourinary:  Negative for dysuria and hematuria.  Musculoskeletal:  Negative for arthralgias and myalgias.  Skin:  Negative for pallor and rash.  Neurological:  Negative for weakness and headaches.  Hematological:  Negative for adenopathy.    Past Medical History: 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HCC)     History of PCR DNA positive for HSV1 10/2019   Sickle cell anemia (HCC)    Thrombocytopenia (HCC) 12/2018   Vitamin D deficiency 12/2018   Last hematology visit 05/12/2020, note was reviewed.  SS sickle cell disease diagnosed November 2019, patient remains on hydroxyurea and last painful crisis May 2021 (which the patient reports was chest pain).  Past Surgical History: Past Surgical History:  Procedure Laterality Date   DENTAL SURGERY       Family History: Family History  Problem Relation Age of Onset   Hypertension Mother    Hypertension Father    Sickle cell anemia Brother     Social History: Social History   Socioeconomic History   Marital status: Single    Spouse name: Not on file   Number of children: Not on file   Years of education: Not on file   Highest education level: Not on file  Occupational History   Not on file  Tobacco Use   Smoking status: Never   Smokeless tobacco: Never  Vaping Use   Vaping Use: Never used  Substance and Sexual Activity   Alcohol use: No    Alcohol/week: 0.0 standard drinks   Drug use: No   Sexual activity: Yes    Birth control/protection: Condom  Other Topics Concern   Not on file  Social History Narrative   Not on file   Social Determinants of Health   Financial Resource Strain: Not on file  Food Insecurity: Not on file  Transportation Needs: Not  on file  Physical Activity: Not on file  Stress: Not on file  Social Connections: Not on file    Allergies: No Known Allergies  Outpatient Meds: Current Outpatient Medications  Medication Sig Dispense Refill   aspirin EC 81 MG tablet Take 1 tablet (81 mg total) by mouth daily. Swallow whole. 30 tablet 11   Camphor-Menthol-Methyl Sal (SALONPAS DEEP RELIEVING) 3.02-08-13 % GEL Apply 1 patch topically every 8 (eight) hours. 78 g 11   folic acid (FOLVITE) 1 MG tablet Take by mouth.     hydroxyurea (HYDREA) 500 MG capsule Take 500 mg by mouth daily. May take with food to  minimize GI side effects.     Lactobacillus-Inulin (CULTURELLE ADULT ULT BALANCE) CAPS Take 1 capsule by mouth daily.     Multiple Vitamin (ONE-A-DAY 55 PLUS PO) Take by mouth. 1 tablet once a day     omeprazole (PRILOSEC) 20 MG capsule Take 1 capsule (20 mg total) by mouth daily. 30 capsule 3   Vitamin D, Ergocalciferol, (DRISDOL) 1.25 MG (50000 UNIT) CAPS capsule Take 1 capsule (50,000 Units total) by mouth every 7 (seven) days. X 12 weeks. 5 capsule 2   No current facility-administered medications for this visit.      ___________________________________________________________________ Objective   Exam:  BP 110/60   Pulse 70   Ht 6' 5.5" (1.969 m)   Wt 200 lb (90.7 kg)   BMI 23.41 kg/m  Wt Readings from Last 3 Encounters:  08/10/20 200 lb (90.7 kg)  07/02/20 200 lb (90.7 kg)  05/03/20 201 lb (91.2 kg)    General: Well-appearing Eyes: sclera anicteric, no redness ENT: oral mucosa moist without lesions, no cervical or supraclavicular lymphadenopathy CV: RRR without murmur, S1/S2, no JVD, no peripheral edema Resp: clear to auscultation bilaterally, normal RR and effort noted GI: soft, mild tenderness light palpation abdominal wall left hemiabdomen tenderness, with active bowel sounds. No guarding or palpable organomegaly noted. Skin; warm and dry, no rash or jaundice noted Neuro: awake, alert and oriented x 3. Normal gross motor function and fluent speech  Labs:  CBC Latest Ref Rng & Units 07/02/2020 05/03/2020 02/03/2020  WBC 4.0 - 10.5 K/uL 6.4 7.0 7.5  Hemoglobin 13.0 - 17.0 g/dL 2.5(D) 6.6(Y) 10.7(L)  Hematocrit 39.0 - 52.0 % 27.0(L) 27.9(L) 30.4(L)  Platelets 150 - 400 K/uL 103(L) 140(L) 419   CMP Latest Ref Rng & Units 07/02/2020 05/03/2020 02/03/2020  Glucose 70 - 99 mg/dL 91 91 81  BUN 6 - 20 mg/dL 7 7 9   Creatinine 0.61 - 1.24 mg/dL 4.03 4.74  Sodium 135 - 145 mmol/L 138 141 141  Potassium 3.5 - 5.1 mmol/L 4.1 4.4 4.7  Chloride 98 - 111 mmol/L 107 105 101  CO2 22  - 32 mmol/L 28 22 22   Calcium 8.9 - 10.3 mg/dL 9.1 9.1 9.6  Total Protein 6.5 - 8.1 g/dL 7.3 7.8 8.0  Total Bilirubin 0.3 - 1.2 mg/dL 4.5(H) 5.7(H) 4.5(H)  Alkaline Phos 38 - 126 U/L 39 48 51  AST 15 - 41 U/L 32 28 27  ALT 0 - 44 U/L 18 13 16      Radiologic Studies:  CLINICAL DATA:  Acute left upper quadrant abdominal pain.   EXAM: CT ABDOMEN AND PELVIS WITH CONTRAST   TECHNIQUE: Multidetector CT imaging of the abdomen and pelvis was performed using the standard protocol following bolus administration of intravenous contrast.   CONTRAST:  74mL OMNIPAQUE IOHEXOL 300 MG/ML  SOLN   COMPARISON:  None.  FINDINGS: Lower chest: No acute abnormality.   Hepatobiliary: Cholelithiasis is noted without biliary dilatation. The liver is unremarkable.   Pancreas: Unremarkable. No pancreatic ductal dilatation or surrounding inflammatory changes.   Spleen: Normal in size without focal abnormality.   Adrenals/Urinary Tract: Adrenal glands are unremarkable. Kidneys are normal, without renal calculi, focal lesion, or hydronephrosis. Bladder is unremarkable.   Stomach/Bowel: Stomach is within normal limits. Appendix appears normal. No evidence of bowel wall thickening, distention, or inflammatory changes.   Vascular/Lymphatic: No significant vascular findings are present. No enlarged abdominal or pelvic lymph nodes.   Reproductive: Prostate is unremarkable.   Other: No abdominal wall hernia or abnormality. No abdominopelvic ascites.   Musculoskeletal: No acute or significant osseous findings.   IMPRESSION: Cholelithiasis.   No other abnormality seen in the abdomen or pelvis.     Electronically Signed   By: Lupita Raider M.D.   On: 07/02/2020 13:30 ________________________________  CLINICAL DATA:  Left upper quadrant pain since yesterday, splenomegaly, thrombocytopenia, history of sickle cell anemia   EXAM: ULTRASOUND ABDOMEN LIMITED   COMPARISON:  None.    FINDINGS: Ultrasound evaluation of the left upper quadrant was performed. The spleen measures 6.2 x 6.7 x 5.8 cm with a volume of 125 cc.   IMPRESSION: Normal size spleen.     Electronically Signed   By: Maudry Mayhew MD   On: 07/02/2020 10:38   Assessment: Encounter Diagnosis  Name Primary?   LUQ pain Yes    Acute onset of pain preceded by a single episode of vomiting, no other GI symptoms.  No subsequent GI symptoms, pain also exacerbated by certain movements favoring musculoskeletal.?  Atypical sickle crisis Incidental gallstones seen.  Elevated bilirubin chronic from sickle cell hemolysis.  He does not appear to have an underlying acute or chronic digestive condition, and I do not think he needs further testing at this point.   I recommended stopping the probiotic and Prilosec.  He has not been taking the MiraLAX as originally prescribed because he did not feel he needed it.  Plan:  I will be glad to see him back as needed, especially if chronic digestive symptoms should develop and certainly if there is any rectal bleeding.  Thank you for the courtesy of this consult.  Please call me with any questions or concerns.  Charlie Pitter III  CC: Referring provider noted above

## 2020-08-11 DIAGNOSIS — E559 Vitamin D deficiency, unspecified: Secondary | ICD-10-CM | POA: Diagnosis not present

## 2020-08-11 DIAGNOSIS — D571 Sickle-cell disease without crisis: Secondary | ICD-10-CM | POA: Diagnosis not present

## 2020-08-11 DIAGNOSIS — D5701 Hb-SS disease with acute chest syndrome: Secondary | ICD-10-CM | POA: Diagnosis not present

## 2020-09-02 ENCOUNTER — Encounter (HOSPITAL_COMMUNITY): Payer: Self-pay

## 2020-09-02 ENCOUNTER — Emergency Department (HOSPITAL_COMMUNITY)
Admission: EM | Admit: 2020-09-02 | Discharge: 2020-09-02 | Disposition: A | Discharge status: Home / Self Care | Payer: BC Managed Care – PPO | Attending: Emergency Medicine | Admitting: Emergency Medicine

## 2020-09-02 ENCOUNTER — Other Ambulatory Visit: Payer: Self-pay

## 2020-09-02 DIAGNOSIS — Z5321 Procedure and treatment not carried out due to patient leaving prior to being seen by health care provider: Secondary | ICD-10-CM | POA: Insufficient documentation

## 2020-09-02 DIAGNOSIS — R109 Unspecified abdominal pain: Secondary | ICD-10-CM | POA: Diagnosis not present

## 2020-09-02 DIAGNOSIS — K5900 Constipation, unspecified: Secondary | ICD-10-CM | POA: Insufficient documentation

## 2020-09-02 NOTE — ED Triage Notes (Signed)
Pt complains of abdominal pain x 2 days. Pt reports not having a bowel movement in 2 days.

## 2020-09-03 ENCOUNTER — Encounter (HOSPITAL_COMMUNITY): Payer: Self-pay | Admitting: Emergency Medicine

## 2020-09-03 ENCOUNTER — Emergency Department (HOSPITAL_COMMUNITY)
Admission: EM | Admit: 2020-09-03 | Discharge: 2020-09-04 | Disposition: A | Discharge status: Home / Self Care | Payer: BC Managed Care – PPO | Attending: Emergency Medicine | Admitting: Emergency Medicine

## 2020-09-03 ENCOUNTER — Other Ambulatory Visit: Payer: Self-pay

## 2020-09-03 DIAGNOSIS — R0789 Other chest pain: Secondary | ICD-10-CM | POA: Diagnosis not present

## 2020-09-03 DIAGNOSIS — R509 Fever, unspecified: Secondary | ICD-10-CM | POA: Diagnosis not present

## 2020-09-03 DIAGNOSIS — Z7982 Long term (current) use of aspirin: Secondary | ICD-10-CM | POA: Diagnosis not present

## 2020-09-03 DIAGNOSIS — U071 COVID-19: Secondary | ICD-10-CM

## 2020-09-03 DIAGNOSIS — R079 Chest pain, unspecified: Secondary | ICD-10-CM | POA: Diagnosis not present

## 2020-09-03 NOTE — ED Triage Notes (Addendum)
Pt c/o left sided chest pain and fever that started today. Also c/o dizziness that began just before arriving to ED. Hx of sickle cell, but states he doesn't think sx are related to [facility]. Pt attempted to self medicate with tylenol and nyquil, but had no relief.

## 2020-09-04 ENCOUNTER — Emergency Department (HOSPITAL_COMMUNITY): Payer: BC Managed Care – PPO

## 2020-09-04 DIAGNOSIS — R079 Chest pain, unspecified: Secondary | ICD-10-CM | POA: Diagnosis not present

## 2020-09-04 LAB — CBC WITH DIFFERENTIAL/PLATELET
Abs Immature Granulocytes: 0.04 10*3/uL (ref 0.00–0.07)
Basophils Absolute: 0.1 10*3/uL (ref 0.0–0.1)
Basophils Relative: 1 %
Eosinophils Absolute: 0.1 10*3/uL (ref 0.0–0.5)
Eosinophils Relative: 1 %
HCT: 31.3 % — ABNORMAL LOW (ref 39.0–52.0)
Hemoglobin: 11.1 g/dL — ABNORMAL LOW (ref 13.0–17.0)
Immature Granulocytes: 1 %
Lymphocytes Relative: 10 %
Lymphs Abs: 0.8 10*3/uL (ref 0.7–4.0)
MCH: 37.5 pg — ABNORMAL HIGH (ref 26.0–34.0)
MCHC: 35.5 g/dL (ref 30.0–36.0)
MCV: 105.7 fL — ABNORMAL HIGH (ref 80.0–100.0)
Monocytes Absolute: 0.6 10*3/uL (ref 0.1–1.0)
Monocytes Relative: 8 %
Neutro Abs: 6.4 10*3/uL (ref 1.7–7.7)
Neutrophils Relative %: 79 %
Platelets: 557 10*3/uL — ABNORMAL HIGH (ref 150–400)
RBC: 2.96 MIL/uL — ABNORMAL LOW (ref 4.22–5.81)
RDW: 17.6 % — ABNORMAL HIGH (ref 11.5–15.5)
WBC: 8 10*3/uL (ref 4.0–10.5)
nRBC: 1.6 % — ABNORMAL HIGH (ref 0.0–0.2)

## 2020-09-04 LAB — COMPREHENSIVE METABOLIC PANEL
ALT: 19 U/L (ref 0–44)
AST: 37 U/L (ref 15–41)
Albumin: 5 g/dL (ref 3.5–5.0)
Alkaline Phosphatase: 53 U/L (ref 38–126)
Anion gap: 12 (ref 5–15)
BUN: <5 mg/dL — ABNORMAL LOW (ref 6–20)
CO2: 23 mmol/L (ref 22–32)
Calcium: 9.5 mg/dL (ref 8.9–10.3)
Chloride: 100 mmol/L (ref 98–111)
Creatinine, Ser: 1.04 mg/dL (ref 0.61–1.24)
GFR, Estimated: >60 mL/min (ref >60)
Glucose, Bld: 90 mg/dL (ref 70–99)
Potassium: 4.4 mmol/L (ref 3.5–5.1)
Sodium: 135 mmol/L (ref 135–145)
Total Bilirubin: 5.6 mg/dL — ABNORMAL HIGH (ref 0.3–1.2)
Total Protein: 9.6 g/dL — ABNORMAL HIGH (ref 6.5–8.1)

## 2020-09-04 LAB — RESP PANEL BY RT-PCR (FLU A&B, COVID) ARPGX2
Influenza A by PCR: NEGATIVE
Influenza B by PCR: NEGATIVE
SARS Coronavirus 2 by RT PCR: POSITIVE — AB

## 2020-09-04 LAB — RETICULOCYTES
Immature Retic Fract: 29.5 % — ABNORMAL HIGH (ref 2.3–15.9)
RBC.: 2.96 MIL/uL — ABNORMAL LOW (ref 4.22–5.81)
Retic Count, Absolute: 259 10*3/uL — ABNORMAL HIGH (ref 19.0–186.0)
Retic Ct Pct: 8.4 % — ABNORMAL HIGH (ref 0.4–3.1)

## 2020-09-04 LAB — TROPONIN I (HIGH SENSITIVITY): Troponin I (High Sensitivity): 4 ng/L (ref <18)

## 2020-09-04 LAB — LACTIC ACID, PLASMA: Lactic Acid, Venous: 1.6 mmol/L (ref 0.5–1.9)

## 2020-09-04 MED ORDER — IBUPROFEN 800 MG PO TABS
800.0000 mg | ORAL_TABLET | Freq: Once | ORAL | Status: AC
  Administered 2020-09-04: 800 mg via ORAL
  Filled 2020-09-04: qty 1

## 2020-09-04 MED ORDER — MOLNUPIRAVIR EUA 200MG CAPSULE: 4.0000 | ORAL_CAPSULE | Freq: Two times a day (BID) | ORAL | 0 refills | Status: AC

## 2020-09-04 MED ORDER — BENZONATATE 100 MG PO CAPS: 100.0000 mg | ORAL_CAPSULE | Freq: Three times a day (TID) | ORAL | 0 refills | Status: DC

## 2020-09-04 NOTE — Discharge Instructions (Addendum)
  You have tested positive for COVID-19, meaning that you were infected with the novel coronavirus and could give the virus to others.  It is vitally important that you stay home so you do not spread it to others.      Please continue isolation at home, for at least 10 days since the start of your symptoms and until you have had 24 hours with no fever (without taking a fever reducer) and with improving of symptoms.  If you have no symptoms but tested positive (or all symptoms resolve after 5 days and you have no fever) you can leave your house but continue to wear a mask around others for an additional 5 days. If you have a fever,continue to stay home until you have had 24 hours of no fever. Most cases improve 5-10 days from onset but we have seen a small number of patients who have gotten worse after the 10 days.  Please be sure to watch for worsening symptoms and remain taking the proper precautions.   Go to the nearest hospital ED for assessment if fever/cough/breathlessness are severe or illness seems like a threat to life.    The following symptoms may appear 2-14 days after exposure: Fever Cough Shortness of breath or difficulty breathing Chills Repeated shaking with chills Muscle pain Headache Sore throat New loss of taste or smell Fatigue Congestion or runny nose Nausea or vomiting Diarrhea   You may also take acetaminophen (Tylenol) as needed for fever.  HOME CARE: Only take medications as instructed by your medical team. Drink plenty of fluids and get plenty of rest. A steam or ultrasonic humidifier can help if you have congestion.   GET HELP RIGHT AWAY IF YOU HAVE EMERGENCY WARNING SIGNS.  Call 911 or proceed to your closest emergency facility if: You develop worsening high fever. Trouble breathing Bluish lips or face Persistent pain or pressure in the chest New confusion Inability to wake or stay awake You cough up blood. Your symptoms become more severe Inability  to hold down food or fluids  This list is not all possible symptoms. Contact your medical provider for any symptoms that are severe or concerning to you.      

## 2020-09-04 NOTE — ED Provider Notes (Signed)
Emergency Medicine Provider Triage Evaluation Note  Gregory Espinoza , a 30 y.o. male  was evaluated in triage.  Pt complains of fever and chest pain.  Reports fevers to 102-103.  States he had a negative home covid test.  Has felt a little dizzy.  Has tried OTC cough and cold meds.  Reports having had some diarrhea, but reports this is improved.  Review of Systems  Positive: Fever, chest pain Negative: Vomiting, dysuria  Physical Exam  BP 133/72 (BP Location: Right Arm)   Pulse 96   Temp (!) 100.6 F (38.1 C) (Oral) Comment: Pt took 1500 mg tylenol around 1900 tonight  Resp 16   Ht 6' 5.5" (1.969 m)   Wt 90.7 kg   SpO2 98%   BMI 23.41 kg/m  Gen:   Awake, no distress   Resp:  Normal effort  MSK:   Moves extremities without difficulty  Other:    Medical Decision Making  Medically screening exam initiated at 12:05 AM.  Appropriate orders placed.  Gregory Espinoza was informed that the remainder of the evaluation will be completed by another provider, this initial triage assessment does not replace that evaluation, and the importance of remaining in the ED until their evaluation is complete.  CP, fever.     Roxy Horseman, PA-C 09/04/20 0007    Jacalyn Lefevre, MD 09/04/20 [name]

## 2020-09-04 NOTE — ED Notes (Signed)
Heat pack provided for the patient.

## 2020-09-04 NOTE — ED Provider Notes (Signed)
[facility]-EMERGENCY DEPT Provider Note   CSN: [id number] Arrival date & time: 09/03/20  2326     History Chief Complaint  Patient presents with   Fever   Chest Pain    Gregory Espinoza is a 30 y.o. male.  Pt complains of fever and chest pain.  Reports fevers to 102-103.  States he had a negative home covid test.  Has felt a little dizzy.  Has tried OTC cough and cold meds.  Reports having had some diarrhea, but reports this is improved.  He states that this feels unlike his sickle cell pain.  Reports taking 1500 mg of Tylenol at 7 PM last night.  The history is provided by the patient. No language interpreter was used.      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   Sickle cell anemia (HCC)    Thrombocytopenia (HCC) 12/2018   Vitamin D deficiency 12/2018    Patient Active Problem List   Diagnosis Date Noted   Abdominal pain, right lower quadrant 12/19/2018   Absolute anemia 12/13/2018   Hb-SS disease with acute chest syndrome (HCC) 10/24/2018   Transaminitis 03/13/2018   Sickle cell disease, type SS (HCC) 04/24/2014    Past Surgical History:  Procedure Laterality Date   DENTAL SURGERY         Family History  Problem Relation Age of Onset   Hypertension Mother    Hypertension Father    Sickle cell anemia Brother     Social History   Tobacco Use   Smoking status: Never   Smokeless tobacco: Never  Vaping Use   Vaping Use: Never used  Substance Use Topics   Alcohol use: No    Alcohol/week: 0.0 standard drinks   Drug use: No    Home Medications Prior to Admission medications   Medication Sig Start Date End Date Taking? Authorizing Provider  aspirin EC 81 MG tablet Take 1 tablet (81 mg total) by mouth daily. Swallow whole. 02/03/20   Kallie Locks, FNP  Camphor-Menthol-Methyl Sal [facility] DEEP RELIEVING) 3.02-08-13 % GEL Apply 1  patch topically every 8 (eight) hours. 07/12/20   Barbette Merino, NP  folic acid (FOLVITE) 1 MG tablet Take by mouth. 12/05/17   [provider]  hydroxyurea (HYDREA) 500 MG capsule Take 500 mg by mouth daily. May take with food to minimize GI side effects.    [provider]  Lactobacillus-Inulin (CULTURELLE ADULT ULT BALANCE) CAPS Take 1 capsule by mouth daily.    [provider]  Multiple Vitamin (ONE-A-DAY 55 PLUS PO) Take by mouth. 1 tablet once a day    [provider]  omeprazole (PRILOSEC) 20 MG capsule Take 1 capsule (20 mg total) by mouth daily. 07/07/20   Ivonne Andrew, NP  Vitamin D, Ergocalciferol, (DRISDOL) 1.25 MG (50000 UNIT) CAPS capsule Take 1 capsule (50,000 Units total) by mouth every 7 (seven) days. X 12 weeks. 02/06/20   Kallie Locks, FNP    Allergies    Prilosec [omeprazole]  Review of Systems   Review of Systems  All other systems reviewed and are negative.  Physical Exam Updated Vital Signs BP 128/67 (BP Location: Right Arm)   Pulse (!) 102   Temp (!) 102.6 F (39.2 C) (Oral)   Resp (!) 28   Ht 6' 5.5" (1.969 m)   Wt 90.7 kg  SpO2 100%   BMI 23.41 kg/m   Physical Exam Vitals and nursing note reviewed.  Constitutional:      Appearance: He is well-developed.  HENT:     Head: Normocephalic and atraumatic.  Eyes:     Conjunctiva/sclera: Conjunctivae normal.  Cardiovascular:     Rate and Rhythm: Normal rate and regular rhythm.     Heart sounds: No murmur heard. Pulmonary:     Effort: Pulmonary effort is normal. No respiratory distress.     Breath sounds: Normal breath sounds.  Abdominal:     Palpations: Abdomen is soft.     Tenderness: There is no abdominal tenderness.  Musculoskeletal:     Cervical back: Neck supple.  Skin:    General: Skin is warm and dry.  Neurological:     Mental Status: He is alert and oriented to person, place, and time.  Psychiatric:        Mood and Affect: Mood normal.         Behavior: Behavior normal.    ED Results / Procedures / Treatments   Labs (all labs ordered are listed, but only abnormal results are displayed) Labs Reviewed  RETICULOCYTES - Abnormal; Notable for the following components:      Result Value   Retic Ct Pct 8.4 (*)    RBC. 2.96 (*)    Retic Count, Absolute 259.0 (*)    Immature Retic Fract 29.5 (*)    All other components within normal limits  CBC WITH DIFFERENTIAL/PLATELET - Abnormal; Notable for the following components:   RBC 2.96 (*)    Hemoglobin 11.1 (*)    HCT 31.3 (*)    MCV 105.7 (*)    MCH 37.5 (*)    RDW 17.6 (*)    Platelets 557 (*)    nRBC 1.6 (*)    All other components within normal limits  COMPREHENSIVE METABOLIC PANEL - Abnormal; Notable for the following components:   BUN <5 (*)    Total Protein 9.6 (*)    Total Bilirubin 5.6 (*)    All other components within normal limits  RESP PANEL BY RT-PCR (FLU A&B, COVID) ARPGX2  CULTURE, BLOOD (ROUTINE X 2)  CULTURE, BLOOD (ROUTINE X 2)  LACTIC ACID, PLASMA  LACTIC ACID, PLASMA  TROPONIN I (HIGH SENSITIVITY)  TROPONIN I (HIGH SENSITIVITY)    EKG EKG Interpretation  Date/Time:  Friday September 03 2020 23:47:13 EDT Ventricular Rate:  99 PR Interval:  186 QRS Duration: 86 QT Interval:  322 QTC Calculation: 413 R Axis:   78 Text Interpretation: Normal sinus rhythm Normal ECG No significant change since last tracing Confirmed by Jacalyn Lefevre [phone]) on 09/03/2020 11:51:58 PM  Radiology DG Chest 2 View  Result Date: 09/04/2020 CLINICAL DATA:  Chest pain EXAM: CHEST - 2 VIEW COMPARISON:  07/27/2018 FINDINGS: Heart and mediastinal contours are within normal limits. No focal opacities or effusions. No acute bony abnormality. IMPRESSION: No active cardiopulmonary disease. Electronically Signed   By: Charlett Nose M.D.   On: 09/04/2020 00:11    Procedures Procedures   Medications Ordered in ED Medications - No data to display  ED Course  I have reviewed the  triage vital signs and the nursing notes.  Pertinent labs & imaging results that were available during my care of the patient were reviewed by me and considered in my medical decision making (see chart for details).    MDM Rules/Calculators/A&P  Kaseem Morello was evaluated in Emergency Department on 09/04/2020 for the symptoms described in the history of present illness. He was evaluated in the context of the global COVID-19 pandemic, which necessitated consideration that the patient might be at risk for infection with the SARS-CoV-2 virus that causes COVID-19. Institutional protocols and algorithms that pertain to the evaluation of patients at risk for COVID-19 are in a state of rapid change based on information released by regulatory bodies including the CDC and federal and state organizations. These policies and algorithms were followed during the patient's care in the ED.  Patient here with fever and chest pain.  Symptoms started yesterday.  Reports taking a home COVID test which was negative.  States that this does not feel like his sickle cell disease.  Chest x-ray shows no infiltrate.  Blood work is about baseline for patient.  COVID test is positive.  Will treat with molnupiravir due to drug interactions with paxlovid.   DC to home with outpatient follow-up.  Return precautions discussed. Final Clinical Impression(s) / ED Diagnoses Final diagnoses:  COVID-19    Rx / DC Orders ED Discharge Orders          Ordered    molnupiravir EUA 200 mg CAPS  2 times daily        09/04/20 0355    benzonatate (TESSALON) 100 MG capsule  Every 8 hours        09/04/20 0355             Roxy Horseman, PA-C 09/04/20 [id number]    Jacalyn Lefevre, MD 09/04/20 [phone]

## 2020-09-09 LAB — CULTURE, BLOOD (ROUTINE X 2)
Culture: NO GROWTH
Special Requests: ADEQUATE

## 2020-09-16 ENCOUNTER — Other Ambulatory Visit: Payer: Self-pay

## 2020-09-16 ENCOUNTER — Ambulatory Visit
Admission: EM | Admit: 2020-09-16 | Discharge: 2020-09-16 | Disposition: A | Discharge status: Home / Self Care | Payer: BC Managed Care – PPO

## 2020-09-16 ENCOUNTER — Encounter: Payer: Self-pay | Admitting: Emergency Medicine

## 2020-09-16 ENCOUNTER — Ambulatory Visit: Payer: BC Managed Care – PPO

## 2020-09-16 DIAGNOSIS — Z113 Encounter for screening for infections with a predominantly sexual mode of transmission: Secondary | ICD-10-CM

## 2020-09-16 NOTE — ED Provider Notes (Signed)
Patient presenting for STD screening after new partners/condom breaking from intercourse yesterday.  Discussed with patient timing of appropriate STD screening after intercourse.  Discussed screening may be too early today given intercourse within the past 24 hours.  Recommended follow-up and in approximately 72 hours for more accurate screening.   Lew Dawes, [place] 09/16/20 1317

## 2020-09-16 NOTE — ED Triage Notes (Signed)
Barrier of protection broke during sex yesterday, just wants to be tested for STDs.

## 2020-09-29 ENCOUNTER — Ambulatory Visit: Payer: BC Managed Care – PPO | Admitting: Nurse Practitioner

## 2020-10-03 ENCOUNTER — Other Ambulatory Visit: Payer: Self-pay

## 2020-10-03 ENCOUNTER — Emergency Department (HOSPITAL_COMMUNITY)
Admission: EM | Admit: 2020-10-03 | Discharge: 2020-10-04 | Disposition: A | Discharge status: Home / Self Care | Payer: BC Managed Care – PPO | Attending: Emergency Medicine | Admitting: Emergency Medicine

## 2020-10-03 ENCOUNTER — Encounter (HOSPITAL_COMMUNITY): Payer: Self-pay

## 2020-10-03 DIAGNOSIS — Z7982 Long term (current) use of aspirin: Secondary | ICD-10-CM | POA: Insufficient documentation

## 2020-10-03 DIAGNOSIS — Z2914 Encounter for prophylactic rabies immune globin: Secondary | ICD-10-CM | POA: Diagnosis not present

## 2020-10-03 DIAGNOSIS — Z203 Contact with and (suspected) exposure to rabies: Secondary | ICD-10-CM | POA: Diagnosis not present

## 2020-10-03 DIAGNOSIS — Z23 Encounter for immunization: Secondary | ICD-10-CM | POA: Insufficient documentation

## 2020-10-03 NOTE — ED Triage Notes (Signed)
Pt states that he killed a bat in his home a couple of days ago and then noticed a scrape on the top of his foot. Pt thinks that he may have been exposed to rabies. Pt does have 2 marks on the top of his right foot. Pt states that he has not been feeling well since then.

## 2020-10-04 ENCOUNTER — Other Ambulatory Visit: Payer: Self-pay

## 2020-10-04 DIAGNOSIS — Z203 Contact with and (suspected) exposure to rabies: Secondary | ICD-10-CM | POA: Diagnosis not present

## 2020-10-04 DIAGNOSIS — Z2914 Encounter for prophylactic rabies immune globin: Secondary | ICD-10-CM | POA: Diagnosis not present

## 2020-10-04 MED ORDER — RABIES VACCINE, PCEC IM SUSR
1.0000 mL | Freq: Once | INTRAMUSCULAR | Status: AC
  Administered 2020-10-04: 1 mL via INTRAMUSCULAR
  Filled 2020-10-04: qty 1

## 2020-10-04 MED ORDER — RABIES IMMUNE GLOBULIN 150 UNIT/ML IM INJ
20.0000 [IU]/kg | INJECTION | Freq: Once | INTRAMUSCULAR | Status: AC
  Administered 2020-10-04: 1800 [IU] via INTRAMUSCULAR
  Filled 2020-10-04: qty 20

## 2020-10-04 NOTE — Discharge Instructions (Addendum)
Return per schedule for additional rabies vaccination series. You can go to Short Stay at [facility] for these injections. It is important to receive the entire series.   Return to the ED as needed.   You will need to obtain further injections on Day 3 (9/8), Day 7 (9/12) and Day 14 (9/19).

## 2020-10-04 NOTE — ED Notes (Signed)
Pt discharged. Instructions given. AAOX4. Pt in no apparent distress or pain. The opportunity to ask questions was provided.  

## 2020-10-04 NOTE — ED Provider Notes (Signed)
[facility]-EMERGENCY DEPT Provider Note   CSN: [id number] Arrival date & time: 10/03/20  2202     History Chief Complaint  Patient presents with   rabies exposure    Gregory Espinoza is a 30 y.o. male.  Patient with history of sickle cell disease presents with concern for rabies exposure. He states he killed a bat in his home 4 days ago and noticed he had an abrasion to the right foot tonight. No swelling or pain associated with the abrasion. No fever, nausea, vomiting, headache.   The history is provided by the patient. No language interpreter was used.      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   Sickle cell anemia (HCC)    Thrombocytopenia (HCC) 12/2018   Vitamin D deficiency 12/2018    Patient Active Problem List   Diagnosis Date Noted   Abdominal pain, right lower quadrant 12/19/2018   Absolute anemia 12/13/2018   Hb-SS disease with acute chest syndrome (HCC) 10/24/2018   Transaminitis 03/13/2018   Sickle cell disease, type SS (HCC) 04/24/2014    Past Surgical History:  Procedure Laterality Date   DENTAL SURGERY         Family History  Problem Relation Age of Onset   Hypertension Mother    Hypertension Father    Sickle cell anemia Brother     Social History   Tobacco Use   Smoking status: Never   Smokeless tobacco: Never  Vaping Use   Vaping Use: Never used  Substance Use Topics   Alcohol use: No    Alcohol/week: 0.0 standard drinks   Drug use: No    Home Medications Prior to Admission medications   Medication Sig Start Date End Date Taking? Authorizing Provider  acetaminophen (TYLENOL) 325 MG tablet Take 650 mg by mouth every 6 (six) hours as needed for moderate pain or headache.    [provider]  aspirin EC 81 MG tablet Take 1 tablet (81 mg total) by mouth daily. Swallow whole. Patient not taking: No sig  reported 02/03/20   Kallie Locks, FNP  benzonatate (TESSALON) 100 MG capsule Take 1 capsule (100 mg total) by mouth every 8 (eight) hours. 09/04/20   Roxy Horseman, PA-C  Camphor-Menthol-Methyl Sal [facility] DEEP RELIEVING) 3.02-08-13 % GEL Apply 1 patch topically every 8 (eight) hours. Patient not taking: No sig reported 07/12/20   Barbette Merino, NP  folic acid (FOLVITE) 1 MG tablet Take by mouth. 12/05/17   [provider]  hydroxyurea (HYDREA) 500 MG capsule Take 500 mg by mouth daily. May take with food to minimize GI side effects.    [provider]  Multiple Vitamin (ONE-A-DAY 55 PLUS PO) Take by mouth. 1 tablet once a day    [provider]  omeprazole (PRILOSEC) 20 MG capsule Take 1 capsule (20 mg total) by mouth daily. Patient not taking: No sig reported 07/07/20   Ivonne Andrew, NP  Vitamin D, Ergocalciferol, (DRISDOL) 1.25 MG (50000 UNIT) CAPS capsule Take 1 capsule (50,000 Units total) by mouth every 7 (seven) days. X 12 weeks. Patient taking differently: Take 50,000 Units by mouth every 7 (seven) days. sunday 02/06/20   Kallie Locks, FNP  voxelotor (OXBRYTA) 500 MG TABS tablet Take 1,500 mg by mouth daily. 08/12/20   [provider]    Allergies    Prilosec [omeprazole]  Review of Systems   Review of Systems  Constitutional:  Negative for chills and fever.  HENT: Negative.    Respiratory: Negative.    Cardiovascular: Negative.   Gastrointestinal: Negative.   Musculoskeletal: Negative.   Skin: Negative.        Minor abrasion right foot  Neurological: Negative.    Physical Exam Updated Vital Signs BP (!) 113/59   Pulse 60   Temp 98.2 F (36.8 C) (Oral)   Resp 18   Ht 6\' 5"  (1.956 m)   Wt 90.7 kg   SpO2 100%   BMI 23.72 kg/m   Physical Exam Vitals and nursing note reviewed.  Constitutional:      Appearance: He is well-developed.  Pulmonary:     Effort: Pulmonary effort is normal.  Musculoskeletal:        General:  Normal range of motion.     Cervical back: Normal range of motion.     Comments: Right foot: there are two circular marks without skin breakdown on distal dorsal foot. No redness or swelling.  Skin:    General: Skin is warm and dry.     Findings: No erythema.  Neurological:     Mental Status: He is alert and oriented to person, place, and time.    ED Results / Procedures / Treatments   Labs (all labs ordered are listed, but only abnormal results are displayed) Labs Reviewed - No data to display  EKG None  Radiology No results found.  Procedures Procedures   Medications Ordered in ED Medications  rabies immune globulin (HYPERAB/KEDRAB) injection 1,800 Units (has no administration in time range)  rabies vaccine (RABAVERT) injection 1 mL (has no administration in time range)    ED Course  I have reviewed the triage vital signs and the nursing notes.  Pertinent labs & imaging results that were available during my care of the patient were reviewed by me and considered in my medical decision making (see chart for details).    MDM Rules/Calculators/A&P                           Patient exposed to a bat in his home 4 days ago, here tonight out of concern for ?bite to right foot and exposure to rabies.  Findings on the right foot are not c/w bite from small animal such as a bat. However, directed exposure with a bat in the home is indication of rabies prophylaxis. Discussed need for dosing adjustment given history of sickle cell (Hb-SS) and there is no adjustment required.   Rabies IgG and vaccine ordered.   Final Clinical Impression(s) / ED Diagnoses Final diagnoses:  None   Rabies exposure  Rx / DC Orders ED Discharge Orders     None        , PA-C 10/04/20 0409    12/04/20, MD 10/04/20 [phone]

## 2020-10-07 ENCOUNTER — Ambulatory Visit (HOSPITAL_COMMUNITY)
Admission: EM | Admit: 2020-10-07 | Discharge: 2020-10-07 | Disposition: A | Discharge status: Home / Self Care | Payer: BC Managed Care – PPO | Attending: Internal Medicine | Admitting: Internal Medicine

## 2020-10-07 ENCOUNTER — Encounter (HOSPITAL_COMMUNITY): Payer: Self-pay | Admitting: Emergency Medicine

## 2020-10-07 ENCOUNTER — Other Ambulatory Visit: Payer: Self-pay

## 2020-10-07 MED ORDER — RABIES VACCINE, PCEC IM SUSR
1.0000 mL | Freq: Once | INTRAMUSCULAR | Status: AC
  Administered 2020-10-07: 1 mL via INTRAMUSCULAR

## 2020-10-07 MED ORDER — RABIES VACCINE, PCEC IM SUSR
INTRAMUSCULAR | Status: AC
  Filled 2020-10-07: qty 1

## 2020-10-07 NOTE — ED Triage Notes (Signed)
Here for rabies injection, denies needs to see a provider.

## 2020-10-08 ENCOUNTER — Ambulatory Visit: Payer: BC Managed Care – PPO | Admitting: Nurse Practitioner

## 2020-10-11 ENCOUNTER — Ambulatory Visit
Admission: RE | Admit: 2020-10-11 | Discharge: 2020-10-11 | Disposition: A | Discharge status: Home / Self Care | Payer: BC Managed Care – PPO | Source: Ambulatory Visit | Attending: Internal Medicine | Admitting: Internal Medicine

## 2020-10-11 ENCOUNTER — Other Ambulatory Visit: Payer: Self-pay

## 2020-10-11 DIAGNOSIS — Z203 Contact with and (suspected) exposure to rabies: Secondary | ICD-10-CM

## 2020-10-11 MED ORDER — RABIES VACCINE, PCEC IM SUSR
1.0000 mL | Freq: Once | INTRAMUSCULAR | Status: AC
  Administered 2020-10-11: 1 mL via INTRAMUSCULAR

## 2020-10-11 NOTE — ED Notes (Signed)
Pt states that he will be out of town before his next vaccine is due and wanted to know if he should wait to receive the vaccine early or wait until the following Wednesday, per Maple Mirza NP patient should wait until he comes back to receive the remaining vaccine

## 2020-10-11 NOTE — ED Triage Notes (Signed)
Pt is present for 3rd rabies vaccine  

## 2020-10-22 ENCOUNTER — Other Ambulatory Visit: Payer: Self-pay

## 2020-10-22 ENCOUNTER — Ambulatory Visit
Admission: EM | Admit: 2020-10-22 | Discharge: 2020-10-22 | Disposition: A | Discharge status: Home / Self Care | Payer: BC Managed Care – PPO | Attending: Family Medicine | Admitting: Family Medicine

## 2020-10-22 DIAGNOSIS — Z23 Encounter for immunization: Secondary | ICD-10-CM | POA: Diagnosis not present

## 2020-10-22 DIAGNOSIS — Z203 Contact with and (suspected) exposure to rabies: Secondary | ICD-10-CM

## 2020-10-22 MED ORDER — RABIES VACCINE, PCEC IM SUSR
1.0000 mL | Freq: Once | INTRAMUSCULAR | Status: AC
  Administered 2020-10-22: 1 mL via INTRAMUSCULAR

## 2020-10-22 NOTE — ED Notes (Signed)
Spoke with povider to make aware of patient being late on this last dose. Patient made aware of what to watch for at home and when to come back for further evaluation.

## 2020-10-22 NOTE — Discharge Instructions (Signed)
Patient seen today for 4th (last) Rabies vaccination.   Please monitor for any signs that may indicate rabies (severe headache, fatigue, muscle spasms, hallucinations, and change in appetite. If you notice any of these changes please go to ED to be evaluated.

## 2020-10-22 NOTE — ED Triage Notes (Signed)
Pt presents today for his last rabies vaccination, patient denies having symptoms when getting the vaccination.

## 2020-11-10 DIAGNOSIS — Z79899 Other long term (current) drug therapy: Secondary | ICD-10-CM | POA: Diagnosis not present

## 2020-11-10 DIAGNOSIS — Z23 Encounter for immunization: Secondary | ICD-10-CM | POA: Diagnosis not present

## 2020-11-10 DIAGNOSIS — E559 Vitamin D deficiency, unspecified: Secondary | ICD-10-CM | POA: Diagnosis not present

## 2020-11-10 DIAGNOSIS — D571 Sickle-cell disease without crisis: Secondary | ICD-10-CM | POA: Diagnosis not present

## 2020-11-17 ENCOUNTER — Other Ambulatory Visit: Payer: Self-pay

## 2020-11-17 ENCOUNTER — Encounter: Payer: Self-pay | Admitting: Nurse Practitioner

## 2020-11-17 ENCOUNTER — Ambulatory Visit: Payer: BC Managed Care – PPO | Admitting: Nurse Practitioner

## 2020-11-17 VITALS — BP 120/61 | HR 84 | Temp 98.4°F | Ht 77.0 in | Wt 201.2 lb

## 2020-11-17 DIAGNOSIS — Z09 Encounter for follow-up examination after completed treatment for conditions other than malignant neoplasm: Secondary | ICD-10-CM | POA: Diagnosis not present

## 2020-11-17 DIAGNOSIS — Z113 Encounter for screening for infections with a predominantly sexual mode of transmission: Secondary | ICD-10-CM | POA: Diagnosis not present

## 2020-11-17 DIAGNOSIS — Z114 Encounter for screening for human immunodeficiency virus [HIV]: Secondary | ICD-10-CM | POA: Diagnosis not present

## 2020-11-17 DIAGNOSIS — R1084 Generalized abdominal pain: Secondary | ICD-10-CM

## 2020-11-17 DIAGNOSIS — D571 Sickle-cell disease without crisis: Secondary | ICD-10-CM

## 2020-11-17 DIAGNOSIS — Z1159 Encounter for screening for other viral diseases: Secondary | ICD-10-CM | POA: Diagnosis not present

## 2020-11-17 LAB — POCT URINALYSIS DIP (CLINITEK)
Bilirubin, UA: NEGATIVE
Blood, UA: NEGATIVE
Glucose, UA: NEGATIVE mg/dL
Ketones, POC UA: NEGATIVE mg/dL
Leukocytes, UA: NEGATIVE
Nitrite, UA: NEGATIVE
POC PROTEIN,UA: NEGATIVE
Spec Grav, UA: 1.015 (ref 1.010–1.025)
Urobilinogen, UA: 2 E.U./dL — AB
pH, UA: 5.5 (ref 5.0–8.0)

## 2020-11-17 NOTE — Patient Instructions (Signed)
Health Maintenance, Male Adopting a healthy lifestyle and getting preventive care are important in promoting health and wellness. Ask your health care provider about: The right schedule for you to have regular tests and exams. Things you can do on your own to prevent diseases and keep yourself healthy. What should I know about diet, weight, and exercise? Eat a healthy diet  Eat a diet that includes plenty of vegetables, fruits, low-fat dairy products, and lean protein. Do not eat a lot of foods that are high in solid fats, added sugars, or sodium. Maintain a healthy weight Body mass index (BMI) is a measurement that can be used to identify possible weight problems. It estimates body fat based on height and weight. Your health care provider can help determine your BMI and help you achieve or maintain a healthy weight. Get regular exercise Get regular exercise. This is one of the most important things you can do for your health. Most adults should: Exercise for at least 150 minutes each week. The exercise should increase your heart rate and make you sweat (moderate-intensity exercise). Do strengthening exercises at least twice a week. This is in addition to the moderate-intensity exercise. Spend less time sitting. Even light physical activity can be beneficial. Watch cholesterol and blood lipids Have your blood tested for lipids and cholesterol at 29 years of age, then have this test every 5 years. You may need to have your cholesterol levels checked more often if: Your lipid or cholesterol levels are high. You are older than 29 years of age. You are at high risk for heart disease. What should I know about cancer screening? Many types of cancers can be detected early and may often be prevented. Depending on your health history and family history, you may need to have cancer screening at various ages. This may include screening for: Colorectal cancer. Prostate cancer. Skin cancer. Lung  cancer. What should I know about heart disease, diabetes, and high blood pressure? Blood pressure and heart disease High blood pressure causes heart disease and increases the risk of stroke. This is more likely to develop in people who have high blood pressure readings, are of African descent, or are overweight. Talk with your health care provider about your target blood pressure readings. Have your blood pressure checked: Every 3-5 years if you are 18-39 years of age. Every year if you are 40 years old or older. If you are between the ages of 65 and 75 and are a current or former smoker, ask your health care provider if you should have a one-time screening for abdominal aortic aneurysm (AAA). Diabetes Have regular diabetes screenings. This checks your fasting blood sugar level. Have the screening done: Once every three years after age 45 if you are at a normal weight and have a low risk for diabetes. More often and at a younger age if you are overweight or have a high risk for diabetes. What should I know about preventing infection? Hepatitis B If you have a higher risk for hepatitis B, you should be screened for this virus. Talk with your health care provider to find out if you are at risk for hepatitis B infection. Hepatitis C Blood testing is recommended for: Everyone born from 1945 through 1965. Anyone with known risk factors for hepatitis C. Sexually transmitted infections (STIs) You should be screened each year for STIs, including gonorrhea and chlamydia, if: You are sexually active and are younger than 29 years of age. You are older than 29 years   of age and your health care provider tells you that you are at risk for this type of infection. Your sexual activity has changed since you were last screened, and you are at increased risk for chlamydia or gonorrhea. Ask your health care provider if you are at risk. Ask your health care provider about whether you are at high risk for HIV.  Your health care provider may recommend a prescription medicine to help prevent HIV infection. If you choose to take medicine to prevent HIV, you should first get tested for HIV. You should then be tested every 3 months for as long as you are taking the medicine. Follow these instructions at home: Lifestyle Do not use any products that contain nicotine or tobacco, such as cigarettes, e-cigarettes, and chewing tobacco. If you need help quitting, ask your health care provider. Do not use street drugs. Do not share needles. Ask your health care provider for help if you need support or information about quitting drugs. Alcohol use Do not drink alcohol if your health care provider tells you not to drink. If you drink alcohol: Limit how much you have to 0-2 drinks a day. Be aware of how much alcohol is in your drink. In the [place] one drink equals one 12 oz bottle of beer (355 mL), one 5 oz glass of wine (148 mL), or one 1 oz glass of hard liquor (44 mL). General instructions Schedule regular health, dental, and eye exams. Stay current with your vaccines. Tell your health care provider if: You often feel depressed. You have ever been abused or do not feel safe at home. Summary Adopting a healthy lifestyle and getting preventive care are important in promoting health and wellness. Follow your health care provider's instructions about healthy diet, exercising, and getting tested or screened for diseases. Follow your health care provider's instructions on monitoring your cholesterol and blood pressure. This information is not intended to replace advice given to you by your health care provider. Make sure you discuss any questions you have with your health care provider. Document Revised: 03/26/2020 Document Reviewed: 01/09/2018 Elsevier Patient Education  2022 Elsevier Inc.  

## 2020-11-17 NOTE — Progress Notes (Signed)
[place], [place]  [address] Phone:  [phone]   Fax:  [phone]   Established Patient Office Visit  Subjective:  Patient ID: Gregory Espinoza, male    DOB: Apr 05, 1990  Age: 30 y.o. MRN: [id number]  CC:  Chief Complaint  Patient presents with   Follow-up    Follow up;Sickle cell anemia    HPI Nizar Olshefski presents for follow up.  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   He is following up. He is followed by hematologist at [facility] for his SCD. He has some shortness of breath that resolves with out treatment. He does have sternal chest pain. He use Epson salt bath and icy hot. This provides some relief. He has abdominal pain with eating certain foods. He feels likethis has been going on for a while. He his making a correlation with foods. Denies fever, headache, cough, wheezing, abdominal pain, back pain, hip pain, or leg pain. Denies any open wounds, skin irritation. Last eye exam was  2022.   He works full time as a Landscape architect for [employer]. He is dating. He would like to be screened for STD's . 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[place])    History of PCR DNA positive for HSV1 10/2019   Sickle cell anemia (H[facility])    Thrombocytopenia ([place]) 12/2018   Vitamin D deficiency 12/2018    Past Surgical History:  Procedure Laterality Date   DENTAL SURGERY      Family History  Problem Relation Age of Onset   Hypertension Mother    Hypertension Father    Sickle cell anemia Brother     Social History   Socioeconomic History   Marital status: Single    Spouse name: Not on  file   Number of children: Not on file   Years of education: Not on file   Highest education level: Not on file  Occupational History   Not on file  Tobacco Use   Smoking status: Never   Smokeless tobacco: Never  Vaping Use   Vaping Use: Never used  Substance and Sexual Activity   Alcohol use: No    Alcohol/week: 0.0 standard drinks   Drug use: No   Sexual activity: Yes    Birth control/protection: Condom  Other Topics Concern   Not on file  Social History Narrative   Not on file   Social Determinants of Health   Financial Resource Strain: Not on file  Food Insecurity: Not on file  Transportation Needs: Not on file  Physical Activity: Not on file  Stress: Not on file  Social Connections: Not on file  Intimate Partner Violence: Not on file    Outpatient Medications Prior to Visit  Medication Sig Dispense Refill   acetaminophen (TYLENOL) 325 MG tablet Take 650 mg by mouth every 6 (six) hours as needed for moderate pain or headache.     aspirin EC 81 MG tablet Take 1 tablet (81 mg total) by mouth daily. Swallow whole. 30 tablet 11   hydroxyurea (HYDREA) 500 MG capsule Take 500 mg by mouth daily. May take with food to minimize GI side effects.  Multiple Vitamin (ONE-A-DAY 55 PLUS PO) Take by mouth. 1 tablet once a day     Vitamin D, Ergocalciferol, (DRISDOL) 1.25 MG (50000 UNIT) CAPS capsule Take 1 capsule (50,000 Units total) by mouth every 7 (seven) days. X 12 weeks. (Patient taking differently: Take 50,000 Units by mouth every 7 (seven) days. sunday) 5 capsule 2   benzonatate (TESSALON) 100 MG capsule Take 1 capsule (100 mg total) by mouth every 8 (eight) hours. (Patient not taking: Reported on 11/17/2020) 21 capsule 0   Camphor-Menthol-Methyl Sal (SALONPAS DEEP RELIEVING) 3.02-08-13 % GEL Apply 1 patch topically every 8 (eight) hours. (Patient not taking: No sig reported) 78 g 11   folic acid (FOLVITE) 1 MG tablet Take by mouth. (Patient not taking: Reported on 11/17/2020)      omeprazole (PRILOSEC) 20 MG capsule Take 1 capsule (20 mg total) by mouth daily. (Patient not taking: No sig reported) 30 capsule 3   voxelotor (OXBRYTA) 500 MG TABS tablet Take 1,500 mg by mouth daily. (Patient not taking: Reported on 11/17/2020)     No facility-administered medications prior to visit.    Allergies  Allergen Reactions   Prilosec [Omeprazole] Other (See Comments)    Pt states he can't breath too well    ROS Review of Systems    Objective:    Physical Exam HENT:     Head: Normocephalic and atraumatic.     Nose: Nose normal.     Mouth/Throat:     Mouth: Mucous membranes are moist.  Cardiovascular:     Rate and Rhythm: Normal rate.     Pulses: Normal pulses.     Heart sounds: Normal heart sounds.  Pulmonary:     Effort: Pulmonary effort is normal.     Breath sounds: Normal breath sounds.  Abdominal:     General: Abdomen is flat. Bowel sounds are normal.     Palpations: Abdomen is soft.  Musculoskeletal:        General: Normal range of motion.     Cervical back: Normal range of motion.  Skin:    General: Skin is warm and dry.     Capillary Refill: Capillary refill takes less than 2 seconds.  Neurological:     General: No focal deficit present.     Mental Status: He is alert and oriented to person, place, and time.  Psychiatric:        Mood and Affect: Mood normal.        Behavior: Behavior normal.        Thought Content: Thought content normal.        Judgment: Judgment normal.    BP 120/61   Pulse 84   Temp 98.4 F (36.9 C)   Ht _0  (1.956 m)   Wt 201 lb 3.2 oz (91.3 kg)   SpO2 98%   BMI 23.86 kg/m  Wt Readings from Last 3 Encounters:  11/17/20 201 lb 3.2 oz (91.3 kg)  10/03/20 200 lb (90.7 kg)  09/16/20 203 lb (92.1 kg)     Health Maintenance Due  Topic Date Due   TETANUS/TDAP  Never done    There are no preventive care reminders to display for this patient.  Lab Results  Component Value Date   TSH 4.339 08/16/2011    Lab Results  Component Value Date   WBC 8.0 09/04/2020   HGB 11.1 (L) 09/04/2020   HCT 31.3 (L) 09/04/2020   MCV 105.7 (H) 09/04/2020   PLT 557 (H) 09/04/2020  Lab Results  Component Value Date   NA 135 09/04/2020   K 4.4 09/04/2020   CO2 23 09/04/2020   GLUCOSE 90 09/04/2020   BUN <5 (L) 09/04/2020   CREATININE 1.04 09/04/2020   BILITOT 5.6 (H) 09/04/2020   ALKPHOS 53 09/04/2020   AST 37 09/04/2020   ALT 19 09/04/2020   PROT 9.6 (H) 09/04/2020   ALBUMIN 5.0 09/04/2020   CALCIUM 9.5 09/04/2020   ANIONGAP 12 09/04/2020   EGFR 125 05/03/2020   Lab Results  Component Value Date   CHOL 87 08/16/2011   Lab Results  Component Value Date   HDL 31 (L) 08/16/2011   Lab Results  Component Value Date   LDLCALC 39 08/16/2011   Lab Results  Component Value Date   TRIG 85 08/16/2011   Lab Results  Component Value Date   CHOLHDL 2.8 08/16/2011   No results found for: HGBA1C    Assessment & Plan:   Problem List Items Addressed This Visit       Other   Sickle cell disease, type SS ([place]) Stable Continue with great self care Follow up with [facility] as scheduled We discussed the need for good hydration, monitoring of hydration status, avoidance of heat, cold, stress, and infection triggers. We discussed the risks and benefits of Hydrea, including bone marrow suppression, the possibility of GI upset, skin ulcers, hair thinning, and teratogenicity. The patient was reminded of the need to seek medical attention of any symptoms of bleeding, anemia, or infection. Continue folic acid 1 mg daily to prevent aplastic bone marrow crises.    Relevant Orders   POCT URINALYSIS DIP (CLINITEK) (Completed)   Other Visit Diagnoses     Follow-up examination    -  Primary Discussed male health maintenance;  and annual exams.  Discussed general safety in vehicle and COVID Discussed regular hydration with water Discussed healthy diet and exercise and weight management Discussed sexual  health  Discussed mental health Encouraged to call our office for an appointment with in ongoing concerns for questions.      Screening for STD (sexually transmitted disease)       Relevant Orders   Hepatitis C antibody (Completed)   HIV Antibody (routine testing w rflx) (Completed)   Chlamydia/Gonococcus/Trichomonas, NAA (Completed)   STD Screen (8)   Generalized abdominal pain     Discussed gluten allergy and what this could mean Further evaluation with labs   Relevant Orders   Gluten Sensitivity Screen (Completed)   Encounter for hepatitis C screening test for low risk patient       Relevant Orders   Hepatitis C antibody (Completed)   Screening for HIV (human immunodeficiency virus)       Relevant Orders   HIV Antibody (routine testing w rflx) (Completed)       No orders of the defined types were placed in this encounter.   Follow-up: Return in about 3 months (around 02/17/2021) for Follow up SCD [address].    Vevelyn Francois, NP

## 2020-11-18 LAB — GLUTEN SENSITIVITY SCREEN: tTG/DGP Screen: POSITIVE — AB

## 2020-11-18 LAB — NOTE:

## 2020-11-18 LAB — HIV ANTIBODY (ROUTINE TESTING W REFLEX): HIV Screen 4th Generation wRfx: NONREACTIVE

## 2020-11-18 LAB — HEPATITIS C ANTIBODY: Hep C Virus Ab: 0.1 s/co ratio (ref 0.0–0.9)

## 2020-11-19 ENCOUNTER — Other Ambulatory Visit: Payer: Self-pay | Admitting: Nurse Practitioner

## 2020-11-19 DIAGNOSIS — K9041 Non-celiac gluten sensitivity: Secondary | ICD-10-CM

## 2020-11-19 NOTE — Progress Notes (Signed)
   [facility] [address] [place], [place]  [address] Phone:  [phone]   Fax:  [phone] 

## 2020-11-20 LAB — CHLAMYDIA/GONOCOCCUS/TRICHOMONAS, NAA
Chlamydia by NAA: NEGATIVE
Gonococcus by NAA: NEGATIVE
Trich vag by NAA: NEGATIVE

## 2021-01-14 DIAGNOSIS — F4323 Adjustment disorder with mixed anxiety and depressed mood: Secondary | ICD-10-CM | POA: Diagnosis not present

## 2021-01-22 ENCOUNTER — Encounter (HOSPITAL_COMMUNITY): Payer: Self-pay

## 2021-01-22 ENCOUNTER — Other Ambulatory Visit: Payer: Self-pay

## 2021-01-22 ENCOUNTER — Emergency Department (HOSPITAL_COMMUNITY)
Admission: EM | Admit: 2021-01-22 | Discharge: 2021-01-22 | Disposition: A | Discharge status: Home / Self Care | Payer: BC Managed Care – PPO | Attending: Emergency Medicine | Admitting: Emergency Medicine

## 2021-01-22 DIAGNOSIS — R1110 Vomiting, unspecified: Secondary | ICD-10-CM | POA: Diagnosis not present

## 2021-01-22 DIAGNOSIS — Z79899 Other long term (current) drug therapy: Secondary | ICD-10-CM | POA: Diagnosis not present

## 2021-01-22 DIAGNOSIS — T40711A Poisoning by cannabis, accidental (unintentional), initial encounter: Secondary | ICD-10-CM | POA: Insufficient documentation

## 2021-01-22 DIAGNOSIS — F410 Panic disorder [episodic paroxysmal anxiety] without agoraphobia: Secondary | ICD-10-CM | POA: Diagnosis not present

## 2021-01-22 DIAGNOSIS — R9431 Abnormal electrocardiogram [ECG] [EKG]: Secondary | ICD-10-CM | POA: Diagnosis not present

## 2021-01-22 DIAGNOSIS — F19122 Other psychoactive substance abuse with intoxication with perceptual disturbances: Secondary | ICD-10-CM | POA: Diagnosis not present

## 2021-01-22 DIAGNOSIS — Z7982 Long term (current) use of aspirin: Secondary | ICD-10-CM | POA: Diagnosis not present

## 2021-01-22 DIAGNOSIS — R443 Hallucinations, unspecified: Secondary | ICD-10-CM | POA: Insufficient documentation

## 2021-01-22 DIAGNOSIS — F19922 Other psychoactive substance use, unspecified with intoxication with perceptual disturbance: Secondary | ICD-10-CM

## 2021-01-22 NOTE — ED Triage Notes (Signed)
Pt took 2 Delta 8 gummies. (250 or 350 mg each). Pt is walking around leaving his room.

## 2021-01-22 NOTE — Discharge Instructions (Signed)
Do not use any more delta 8 or CBD Gummies.  Get rechecked if you have any new or concerning symptoms.

## 2021-01-22 NOTE — ED Provider Notes (Signed)
[facility]-EMERGENCY DEPT Provider Note   CSN: [id number] Arrival date & time: 01/22/21  0235     History Chief Complaint  Patient presents with   Marijuana Use    Gregory Espinoza is a 30 y.o. male.  The history is provided by the patient, a significant other and medical records.  Gregory Espinoza is a 30 y.o. male who presents to the Emergency Department complaining of double overdose.  He presents to the emergency department accompanied by his girlfriend for evaluation following accidental overdose on delta 8 Gummies.  At 1030 last night he took a gummy 150 mg D8.  He felt like nothing was happening so he took a second different gummy around 1230.  Around 1 AM he started to hallucinate and developed rapid heart rate and felt like he was having a panic attack and he was brought in for evaluation.  He did have numerous episodes of emesis prior to ED arrival.  At time of ED presentation he does report feeling improved but still feels different.  The Gummies were purchased from the store in [place].  He does have a history of sickle cell anemia.  No additional medical problems.    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   Sickle cell anemia (HCC)    Thrombocytopenia ([facility]) 12/2018   Vitamin D deficiency 12/2018    Patient Active Problem List   Diagnosis Date Noted   Abdominal pain, right lower quadrant 12/19/2018   Absolute anemia 12/13/2018   Hb-SS disease with acute chest syndrome (HCC) 10/24/2018   Transaminitis 03/13/2018   Sickle cell disease, type SS (HCC) 04/24/2014    Past Surgical History:  Procedure Laterality Date   DENTAL SURGERY         Family History  Problem Relation Age of Onset   Hypertension Mother    Hypertension Father    Sickle cell anemia Brother     Social History   Tobacco Use   Smoking status: Never   Smokeless  tobacco: Never  Vaping Use   Vaping Use: Never used  Substance Use Topics   Alcohol use: No    Alcohol/week: 0.0 standard drinks   Drug use: No    Home Medications Prior to Admission medications   Medication Sig Start Date End Date Taking? Authorizing Provider  acetaminophen (TYLENOL) 325 MG tablet Take 650 mg by mouth every 6 (six) hours as needed for moderate pain or headache.    [provider]  aspirin EC 81 MG tablet Take 1 tablet (81 mg total) by mouth daily. Swallow whole. 02/03/20   Kallie Locks, FNP  benzonatate (TESSALON) 100 MG capsule Take 1 capsule (100 mg total) by mouth every 8 (eight) hours. Patient not taking: Reported on 11/17/2020 09/04/20   Roxy Horseman, PA-C  Camphor-Menthol-Methyl Sal [facility] DEEP RELIEVING) 3.02-08-13 % GEL Apply 1 patch topically every 8 (eight) hours. Patient not taking: No sig reported 07/12/20   Barbette Merino, NP  folic acid (FOLVITE) 1 MG tablet Take by mouth. Patient not taking: Reported on 11/17/2020 12/05/17   [provider]  hydroxyurea (HYDREA) 500 MG capsule Take 500 mg by mouth daily. May take with food to minimize GI side effects.    [provider]  Multiple Vitamin (ONE-A-DAY 55 PLUS PO) Take by mouth. 1 tablet once a day    [provider]  Vitamin D, Ergocalciferol, (DRISDOL) 1.25 MG (50000 UNIT) CAPS capsule Take 1 capsule (50,000 Units total) by mouth every 7 (seven) days. X 12 weeks. Patient taking differently: Take 50,000 Units by mouth every 7 (seven) days. sunday 02/06/20   Kallie Locks, FNP    Allergies    Prilosec [omeprazole]  Review of Systems   Review of Systems  All other systems reviewed and are negative.  Physical Exam Updated Vital Signs BP (!) 141/72    Pulse (!) 110    Temp 98.5 F (36.9 C) (Oral)    Resp 18    Ht 6\' 5"  (1.956 m)    Wt 95.3 kg    SpO2 96%    BMI 24.90 kg/m   Physical Exam Vitals and nursing note reviewed.  Constitutional:      Appearance:  He is well-developed.  HENT:     Head: Normocephalic and atraumatic.  Cardiovascular:     Rate and Rhythm: Normal rate and regular rhythm.     Heart sounds: No murmur heard. Pulmonary:     Effort: Pulmonary effort is normal. No respiratory distress.     Breath sounds: Normal breath sounds.  Abdominal:     Palpations: Abdomen is soft.     Tenderness: There is no abdominal tenderness. There is no guarding or rebound.  Musculoskeletal:        General: No tenderness.  Skin:    General: Skin is warm and dry.  Neurological:     Mental Status: He is alert and oriented to person, place, and time.  Psychiatric:     Comments: Mildly anxious.  Very apologetic.    ED Results / Procedures / Treatments   Labs (all labs ordered are listed, but only abnormal results are displayed) Labs Reviewed - No data to display  EKG None  Radiology No results found.  Procedures Procedures   Medications Ordered in ED Medications - No data to display  ED Course  I have reviewed the triage vital signs and the nursing notes.  Pertinent labs & imaging results that were available during my care of the patient were reviewed by me and considered in my medical decision making (see chart for details).    MDM Rules/Calculators/A&P                         Patient here for evaluation following ingestion of delta 8 Gummies.  Patient anxious on examination, does not appear to be actively hallucinating.  EKG is without acute ischemic changes.  Plan to observe in the emergency department.  Patient care transferred pending reassessment.    Final Clinical Impression(s) / ED Diagnoses Final diagnoses:  None    Rx / DC Orders ED Discharge Orders     None        , MD 01/22/21 [phone]
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
## 2021-01-22 NOTE — ED Provider Notes (Signed)
Patient signed to me by Dr. Madilyn Hook and he is now clinically sober and will be discharged   Gregory Nick, MD 01/22/21 [phone]

## 2021-01-22 NOTE — ED Notes (Signed)
Pt ambulatory to bathroom with steady gait.

## 2021-02-09 DIAGNOSIS — E559 Vitamin D deficiency, unspecified: Secondary | ICD-10-CM | POA: Diagnosis not present

## 2021-02-09 DIAGNOSIS — D5701 Hb-SS disease with acute chest syndrome: Secondary | ICD-10-CM | POA: Diagnosis not present

## 2021-02-09 DIAGNOSIS — D571 Sickle-cell disease without crisis: Secondary | ICD-10-CM | POA: Diagnosis not present

## 2021-02-09 DIAGNOSIS — Z9189 Other specified personal risk factors, not elsewhere classified: Secondary | ICD-10-CM | POA: Diagnosis not present

## 2021-02-11 DIAGNOSIS — F411 Generalized anxiety disorder: Secondary | ICD-10-CM | POA: Diagnosis not present

## 2021-02-17 ENCOUNTER — Other Ambulatory Visit: Payer: Self-pay

## 2021-02-17 ENCOUNTER — Encounter: Payer: Self-pay | Admitting: Nurse Practitioner

## 2021-02-17 ENCOUNTER — Ambulatory Visit: Payer: BC Managed Care – PPO | Admitting: Nurse Practitioner

## 2021-02-17 VITALS — BP 127/58 | HR 72 | Temp 98.6°F | Ht 77.0 in | Wt 205.4 lb

## 2021-02-17 DIAGNOSIS — D571 Sickle-cell disease without crisis: Secondary | ICD-10-CM | POA: Diagnosis not present

## 2021-02-17 LAB — POCT URINALYSIS DIP (CLINITEK)
Bilirubin, UA: NEGATIVE
Blood, UA: NEGATIVE
Glucose, UA: NEGATIVE mg/dL
Ketones, POC UA: NEGATIVE mg/dL
Leukocytes, UA: NEGATIVE
Nitrite, UA: NEGATIVE
POC PROTEIN,UA: NEGATIVE
Spec Grav, UA: 1.015 (ref 1.010–1.025)
Urobilinogen, UA: 4 E.U./dL — AB
pH, UA: 7 (ref 5.0–8.0)

## 2021-02-17 NOTE — Patient Instructions (Signed)
Sickle Cell Anemia, Adult ?Sickle cell anemia is a condition where your red blood cells are shaped like sickles. Red blood cells carry oxygen through the body. Sickle-shaped cells do not live as long as normal red blood cells. They also clump together and block blood from flowing through the blood vessels. This prevents the body from getting enough oxygen. Sickle cell anemia causes organ damage and pain. It also increases the risk of infection. ?Follow these instructions at home: ?Medicines ?Take over-the-counter and prescription medicines only as told by your doctor. ?If you were prescribed an antibiotic medicine, take it as told by your doctor. Do not stop taking the antibiotic even if you start to feel better. ?If you develop a fever, do not take medicines to lower the fever right away. Tell your doctor about the fever. ?Managing pain, stiffness, and swelling ?Try these methods to help with pain: ?Use a heating pad. ?Take a warm bath. ?Distract yourself, such as by watching TV. ?Eating and drinking ?Drink enough fluid to keep your pee (urine) clear or pale yellow. Drink more in hot weather and during exercise. ?Limit or avoid alcohol. ?Eat a healthy diet. Eat plenty of fruits, vegetables, whole grains, and lean protein. ?Take vitamins and supplements as told by your doctor. ?Traveling ?When traveling, keep these with you: ?Your medical information. ?The names of your doctors. ?Your medicines. ?If you need to take an airplane, talk to your doctor first. ?Activity ?Rest often. ?Avoid exercises that make your heart beat much faster, such as jogging. ?General instructions ?Do not use products that have nicotine or tobacco, such as cigarettes and e-cigarettes. If you need help quitting, ask your doctor. ?Consider wearing a medical alert bracelet. ?Avoid being in high places (high altitudes), such as mountains. ?Avoid very hot or cold temperatures. ?Avoid places where the temperature changes a lot. ?Keep all follow-up  visits as told by your doctor. This is important. ?Contact a doctor if: ?A joint hurts. ?Your feet or hands hurt or swell. ?You feel tired (fatigued). ?Get help right away if: ?You have symptoms of infection. These include: ?Fever. ?Chills. ?Being very tired. ?Irritability. ?Poor eating. ?Throwing up (vomiting). ?You feel dizzy or faint. ?You have new stomach pain, especially on the left side. ?You have a an erection (priapism) that lasts more than 4 hours. ?You have numbness in your arms or legs. ?You have a hard time moving your arms or legs. ?You have trouble talking. ?You have pain that does not go away when you take medicine. ?You are short of breath. ?You are breathing fast. ?You have a long-term cough. ?You have pain in your chest. ?You have a bad headache. ?You have a stiff neck. ?Your stomach looks bloated even though you did not eat much. ?Your skin is pale. ?You suddenly cannot see well. ?Summary ?Sickle cell anemia is a condition where your red blood cells are shaped like sickles. ?Follow your doctor's advice on ways to manage pain, food to eat, activities to do, and steps to take for safe travel. ?Get medical help right away if you have any signs of infection, such as a fever. ?This information is not intended to replace advice given to you by your health care provider. Make sure you discuss any questions you have with your health care provider. ?Document Revised: 06/12/2019 Document Reviewed: 06/12/2019 ?Elsevier Patient Education ? 2022 [employer] Inc[employer] ? ?

## 2021-02-17 NOTE — Progress Notes (Signed)
Gregory Espinoza, [place]  [address] Phone:  [phone]   Fax:  [phone]   Established Patient Office Visit  Subjective:  Patient ID: Gregory Espinoza, male    DOB: 29-Jun-1990  Age: 31 y.o. MRN: [id number]  CC:  Chief Complaint  Patient presents with   Follow-up    Pt is here today for his 3 month follow up visit with no concerns or issues.    HPI Gregory Espinoza presents for follow up. 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   He is following up for his SCD. He is followed by hematology. He was seen on 02/09/21 by [facility]. His labs were completed H&H 9.6 & 28.1 Retic 4.9 % CMP normal range; Bilirubin 7.0.LDH 305 iron 233, UIBC 92 and % saturation 72.  He continues on his folic acid 1 mg , vitamin D 50,000 units, hydroxyurea 500 mg. He is compliant with his medication.  Denies fever, headache, cough, wheezing, shortness of breath, chest pains, abdominal pain, back pain, hip pain, or leg pain. Denies any open wounds, skin irritation.  He denies any activity intolerance.  Last eye exam was in the last year; upcoming apt.   He reports that day before Christmas Eve. He had eaten gummies with Delta 8 without his knowledge.Marland Kitchen  He did go to the emergency department for evaluation.  He denies any problems.  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[place])    History of PCR DNA positive for HSV1 10/2019   Sickle cell anemia (H[facility])    Thrombocytopenia ([place]) 12/2018   Vitamin D deficiency 12/2018    Past Surgical History:  Procedure Laterality Date   DENTAL SURGERY      Family History  Problem Relation Age of Onset   Hypertension Mother    Hypertension Father    Sickle cell anemia Brother      Social History   Socioeconomic History   Marital status: Single    Spouse name: Not on file   Number of children: Not on file   Years of education: Not on file   Highest education level: Not on file  Occupational History   Not on file  Tobacco Use   Smoking status: Never   Smokeless tobacco: Never  Vaping Use   Vaping Use: Never used  Substance and Sexual Activity   Alcohol use: No    Alcohol/week: 0.0 standard drinks   Drug use: No   Sexual activity: Yes    Birth control/protection: Condom  Other Topics Concern   Not on file  Social History Narrative   Not on file   Social Determinants of Health   Financial Resource Strain: Not on file  Food Insecurity: Not on file  Transportation Needs: Not on file  Physical Activity: Not on file  Stress: Not on file  Social Connections: Not on file  Intimate Partner Violence: Not on file    Outpatient Medications Prior to Visit  Medication Sig Dispense Refill   folic acid (FOLVITE) 1 MG tablet Take 1 mg by mouth daily.     hydroxyurea (HYDREA) 500 MG capsule Take 500 mg by mouth daily. May take with food to minimize GI side effects.     Multiple Vitamin (ONE-A-DAY 55 PLUS PO) Take  1 tablet by mouth daily.     Vitamin D, Ergocalciferol, (DRISDOL) 1.25 MG (50000 UNIT) CAPS capsule Take 1 capsule (50,000 Units total) by mouth every 7 (seven) days. X 12 weeks. (Patient taking differently: Take 50,000 Units by mouth every 7 (seven) days. sunday) 5 capsule 2   aspirin EC 81 MG tablet Take 1 tablet (81 mg total) by mouth daily. Swallow whole. (Patient not taking: Reported on 01/22/2021) 30 tablet 11   benzonatate (TESSALON) 100 MG capsule Take 1 capsule (100 mg total) by mouth every 8 (eight) hours. (Patient not taking: Reported on 11/17/2020) 21 capsule 0   Camphor-Menthol-Methyl Sal (SALONPAS DEEP RELIEVING) 3.02-08-13 % GEL Apply 1 patch topically every 8 (eight) hours. (Patient not taking: Reported on 09/04/2020) 78 g 11   No  facility-administered medications prior to visit.    Allergies  Allergen Reactions   Prilosec [Omeprazole] Other (See Comments)    Pt states he can't breath too well    ROS Review of Systems    Objective:    Physical Exam HENT:     Head: Normocephalic and atraumatic.     Nose: Nose normal.     Mouth/Throat:     Mouth: Mucous membranes are moist.  Cardiovascular:     Rate and Rhythm: Normal rate and regular rhythm.     Pulses: Normal pulses.     Heart sounds: Normal heart sounds.  Pulmonary:     Effort: Pulmonary effort is normal.     Breath sounds: Normal breath sounds.  Abdominal:     Palpations: Abdomen is soft.  Musculoskeletal:        General: Normal range of motion.     Cervical back: Normal range of motion.     Right lower leg: No edema.     Left lower leg: No edema.  Skin:    General: Skin is warm and dry.     Capillary Refill: Capillary refill takes less than 2 seconds.     Comments: Acne spot to forehead  Neurological:     General: No focal deficit present.     Mental Status: He is alert and oriented to person, place, and time.  Psychiatric:        Mood and Affect: Mood normal.        Behavior: Behavior normal.        Thought Content: Thought content normal.        Judgment: Judgment normal.    BP (!) 127/58    Pulse 72    Temp 98.6 F (37 C)    Ht $R'6\' 5"'fM$  (1.956 m)    Wt 205 lb 6.4 oz (93.2 kg)    SpO2 98%    BMI 24.36 kg/m  Wt Readings from Last 3 Encounters:  02/17/21 205 lb 6.4 oz (93.2 kg)  01/22/21 210 lb (95.3 kg)  11/17/20 201 lb 3.2 oz (91.3 kg)     There are no preventive care reminders to display for this patient.   There are no preventive care reminders to display for this patient.  Lab Results  Component Value Date   TSH 4.339 08/16/2011   Lab Results  Component Value Date   WBC 8.0 09/04/2020   HGB 11.1 (L) 09/04/2020   HCT 31.3 (L) 09/04/2020   MCV 105.7 (H) 09/04/2020   PLT 557 (H) 09/04/2020   Lab Results  Component  Value Date   NA 135 09/04/2020   K 4.4 09/04/2020   CO2 23 09/04/2020   GLUCOSE  90 09/04/2020   BUN <5 (L) 09/04/2020   CREATININE 1.04 09/04/2020   BILITOT 5.6 (H) 09/04/2020   ALKPHOS 53 09/04/2020   AST 37 09/04/2020   ALT 19 09/04/2020   PROT 9.6 (H) 09/04/2020   ALBUMIN 5.0 09/04/2020   CALCIUM 9.5 09/04/2020   ANIONGAP 12 09/04/2020   EGFR 125 05/03/2020   Lab Results  Component Value Date   CHOL 87 08/16/2011   Lab Results  Component Value Date   HDL 31 (L) 08/16/2011   Lab Results  Component Value Date   LDLCALC 39 08/16/2011   Lab Results  Component Value Date   TRIG 85 08/16/2011   Lab Results  Component Value Date   CHOLHDL 2.8 08/16/2011   No results found for: HGBA1C    Assessment & Plan:   Problem List Items Addressed This Visit       Other   Sickle cell disease, type SS ([place]) - Primary Ensure adequate hydration. Move frequently to reduce venous thromboembolism risk. Avoid situations that could lead to dehydration or could exacerbate pain Discussed S&S of infection, seizures, stroke acute chest, DVT and how important it is to seek medical attention Take medication as directed along with pain contract and overall compliance Discussed the risk related to opiate use (addition, tolerance and dependency)    Relevant Orders   POCT URINALYSIS DIP (CLINITEK)    No orders of the defined types were placed in this encounter.   Follow-up: Return in about 6 months (around 08/17/2021) for Follow up SCD 295[address].    Vevelyn Francois, NP

## 2021-03-11 DIAGNOSIS — F411 Generalized anxiety disorder: Secondary | ICD-10-CM | POA: Diagnosis not present

## 2021-03-25 DIAGNOSIS — F411 Generalized anxiety disorder: Secondary | ICD-10-CM | POA: Diagnosis not present

## 2021-04-22 DIAGNOSIS — F411 Generalized anxiety disorder: Secondary | ICD-10-CM | POA: Diagnosis not present

## 2021-04-25 ENCOUNTER — Encounter (HOSPITAL_COMMUNITY): Payer: Self-pay | Admitting: Emergency Medicine

## 2021-04-25 ENCOUNTER — Emergency Department (HOSPITAL_COMMUNITY): Payer: BC Managed Care – PPO

## 2021-04-25 ENCOUNTER — Ambulatory Visit: Payer: BC Managed Care – PPO | Admitting: Nurse Practitioner

## 2021-04-25 ENCOUNTER — Encounter: Payer: Self-pay | Admitting: Nurse Practitioner

## 2021-04-25 ENCOUNTER — Other Ambulatory Visit: Payer: Self-pay

## 2021-04-25 ENCOUNTER — Emergency Department (HOSPITAL_COMMUNITY)
Admission: EM | Admit: 2021-04-25 | Discharge: 2021-04-25 | Disposition: A | Discharge status: Home / Self Care | Payer: BC Managed Care – PPO | Attending: Student | Admitting: Student

## 2021-04-25 VITALS — BP 123/65 | HR 69 | Temp 97.6°F | Ht 77.0 in | Wt 207.0 lb

## 2021-04-25 DIAGNOSIS — R519 Headache, unspecified: Secondary | ICD-10-CM | POA: Insufficient documentation

## 2021-04-25 DIAGNOSIS — G459 Transient cerebral ischemic attack, unspecified: Secondary | ICD-10-CM | POA: Diagnosis not present

## 2021-04-25 DIAGNOSIS — R202 Paresthesia of skin: Secondary | ICD-10-CM | POA: Insufficient documentation

## 2021-04-25 DIAGNOSIS — R0789 Other chest pain: Secondary | ICD-10-CM | POA: Diagnosis not present

## 2021-04-25 DIAGNOSIS — Z7982 Long term (current) use of aspirin: Secondary | ICD-10-CM | POA: Insufficient documentation

## 2021-04-25 DIAGNOSIS — D571 Sickle-cell disease without crisis: Secondary | ICD-10-CM

## 2021-04-25 DIAGNOSIS — R0602 Shortness of breath: Secondary | ICD-10-CM

## 2021-04-25 DIAGNOSIS — R079 Chest pain, unspecified: Secondary | ICD-10-CM | POA: Diagnosis not present

## 2021-04-25 DIAGNOSIS — R200 Anesthesia of skin: Secondary | ICD-10-CM | POA: Diagnosis not present

## 2021-04-25 LAB — CBC
HCT: 25.3 % — ABNORMAL LOW (ref 39.0–52.0)
Hemoglobin: 9.2 g/dL — ABNORMAL LOW (ref 13.0–17.0)
MCH: 34.3 pg — ABNORMAL HIGH (ref 26.0–34.0)
MCHC: 36.4 g/dL — ABNORMAL HIGH (ref 30.0–36.0)
MCV: 94.4 fL (ref 80.0–100.0)
Platelets: 484 10*3/uL — ABNORMAL HIGH (ref 150–400)
RBC: 2.68 MIL/uL — ABNORMAL LOW (ref 4.22–5.81)
RDW: 17.9 % — ABNORMAL HIGH (ref 11.5–15.5)
WBC: 9 10*3/uL (ref 4.0–10.5)
nRBC: 1 % — ABNORMAL HIGH (ref 0.0–0.2)

## 2021-04-25 LAB — BASIC METABOLIC PANEL
Anion gap: 6 (ref 5–15)
BUN: 9 mg/dL (ref 6–20)
CO2: 27 mmol/L (ref 22–32)
Calcium: 9 mg/dL (ref 8.9–10.3)
Chloride: 104 mmol/L (ref 98–111)
Creatinine, Ser: 0.74 mg/dL (ref 0.61–1.24)
GFR, Estimated: >60 mL/min (ref >60)
Glucose, Bld: 91 mg/dL (ref 70–99)
Potassium: 4.2 mmol/L (ref 3.5–5.1)
Sodium: 137 mmol/L (ref 135–145)

## 2021-04-25 LAB — TROPONIN I (HIGH SENSITIVITY): Troponin I (High Sensitivity): 6 ng/L (ref <18)

## 2021-04-25 MED ORDER — DIPHENHYDRAMINE HCL 50 MG/ML IJ SOLN
25.0000 mg | Freq: Once | INTRAMUSCULAR | Status: AC
  Administered 2021-04-25: 25 mg via INTRAVENOUS
  Filled 2021-04-25: qty 1

## 2021-04-25 MED ORDER — PROCHLORPERAZINE EDISYLATE 10 MG/2ML IJ SOLN
10.0000 mg | Freq: Once | INTRAMUSCULAR | Status: AC
  Administered 2021-04-25: 10 mg via INTRAVENOUS
  Filled 2021-04-25: qty 2

## 2021-04-25 MED ORDER — IBUPROFEN 200 MG PO TABS
600.0000 mg | ORAL_TABLET | Freq: Once | ORAL | Status: DC
  Filled 2021-04-25: qty 3

## 2021-04-25 NOTE — ED Provider Triage Note (Signed)
Emergency Medicine Provider Triage Evaluation Note ? ?Gregory Espinoza , a 31 y.o. male  was evaluated in triage.  Pt complains of shortness of breath and headache for 1 week. Patient states that he has had a constant headache since Tuesday that is mildly alleviated by Tylenol. He endorses shortness of breath during this time frame with only minimal exertion. Patient also complains of left sided weakness after exercise. He complains of intermittent left forearm numbness. The patient is having no weakness/numbness currently.  ? ?Review of Systems  ?Positive: Shortness of breath, headache, weakness, numbness ?Negative: Chest pain, abdominal pain ? ?Physical Exam  ?BP 137/67 (BP Location: Left Arm)   Pulse 65   Temp 98 ?F (36.7 ?C) (Oral)   Resp 18   Ht 6\' 6"  (1.981 m)   Wt 95.3 kg   SpO2 92%   BMI 24.27 kg/m?  ?Gen:   Awake, no distress   ?Resp:  Normal effort  ?MSK:   Moves extremities without difficulty  ?Other:  Sensation and strength equal bilaterally at this time. CN III-VI, XI, XII normal ? ?Medical Decision Making  ?Medically screening exam initiated at 4:17 PM.  Appropriate orders placed.  Gregory Espinoza was informed that the remainder of the evaluation will be completed by another provider, this initial triage assessment does not replace that evaluation, and the importance of remaining in the ED until their evaluation is complete. ? ? ?  ? , PA-C ?04/25/21 1623 ? ?

## 2021-04-25 NOTE — ED Provider Notes (Signed)
?[facility]-EMERGENCY DEPT ?Provider Note ? ?CSN: [id number] ?Arrival date & time: 04/25/21 1529 ? ?Chief Complaint(s) ?Numbness ? ?HPI ?Gregory Espinoza is a 31 y.o. male with sickle cell anemia on hydroxyurea who presents the emergency department for evaluation of multiple complaints including paresthesias, persistent headache, mild shortness of breath.  Patient saw his outpatient primary care provider stating that his home pulse ox was reading 86% and he had intermittent mild shortness of breath.  He also states that he has had mild left forearm and left wrist tingling and intermittent left lower extremity weakness with a persistent headache.  Patient was then sent in by primary care provider due to concerning shortness of breath and asymmetric weakness.  On arrival, patient states that the weakness has resolved as well as his paresthesias, but he does have a persistent headache.  He is saturating 99% on room air with a good pleth.  Denies abdominal pain, nausea, vomiting or other systemic symptoms. ? ?HPI ? ?Past Medical History ?Past Medical History:  ?Diagnosis Date  ? Erythrocytopenia 12/2018  ? Hemoglobin S-S disease (HCC)   ? History of PCR DNA positive for HSV1 10/2019  ? Sickle cell anemia (HCC)   ? Thrombocytopenia ([facility]) 12/2018  ? Vitamin D deficiency 12/2018  ? ?Patient Active Problem List  ? Diagnosis Date Noted  ? Abdominal pain, right lower quadrant 12/19/2018  ? Absolute anemia 12/13/2018  ? Hb-SS disease with acute chest syndrome ([facility]) 10/24/2018  ? Transaminitis 03/13/2018  ? Sickle cell disease, type SS ([facility]) 04/24/2014  ? ?Home Medication(s) ?Prior to Admission medications   ?Medication Sig Start Date End Date Taking? Authorizing Provider  ?aspirin EC 81 MG tablet Take 1 tablet (81 mg total) by mouth daily. Swallow whole. 02/03/20   Kallie LocksStroud, Natalie M, FNP  ?benzonatate (TESSALON) 100 MG capsule Take 1 capsule (100 mg total) by mouth every 8 (eight) hours. ?Patient not taking:  Reported on 11/17/2020 09/04/20   Roxy HorsemanBrowning, Robert, PA-C  ?Camphor-Menthol-Methyl Sal (SALONPAS DEEP RELIEVING) 3.02-08-13 % GEL Apply 1 patch topically every 8 (eight) hours. ?Patient not taking: Reported on 09/04/2020 07/12/20   Barbette MerinoKing, Crystal M, NP  ?folic acid (FOLVITE) 1 MG tablet Take 1 mg by mouth daily. 12/05/17   [provider]  ?hydroxyurea (HYDREA) 500 MG capsule Take 500 mg by mouth daily. May take with food to minimize GI side effects.    [provider]  ?Multiple Vitamin (ONE-A-DAY 55 PLUS PO) Take 1 tablet by mouth daily.    [provider]  ?Vitamin D, Ergocalciferol, (DRISDOL) 1.25 MG (50000 UNIT) CAPS capsule Take 1 capsule (50,000 Units total) by mouth every 7 (seven) days. X 12 weeks. ?Patient taking differently: Take 50,000 Units by mouth every 7 (seven) days. sunday 02/06/20   Kallie LocksStroud, Natalie M, FNP  ?                                                                                                                                  ?  Past Surgical History ?Past Surgical History:  ?Procedure Laterality Date  ? DENTAL SURGERY    ? ?Family History ?Family History  ?Problem Relation Age of Onset  ? Hypertension Mother   ? Hypertension Father   ? Sickle cell anemia Brother   ? ? ?Social History ?Social History  ? ?Tobacco Use  ? Smoking status: Never  ? Smokeless tobacco: Never  ?Vaping Use  ? Vaping Use: Never used  ?Substance Use Topics  ? Alcohol use: No  ?  Alcohol/week: 0.0 standard drinks  ? Drug use: No  ? ?Allergies ?Prilosec [omeprazole] ? ?Review of Systems ?Review of Systems  ?Respiratory:  Positive for shortness of breath.   ?Neurological:  Positive for weakness, numbness and headaches.  ? ?Physical Exam ?Vital Signs  ?I have reviewed the triage vital signs ?BP 120/74   Pulse 68   Temp 98 ?F (36.7 ?C) (Oral)   Resp 15   Ht 6\' 6"  (1.981 m)   Wt 95.3 kg   SpO2 97%   BMI 24.27 kg/m?  ? ?Physical Exam ?Vitals and nursing note reviewed.  ?Constitutional:   ?   General:  He is not in acute distress. ?   Appearance: He is well-developed.  ?HENT:  ?   Head: Normocephalic and atraumatic.  ?Eyes:  ?   Conjunctiva/sclera: Conjunctivae normal.  ?Cardiovascular:  ?   Rate and Rhythm: Normal rate and regular rhythm.  ?   Heart sounds: No murmur heard. ?Pulmonary:  ?   Effort: Pulmonary effort is normal. No respiratory distress.  ?   Breath sounds: Normal breath sounds.  ?Abdominal:  ?   Palpations: Abdomen is soft.  ?   Tenderness: There is no abdominal tenderness.  ?Musculoskeletal:     ?   General: No swelling.  ?   Cervical back: Neck supple.  ?Skin: ?   General: Skin is warm and dry.  ?   Capillary Refill: Capillary refill takes less than 2 seconds.  ?Neurological:  ?   Mental Status: He is alert.  ?   Cranial Nerves: No cranial nerve deficit.  ?   Sensory: No sensory deficit.  ?   Motor: No weakness.  ?Psychiatric:     ?   Mood and Affect: Mood normal.  ? ? ?ED Results and Treatments ?Labs ?(all labs ordered are listed, but only abnormal results are displayed) ?Labs Reviewed  ?CBC - Abnormal; Notable for the following components:  ?    Result Value  ? RBC 2.68 (*)   ? Hemoglobin 9.2 (*)   ? HCT 25.3 (*)   ? MCH 34.3 (*)   ? MCHC 36.4 (*)   ? RDW 17.9 (*)   ? Platelets 484 (*)   ? nRBC 1.0 (*)   ? All other components within normal limits  ?BASIC METABOLIC PANEL  ?TROPONIN I (HIGH SENSITIVITY)  ?                                                                                                                       ? ?  Radiology ?DG Chest 2 View ? ?Result Date: 04/25/2021 ?CLINICAL DATA:  Intermittent chest pain. Numbness and tingling left arm. EXAM: CHEST - 2 VIEW COMPARISON:  Chest two views 09/04/2020 FINDINGS: Cardiac silhouette and mediastinal contours are within normal limits. The lungs are clear. No pleural effusion or pneumothorax. No acute skeletal abnormality. IMPRESSION: No active cardiopulmonary disease. Electronically Signed   By: Neita Garnet M.D.   On: 04/25/2021 16:56  ? ?CT  Head Wo Contrast ? ?Result Date: 04/25/2021 ?CLINICAL DATA:  Transient ischemic attack (TIA) EXAM: CT HEAD WITHOUT CONTRAST TECHNIQUE: Contiguous axial images were obtained from the base of the skull through the vertex without intravenous contrast. RADIATION DOSE REDUCTION: This exam was performed according to the departmental dose-optimization program which includes automated exposure control, adjustment of the mA and/or kV according to patient size and/or use of iterative reconstruction technique. COMPARISON:  None. FINDINGS: Brain: No evidence of large-territorial acute infarction. No parenchymal hemorrhage. No mass lesion. No extra-axial collection. No mass effect or midline shift. No hydrocephalus. Basilar cisterns are patent. Vascular: No hyperdense vessel. Skull: No acute fracture or focal lesion. Sinuses/Orbits: Paranasal sinuses and mastoid air cells are clear. The orbits are unremarkable. Other: None. IMPRESSION: No acute intracranial abnormality. Electronically Signed   By: Tish Frederickson M.D.   On: 04/25/2021 22:42   ? ?Pertinent labs & imaging results that were available during my care of the patient were reviewed by me and considered in my medical decision making (see MDM for details). ? ?Medications Ordered in ED ?Medications  ?ibuprofen (ADVIL) tablet 600 mg (600 mg Oral Patient Refused/Not Given 04/25/21 2316)  ?prochlorperazine (COMPAZINE) injection 10 mg (10 mg Intravenous Given 04/25/21 2312)  ?diphenhydrAMINE (BENADRYL) injection 25 mg (25 mg Intravenous Given 04/25/21 2311)  ?                                                               ?                                                                    ?Procedures ?Procedures ? ?(including critical care time) ? ?Medical Decision Making / ED Course ? ? ?This patient presents to the ED for concern of headache, numbness, weakness, shortness of breath, this involves an extensive number of treatment options, and is a complaint that carries with it a  high risk of complications and morbidity.  The differential diagnosis includes complex migraine, CVA, intracranial mass, pneumonia, acute chest ? ?MDM: ?Seen emergency department for multiple complaints descri

## 2021-04-25 NOTE — ED Triage Notes (Signed)
Patient sent by PCP d/t numbness/tingling x1 week in the left arm which radiates down to the hand. He also reports headache. He also reports left-sided weakness. He states he also has had a few episodes of chest pain which resolves quickly. He denies SOB. Hx SCA.  ?

## 2021-04-25 NOTE — Patient Instructions (Signed)
Sickle Cell Anemia, Adult ?Sickle cell anemia is a condition where your red blood cells are shaped like sickles. Red blood cells carry oxygen through the body. Sickle-shaped cells do not live as long as normal red blood cells. They also clump together and block blood from flowing through the blood vessels. This prevents the body from getting enough oxygen. Sickle cell anemia causes organ damage and pain. It also increases the risk of infection. ?Follow these instructions at home: ?Medicines ?Take over-the-counter and prescription medicines only as told by your doctor. ?If you were prescribed an antibiotic medicine, take it as told by your doctor. Do not stop taking the antibiotic even if you start to feel better. ?If you develop a fever, do not take medicines to lower the fever right away. Tell your doctor about the fever. ?Managing pain, stiffness, and swelling ?Try these methods to help with pain: ?Use a heating pad. ?Take a warm bath. ?Distract yourself, such as by watching TV. ?Eating and drinking ?Drink enough fluid to keep your pee (urine) clear or pale yellow. Drink more in hot weather and during exercise. ?Limit or avoid alcohol. ?Eat a healthy diet. Eat plenty of fruits, vegetables, whole grains, and lean protein. ?Take vitamins and supplements as told by your doctor. ?Traveling ?When traveling, keep these with you: ?Your medical information. ?The names of your doctors. ?Your medicines. ?If you need to take an airplane, talk to your doctor first. ?Activity ?Rest often. ?Avoid exercises that make your heart beat much faster, such as jogging. ?General instructions ?Do not use products that have nicotine or tobacco, such as cigarettes and e-cigarettes. If you need help quitting, ask your doctor. ?Consider wearing a medical alert bracelet. ?Avoid being in high places (high altitudes), such as mountains. ?Avoid very hot or cold temperatures. ?Avoid places where the temperature changes a lot. ?Keep all follow-up  visits as told by your doctor. This is important. ?Contact a doctor if: ?A joint hurts. ?Your feet or hands hurt or swell. ?You feel tired (fatigued). ?Get help right away if: ?You have symptoms of infection. These include: ?Fever. ?Chills. ?Being very tired. ?Irritability. ?Poor eating. ?Throwing up (vomiting). ?You feel dizzy or faint. ?You have new stomach pain, especially on the left side. ?You have a an erection (priapism) that lasts more than 4 hours. ?You have numbness in your arms or legs. ?You have a hard time moving your arms or legs. ?You have trouble talking. ?You have pain that does not go away when you take medicine. ?You are short of breath. ?You are breathing fast. ?You have a long-term cough. ?You have pain in your chest. ?You have a bad headache. ?You have a stiff neck. ?Your stomach looks bloated even though you did not eat much. ?Your skin is pale. ?You suddenly cannot see well. ?Summary ?Sickle cell anemia is a condition where your red blood cells are shaped like sickles. ?Follow your doctor's advice on ways to manage pain, food to eat, activities to do, and steps to take for safe travel. ?Get medical help right away if you have any signs of infection, such as a fever. ?This information is not intended to replace advice given to you by your health care provider. Make sure you discuss any questions you have with your health care provider. ?Document Revised: 06/12/2019 Document Reviewed: 06/12/2019 ?Elsevier Patient Education ? 2022 [employer] Inc[employer] ? ?

## 2021-04-25 NOTE — Progress Notes (Signed)
? ?[place] [address], [place]  [address] ?Phone:  [phone]   Fax:  [phone] ? ? ?Established Patient Office Visit ? ?Subjective:  ?Patient ID: Gregory Espinoza, male    DOB: [birth year]  Age: 31 y.o. MRN: [id number] ? ?CC:  ?Chief Complaint  ?Patient presents with  ? Follow-up  ?  Patient is here today to discuss the shortness of breath and chest pains that he has been having x 1 week. Patient also states that he has been having numbness and tingling sensation on the left side of his body, head and hands x 1 week.  ? ? ?HPI ?[place]  KC Korf presents for shortness of breath. 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  ? ?Dyspnea ?Patient complains of shortness of breath. He reports that on Monday he went to gym. He reports that on Tuesday 86% oxgyen stat . Symptoms occur with activity. Symptoms began 1 week ago, unchanged since. Associated symptoms include  dyspnea on exertion. He is having left hand and leg tingling, numbness and weakness with walking. He is having headache with left neck swelling. Marland Kitchen He denies shortness of breath. He has not had recent travel. Weight has been stable. Symptoms are exacerbated by walking and any light activity.  Symptoms are alleviated by rest.  ? ? ? ?Past Medical History:  ?Diagnosis Date  ? Erythrocytopenia 12/2018  ? 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 History of PCR DNA positive for HSV1 10/2019  ? Sickle cell anemia (HCC)   ? Thrombocytopenia ([place]) 12/2018  ? Vitamin D deficiency 12/2018  ? ? ?Past Surgical History:  ?Procedure Laterality Date  ? DENTAL SURGERY    ? ? ?Family History  ?Problem Relation Age of Onset  ? Hypertension Mother   ? Hypertension Father   ? Sickle cell anemia Brother   ? ? ?Social History  ? ?Socioeconomic History  ?  Marital status: Single  ?  Spouse name: Not on file  ? Number of children: Not on file  ? Years of education: Not on file  ? Highest education level: Not on file  ?Occupational History  ? Not on file  ?Tobacco Use  ? Smoking status: Never  ? Smokeless tobacco: Never  ?Vaping Use  ? Vaping Use: Never used  ?Substance and Sexual Activity  ? Alcohol use: No  ?  Alcohol/week: 0.0 standard drinks  ? Drug use: No  ? Sexual activity: Yes  ?  Birth control/protection: Condom  ?Other Topics Concern  ? Not on file  ?Social History Narrative  ? Not on file  ? ?Social Determinants of Health  ? ?Financial Resource Strain: Not on file  ?Food Insecurity: Not on file  ?Transportation Needs: Not on file  ?Physical Activity: Not on file  ?Stress: Not on file  ?Social Connections: Not on file  ?Intimate Partner Violence: Not on file  ? ? ?Outpatient Medications Prior to Visit  ?Medication Sig Dispense Refill  ? aspirin EC 81 MG tablet Take 1 tablet (81 mg total) by mouth daily. Swallow whole. 30 tablet 11  ? folic acid (FOLVITE) 1 MG tablet Take 1 mg by mouth daily.    ? hydroxyurea (HYDREA) 500 MG capsule Take 500 mg by mouth daily. May take with food to minimize GI side effects.    ?  Multiple Vitamin (ONE-A-DAY 55 PLUS PO) Take 1 tablet by mouth daily.    ? Vitamin D, Ergocalciferol, (DRISDOL) 1.25 MG (50000 UNIT) CAPS capsule Take 1 capsule (50,000 Units total) by mouth every 7 (seven) days. X 12 weeks. (Patient taking differently: Take 50,000 Units by mouth every 7 (seven) days. sunday) 5 capsule 2  ? Camphor-Menthol-Methyl Sal (SALONPAS DEEP RELIEVING) 3.02-08-13 % GEL Apply 1 patch topically every 8 (eight) hours. (Patient not taking: Reported on 09/04/2020) 78 g 11  ? benzonatate (TESSALON) 100 MG capsule Take 1 capsule (100 mg total) by mouth every 8 (eight) hours. (Patient not taking: Reported on 11/17/2020) 21 capsule 0  ? ?No facility-administered medications prior to visit.  ? ? ?Allergies  ?Allergen Reactions  ? Prilosec  [Omeprazole] Other (See Comments)  ?  Pt states he can't breath too well  ? ? ?ROS ?Review of Systems ? ?  ?Objective:  ?  ?Physical Exam ?HENT:  ?   Head: Normocephalic and atraumatic.  ?   Nose: Nose normal.  ?   Mouth/Throat:  ?   Mouth: Mucous membranes are moist.  ?Cardiovascular:  ?   Rate and Rhythm: Normal rate and regular rhythm.  ?   Pulses: Normal pulses.  ?   Heart sounds: Normal heart sounds.  ?Pulmonary:  ?   Effort: Pulmonary effort is normal.  ?   Breath sounds: Normal breath sounds.  ?Abdominal:  ?   Palpations: Abdomen is soft.  ?Musculoskeletal:     ?   General: Normal range of motion.  ?   Cervical back: Normal range of motion.  ?   Right lower leg: No edema.  ?   Left lower leg: No edema.  ?Skin: ?   General: Skin is warm and dry.  ?   Capillary Refill: Capillary refill takes less than 2 seconds.  ?   Comments: Acne spot to forehead  ?Neurological:  ?   General: No focal deficit present.  ?   Mental Status: He is alert and oriented to person, place, and time.  ?Psychiatric:     ?   Mood and Affect: Mood normal.     ?   Behavior: Behavior normal.     ?   Thought Content: Thought content normal.     ?   Judgment: Judgment normal.  ? ? ?BP 123/65   Pulse 69   Temp 97.6 ?F (36.4 ?C)   Ht '6\' 5"'  (1.956 m)   Wt 93.9 kg   SpO2 98%   BMI 24.55 kg/m?  ?Wt Readings from Last 3 Encounters:  ?05/05/21 93.6 kg  ?05/02/21 93.5 kg  ?04/25/21 95.3 kg  ? ? ? ?Health Maintenance Due  ?Topic Date Due  ? COVID-19 Vaccine (1) Never done  ? ? ?There are no preventive care reminders to display for this patient. ? ?Lab Results  ?Component Value Date  ? TSH 4.339 08/16/2011  ? ?Lab Results  ?Component Value Date  ? WBC 10.9 (H) 05/05/2021  ? HGB 9.2 (L) 05/05/2021  ? HCT 27.2 (L) 05/05/2021  ? MCV 101 (H) 05/05/2021  ? PLT 256 05/05/2021  ? ?Lab Results  ?Component Value Date  ? NA 140 05/05/2021  ? K 4.9 05/05/2021  ? CO2 23 05/05/2021  ? GLUCOSE 63 (L) 05/05/2021  ? BUN 10 05/05/2021  ? CREATININE 0.79 05/05/2021   ? BILITOT 5.1 (H) 05/05/2021  ? ALKPHOS 54 05/05/2021  ? AST 29 05/05/2021  ? ALT 13 05/05/2021  ? PROT  7.5 05/05/2021  ? ALBUMIN 4.5 05/05/2021  ? CALCIUM 8.8 05/05/2021  ? ANIONGAP 6 04/25/2021  ? EGFR 123 05/05/2021  ? ?Lab Results  ?Component Value Date  ? CHOL 87 08/16/2011  ? ?Lab Results  ?Component Value Date  ? HDL 31 (L) 08/16/2011  ? ?Lab Results  ?Component Value Date  ? LDLCALC 39 08/16/2011  ? ?Lab Results  ?Component Value Date  ? TRIG 85 08/16/2011  ? ?Lab Results  ?Component Value Date  ? CHOLHDL 2.8 08/16/2011  ? ?No results found for: HGBA1C ? ?  ?Assessment & Plan:  ? ?Problem List Items Addressed This Visit   ? ?  ? Other  ? Sickle cell disease, type SS ([place]) - Primary  ? ?Other Visit Diagnoses   ? ? Chest pain, unspecified type    ?Recommended going to the ED due to symptoms and history of SCD.    ? Relevant Orders  ? DG Chest 2 View  ? Shortness of breath      ? Relevant Orders  ? DG Chest 2 View  ? ?  ? ? ?No orders of the defined types were placed in this encounter. ? ? ?Follow-up: Return in about 4 weeks (around 05/23/2021).  ? ? ?Vevelyn Francois, NP ?

## 2021-05-02 ENCOUNTER — Encounter: Payer: Self-pay | Admitting: Nurse Practitioner

## 2021-05-02 ENCOUNTER — Ambulatory Visit: Payer: BC Managed Care – PPO | Admitting: Nurse Practitioner

## 2021-05-02 ENCOUNTER — Other Ambulatory Visit: Payer: Self-pay | Admitting: Nurse Practitioner

## 2021-05-02 VITALS — BP 125/71 | HR 71 | Temp 98.4°F | Ht 77.0 in | Wt 206.0 lb

## 2021-05-02 DIAGNOSIS — R0602 Shortness of breath: Secondary | ICD-10-CM | POA: Diagnosis not present

## 2021-05-02 DIAGNOSIS — Z113 Encounter for screening for infections with a predominantly sexual mode of transmission: Secondary | ICD-10-CM | POA: Diagnosis not present

## 2021-05-02 LAB — POCT URINALYSIS DIP (CLINITEK)
Bilirubin, UA: NEGATIVE
Blood, UA: NEGATIVE
Glucose, UA: NEGATIVE mg/dL
Ketones, POC UA: NEGATIVE mg/dL
Leukocytes, UA: NEGATIVE
Nitrite, UA: NEGATIVE
POC PROTEIN,UA: NEGATIVE
Spec Grav, UA: 1.015 (ref 1.010–1.025)
Urobilinogen, UA: 0.2 E.U./dL
pH, UA: 6.5 (ref 5.0–8.0)

## 2021-05-02 MED ORDER — ALBUTEROL SULFATE HFA 108 (90 BASE) MCG/ACT IN AERS: 2.0000 | INHALATION_SPRAY | Freq: Four times a day (QID) | RESPIRATORY_TRACT | 0 refills | Status: DC | PRN

## 2021-05-02 NOTE — Patient Instructions (Addendum)
1. Shortness of breath ? ?- albuterol (VENTOLIN HFA) 108 (90 Base) MCG/ACT inhaler; Inhale 2 puffs into the lungs every 6 (six) hours as needed for wheezing or shortness of breath.  Dispense: 8 g; Refill: 0 ?- POCT URINALYSIS DIP (CLINITEK) ? ?2. Screen for STD (sexually transmitted disease) ? ?- RPR+HIV+GC+CT Panel ?- POCT URINALYSIS DIP (CLINITEK) ? ? ?Follow up: ? ?Follow up in 3 days with Enid Derry - may need repeat labs ?Please go to the ED with worsening symptoms ? ?

## 2021-05-02 NOTE — Progress Notes (Signed)
@Patient  ID: Gregory ColeKelechi  [name], male    DOB: [birth year], 31 y.o.   MRN: [id number] ? ?Chief Complaint  ?Patient presents with  ? Breathing Problem  ?  Pt stated he has shortness of breath for the past 3 weeks.   ? ? ?Referring provider: ?Barbette MerinoKing, Gregory M, NP ? ? ?HPI ? ?31 year old male with history of sickle cell. ? ?Patient presents today for follow-up visit.  Patient was seen in this office on 04/25/2021.  He was sent to the ED for further evaluation due to decreased O2 sats, shortness of breath, chest pain.  Overall his work-up in the ED was negative.  Patient states that he still does not feel 100% back to his baseline.  He has improved since previous visit.  Vital signs in office today are stable.  We discussed that we will keep a close follow-up with him.  Patient is requesting routine STD screening, in the office today. Denies f/c/s, n/v/d, hemoptysis, PND, chest pain or edema. ? ? ? ?Allergies  ?Allergen Reactions  ? Prilosec [Omeprazole] Other (See Comments)  ?  Pt states he can't breath too well  ? ? ?Immunization History  ?Administered Date(s) Administered  ? Influenza,inj,Quad PF,6+ Mos 03/14/2018, 11/07/2018  ? Influenza,inj,Quad PF,6-35 Mos 11/10/2019  ? Influenza-Unspecified 03/14/2018  ? Meningococcal Conjugate 04/27/2014  ? Pneumococcal Conjugate-13 04/27/2014  ? Pneumococcal Polysaccharide-23 03/14/2018  ? Rabies, IM 10/04/2020, 10/07/2020, 10/11/2020, 10/22/2020  ? ? ?Past Medical History:  ?Diagnosis Date  ? Erythrocytopenia 12/2018  ? Hemoglobin S-S disease (HCC)   ? History of PCR DNA positive for HSV1 10/2019  ? Sickle cell anemia (HCC)   ? Thrombocytopenia ([facility]CC) 12/2018  ? Vitamin D deficiency 12/2018  ? ? ?Tobacco History: ?Social History  ? ?Tobacco Use  ?Smoking Status Never  ?Smokeless Tobacco Never  ? ?Counseling given: Not Answered ? ? ?Outpatient Encounter Medications as of 05/02/2021  ?Medication Sig  ? albuterol (VENTOLIN HFA) 108 (90 Base) MCG/ACT inhaler Inhale 2 puffs into the lungs  every 6 (six) hours as needed for wheezing or shortness of breath.  ? aspirin EC 81 MG tablet Take 1 tablet (81 mg total) by mouth daily. Swallow whole.  ? folic acid (FOLVITE) 1 MG tablet Take 1 mg by mouth daily.  ? hydroxyurea (HYDREA) 500 MG capsule Take 500 mg by mouth daily. May take with food to minimize GI side effects.  ? Multiple Vitamin (ONE-A-DAY 55 PLUS PO) Take 1 tablet by mouth daily.  ? Vitamin D, Ergocalciferol, (DRISDOL) 1.25 MG (50000 UNIT) CAPS capsule Take 1 capsule (50,000 Units total) by mouth every 7 (seven) days. X 12 weeks. (Patient taking differently: Take 50,000 Units by mouth every 7 (seven) days. sunday)  ? benzonatate (TESSALON) 100 MG capsule Take 1 capsule (100 mg total) by mouth every 8 (eight) hours. (Patient not taking: Reported on 11/17/2020)  ? Camphor-Menthol-Methyl Sal (SALONPAS DEEP RELIEVING) 3.02-08-13 % GEL Apply 1 patch topically every 8 (eight) hours. (Patient not taking: Reported on 09/04/2020)  ? ?No facility-administered encounter medications on file as of 05/02/2021.  ? ? ? ?Review of Systems ? ?Review of Systems  ?Constitutional:  Positive for fatigue.  ?HENT: Negative.    ?Respiratory:  Positive for shortness of breath.   ?Cardiovascular: Negative.   ?Gastrointestinal: Negative.   ?Allergic/Immunologic: Negative.   ?Neurological: Negative.   ?Psychiatric/Behavioral: Negative.     ? ? ? ?Physical Exam ? ?BP 125/71 (BP Location: Right Arm, Patient Position: Sitting, Cuff Size: Normal)   Pulse  71   Temp 98.4 ?F (36.9 ?C)   Ht 6\' 5"  (1.956 m)   Wt 206 lb 0.6 oz (93.5 kg)   SpO2 96%   BMI 24.43 kg/m?  ? ?Wt Readings from Last 5 Encounters:  ?05/02/21 206 lb 0.6 oz (93.5 kg)  ?04/25/21 210 lb (95.3 kg)  ?04/25/21 207 lb (93.9 kg)  ?02/17/21 205 lb 6.4 oz (93.2 kg)  ?01/22/21 210 lb (95.3 kg)  ? ? ? ?Physical Exam ?Vitals and nursing note reviewed.  ?Constitutional:   ?   General: He is not in acute distress. ?   Appearance: He is well-developed.  ?Cardiovascular:  ?    Rate and Rhythm: Normal rate and regular rhythm.  ?Pulmonary:  ?   Effort: Pulmonary effort is normal.  ?   Breath sounds: Normal breath sounds.  ?Skin: ?   General: Skin is warm and dry.  ?Neurological:  ?   Mental Status: He is alert and oriented to person, place, and time.  ? ? ? ?Lab Results: ? ?CBC ?   ?Component Value Date/Time  ? WBC 9.0 04/25/2021 1627  ? RBC 2.68 (L) 04/25/2021 1627  ? HGB 9.2 (L) 04/25/2021 1627  ? HGB 9.6 (L) 05/03/2020 1616  ? HCT 25.3 (L) 04/25/2021 1627  ? HCT 27.9 (L) 05/03/2020 1616  ? PLT 484 (H) 04/25/2021 1627  ? PLT 140 (L) 05/03/2020 1616  ? MCV 94.4 04/25/2021 1627  ? MCV 108 (H) 05/03/2020 1616  ? MCH 34.3 (H) 04/25/2021 1627  ? MCHC 36.4 (H) 04/25/2021 1627  ? RDW 17.9 (H) 04/25/2021 1627  ? RDW 18.3 (H) 05/03/2020 1616  ? LYMPHSABS 0.8 09/04/2020 0047  ? LYMPHSABS 3.1 05/03/2020 1616  ? MONOABS 0.6 09/04/2020 0047  ? EOSABS 0.1 09/04/2020 0047  ? EOSABS 0.3 05/03/2020 1616  ? BASOSABS 0.1 09/04/2020 0047  ? BASOSABS 0.1 05/03/2020 1616  ? ? ?BMET ?   ?Component Value Date/Time  ? NA 137 04/25/2021 1627  ? NA 141 05/03/2020 1616  ? K 4.2 04/25/2021 1627  ? CL 104 04/25/2021 1627  ? CO2 27 04/25/2021 1627  ? GLUCOSE 91 04/25/2021 1627  ? BUN 9 04/25/2021 1627  ? BUN 7 05/03/2020 1616  ? CREATININE 0.74 04/25/2021 1627  ? CREATININE 0.70 04/07/2014 1823  ? CALCIUM 9.0 04/25/2021 1627  ? GFRNONAA >60 04/25/2021 1627  ? GFRAA 135 02/03/2020 0935  ? ? ?BNP ?No results found for: BNP ? ?ProBNP ?No results found for: PROBNP ? ?Imaging: ?DG Chest 2 View ? ?Result Date: 04/25/2021 ?CLINICAL DATA:  Intermittent chest pain. Numbness and tingling left arm. EXAM: CHEST - 2 VIEW COMPARISON:  Chest two views 09/04/2020 FINDINGS: Cardiac silhouette and mediastinal contours are within normal limits. The lungs are clear. No pleural effusion or pneumothorax. No acute skeletal abnormality. IMPRESSION: No active cardiopulmonary disease. Electronically Signed   By: 11/04/2020 M.D.   On:  04/25/2021 16:56  ? ?CT Head Wo Contrast ? ?Result Date: 04/25/2021 ?CLINICAL DATA:  Transient ischemic attack (TIA) EXAM: CT HEAD WITHOUT CONTRAST TECHNIQUE: Contiguous axial images were obtained from the base of the skull through the vertex without intravenous contrast. RADIATION DOSE REDUCTION: This exam was performed according to the departmental dose-optimization program which includes automated exposure control, adjustment of the mA and/or kV according to patient size and/or use of iterative reconstruction technique. COMPARISON:  None. FINDINGS: Brain: No evidence of large-territorial acute infarction. No parenchymal hemorrhage. No mass lesion. No extra-axial collection. No mass effect or  midline shift. No hydrocephalus. Basilar cisterns are patent. Vascular: No hyperdense vessel. Skull: No acute fracture or focal lesion. Sinuses/Orbits: Paranasal sinuses and mastoid air cells are clear. The orbits are unremarkable. Other: None. IMPRESSION: No acute intracranial abnormality. Electronically Signed   By: Tish Frederickson M.D.   On: 04/25/2021 22:42   ? ? ?Assessment & Plan:  ? ?Shortness of breath ?- albuterol (VENTOLIN HFA) 108 (90 Base) MCG/ACT inhaler; Inhale 2 puffs into the lungs every 6 (six) hours as needed for wheezing or shortness of breath.  Dispense: 8 g; Refill: 0 ?- POCT URINALYSIS DIP (CLINITEK) ? ?2. Screen for STD (sexually transmitted disease) ? ?- RPR+HIV+GC+CT Panel ?- POCT URINALYSIS DIP (CLINITEK) ? ? ?Follow up: ? ?Follow up in 3 days with Enid Derry - may need repeat labs ?Please go to the ED with worsening symptoms ? ? ? ? ?Ivonne Andrew, NP ?05/04/2021 ? ?

## 2021-05-03 LAB — RPR+HIV+GC+CT PANEL
HIV Screen 4th Generation wRfx: NONREACTIVE
RPR Ser Ql: NONREACTIVE

## 2021-05-04 ENCOUNTER — Encounter: Payer: Self-pay | Admitting: Nurse Practitioner

## 2021-05-04 DIAGNOSIS — R0602 Shortness of breath: Secondary | ICD-10-CM | POA: Insufficient documentation

## 2021-05-04 NOTE — Assessment & Plan Note (Signed)
-   albuterol (VENTOLIN HFA) 108 (90 Base) MCG/ACT inhaler; Inhale 2 puffs into the lungs every 6 (six) hours as needed for wheezing or shortness of breath.  Dispense: 8 g; Refill: 0 ?- POCT URINALYSIS DIP (CLINITEK) ? ?2. Screen for STD (sexually transmitted disease) ? ?- RPR+HIV+GC+CT Panel ?- POCT URINALYSIS DIP (CLINITEK) ? ? ?Follow up: ? ?Follow up in 3 days with Enid Derry - may need repeat labs ?Please go to the ED with worsening symptoms ?

## 2021-05-05 ENCOUNTER — Ambulatory Visit: Payer: BC Managed Care – PPO | Admitting: Nurse Practitioner

## 2021-05-05 ENCOUNTER — Encounter: Payer: Self-pay | Admitting: Nurse Practitioner

## 2021-05-05 VITALS — BP 115/70 | HR 74 | Temp 98.8°F | Ht 77.0 in | Wt 206.4 lb

## 2021-05-05 DIAGNOSIS — R0602 Shortness of breath: Secondary | ICD-10-CM

## 2021-05-05 DIAGNOSIS — D5700 Hb-SS disease with crisis, unspecified: Secondary | ICD-10-CM

## 2021-05-05 NOTE — Progress Notes (Signed)
? ?[facility] ?[place], [place]  [address] ?Phone:  [phone]   Fax:  [phone] ?Subjective:  ? Patient ID: Gregory Espinoza, male    DOB: 07-Sep-1990, 31 y.o.   MRN: [id number] ? ?Chief Complaint  ?Patient presents with  ? Follow-up  ?  Patient is here today for his follow up from last visit on 05/02/21 for shortness of breath. Patient states that he still has shortness of breath when he is walking long distance. Patient states that he was out of breath walking from his car into the building this morning.  ? ?HPI ?Gregory Espinoza 31 y.o. 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 To the [facility] for reevaluation of shortness of breath. ? ?States that he has worsening shortness of breath x 1 mth. Was evaluated 3 days ago and prescribed an inhaler. Has used the inhaler twice with moderate improvement symptoms. States that he continues to have increased efforts with breath, which is more pronounced after exertion. Denies having similar symptoms in the past. Has history of acute chest syndrome. Questioning whether symptoms are related to increased stress, but denies any recent increase stressors at work.  Denies any other concerns today. Was evaluated in ED for symptoms on 3/27, but was discharged to symptoms resolving.  ? ?Denies any fever. Denies any fatigue, chest pain, HA or dizziness. Denies any blurred vision, numbness or tingling. ? ? ?Past Medical History:  ?Diagnosis Date  ? Erythrocytopenia 12/2018  ? 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 History of PCR DNA positive for HSV1 10/2019  ? Sickle cell anemia (HCC)   ? Thrombocytopenia ([place]) 12/2018  ? Vitamin D deficiency 12/2018  ? ? ?Past Surgical History:  ?Procedure Laterality Date  ? DENTAL SURGERY    ? ? ?Family History   ?Problem Relation Age of Onset  ? Hypertension Mother   ? Hypertension Father   ? Sickle cell anemia Brother   ? ? ?Social History  ? ?Socioeconomic History  ? Marital status: Single  ?  Spouse name: Not on file  ? Number of children: Not on file  ? Years of education: Not on file  ? Highest education level: Not on file  ?Occupational History  ? Not on file  ?Tobacco Use  ? Smoking status: Never  ? Smokeless tobacco: Never  ?Vaping Use  ? Vaping Use: Never used  ?Substance and Sexual Activity  ? Alcohol use: No  ?  Alcohol/week: 0.0 standard drinks  ? Drug use: No  ? Sexual activity: Yes  ?  Birth control/protection: Condom  ?Other Topics Concern  ? Not on file  ?Social History Narrative  ? Not on file  ? ?Social Determinants of Health  ? ?Financial Resource Strain: Not on file  ?Food Insecurity: Not on file  ?Transportation Needs: Not on file  ?Physical Activity: Not on file  ?Stress: Not on file  ?Social Connections: Not on file  ?Intimate Partner Violence: Not on file  ? ? ?Outpatient Medications Prior to Visit  ?Medication Sig Dispense Refill  ? folic acid (FOLVITE) 1 MG tablet Take 1 mg by mouth daily.    ? hydroxyurea (HYDREA) 500 MG capsule Take 500 mg by mouth daily. May take with food to minimize GI side effects.    ? Multiple Vitamin (ONE-A-DAY  55 PLUS PO) Take 1 tablet by mouth daily.    ? Vitamin D, Ergocalciferol, (DRISDOL) 1.25 MG (50000 UNIT) CAPS capsule Take 1 capsule (50,000 Units total) by mouth every 7 (seven) days. X 12 weeks. (Patient taking differently: Take 50,000 Units by mouth every 7 (seven) days. sunday) 5 capsule 2  ? albuterol (VENTOLIN HFA) 108 (90 Base) MCG/ACT inhaler Inhale 2 puffs into the lungs every 6 (six) hours as needed for wheezing or shortness of breath. 8 g 0  ? aspirin EC 81 MG tablet Take 1 tablet (81 mg total) by mouth daily. Swallow whole. 30 tablet 11  ? Camphor-Menthol-Methyl Sal (SALONPAS DEEP RELIEVING) 3.02-08-13 % GEL Apply 1 patch topically every 8 (eight) hours.  (Patient not taking: Reported on 09/04/2020) 78 g 11  ? benzonatate (TESSALON) 100 MG capsule Take 1 capsule (100 mg total) by mouth every 8 (eight) hours. (Patient not taking: Reported on 11/17/2020) 21 capsule 0  ? ?No facility-administered medications prior to visit.  ? ? ?Allergies  ?Allergen Reactions  ? Prilosec [Omeprazole] Other (See Comments)  ?  Pt states he can't breath too well  ? ? ?Review of Systems  ?Constitutional:  Negative for chills, fever and malaise/fatigue.  ?Respiratory:  Positive for shortness of breath. Negative for cough.   ?Cardiovascular:  Negative for chest pain, palpitations and leg swelling.  ?Gastrointestinal:  Negative for abdominal pain, blood in stool, constipation, diarrhea, nausea and vomiting.  ?Skin: Negative.   ?Neurological: Negative.   ?Psychiatric/Behavioral:  Negative for depression. The patient is not nervous/anxious.   ?All other systems reviewed and are negative. ? ?   ?Objective:  ?  ?Physical Exam ?Vitals reviewed.  ?Constitutional:   ?   General: He is not in acute distress. ?   Appearance: Normal appearance. He is normal weight.  ?HENT:  ?   Head: Normocephalic.  ?Cardiovascular:  ?   Rate and Rhythm: Normal rate and regular rhythm.  ?   Pulses: Normal pulses.  ?   Heart sounds: Normal heart sounds.  ?   Comments: No obvious peripheral edema ?Pulmonary:  ?   Effort: Pulmonary effort is normal. No respiratory distress.  ?   Breath sounds: Normal breath sounds. No stridor. No wheezing, rhonchi or rales.  ?Chest:  ?   Chest wall: No tenderness.  ?Skin: ?   General: Skin is warm and dry.  ?   Capillary Refill: Capillary refill takes less than 2 seconds.  ?Neurological:  ?   General: No focal deficit present.  ?   Mental Status: He is alert and oriented to person, place, and time.  ?Psychiatric:     ?   Mood and Affect: Mood normal.     ?   Behavior: Behavior normal.     ?   Thought Content: Thought content normal.     ?   Judgment: Judgment normal.  ? ? ?BP 115/70    Pulse 74   Temp 98.8 ?F (37.1 ?C)   Ht _0  (1.956 m)   Wt 206 lb 6.4 oz (93.6 kg)   SpO2 96%   BMI 24.48 kg/m?  ?Wt Readings from Last 3 Encounters:  ?05/05/21 206 lb 6.4 oz (93.6 kg)  ?05/02/21 206 lb 0.6 oz (93.5 kg)  ?04/25/21 210 lb (95.3 kg)  ? ? ?Immunization History  ?Administered Date(s) Administered  ? Influenza,inj,Quad PF,6+ Mos 03/14/2018, 11/07/2018  ? Influenza,inj,Quad PF,6-35 Mos 11/10/2019  ? Influenza-Unspecified 03/14/2018  ? Meningococcal Conjugate 04/27/2014  ? Pneumococcal Conjugate-13  04/27/2014  ? Pneumococcal Polysaccharide-23 03/14/2018  ? Rabies, IM 10/04/2020, 10/07/2020, 10/11/2020, 10/22/2020  ? ? ?Diabetic Foot Exam - Simple   ?No data filed ?  ? ? ?Lab Results  ?Component Value Date  ? TSH 4.339 08/16/2011  ? ?Lab Results  ?Component Value Date  ? WBC 9.0 04/25/2021  ? HGB 9.2 (L) 04/25/2021  ? HCT 25.3 (L) 04/25/2021  ? MCV 94.4 04/25/2021  ? PLT 484 (H) 04/25/2021  ? ?Lab Results  ?Component Value Date  ? NA 137 04/25/2021  ? K 4.2 04/25/2021  ? CO2 27 04/25/2021  ? GLUCOSE 91 04/25/2021  ? BUN 9 04/25/2021  ? CREATININE 0.74 04/25/2021  ? BILITOT 5.6 (H) 09/04/2020  ? ALKPHOS 53 09/04/2020  ? AST 37 09/04/2020  ? ALT 19 09/04/2020  ? PROT 9.6 (H) 09/04/2020  ? ALBUMIN 5.0 09/04/2020  ? CALCIUM 9.0 04/25/2021  ? ANIONGAP 6 04/25/2021  ? EGFR 125 05/03/2020  ? ?Lab Results  ?Component Value Date  ? CHOL 87 08/16/2011  ? ?Lab Results  ?Component Value Date  ? HDL 31 (L) 08/16/2011  ? ?Lab Results  ?Component Value Date  ? LDLCALC 39 08/16/2011  ? ?Lab Results  ?Component Value Date  ? TRIG 85 08/16/2011  ? ?Lab Results  ?Component Value Date  ? CHOLHDL 2.8 08/16/2011  ? ?No results found for: HGBA1C ? ?   ?Assessment & Plan:  ? ?Problem List Items Addressed This Visit   ? ?  ? Other  ? Sickle cell disease, type SS ([place])  ? Relevant Orders  ? CBC with Differential/Platelet  ? Reticulocytes  ? Comprehensive metabolic panel  ? Shortness of breath - Primary  ? Relevant Orders  ?  CBC with Differential/Platelet  ? Reticulocytes  ? Comprehensive metabolic panel ?Informed to continue using inhaler as needed  ?Informed to schedule follow up appointment with primary hematologist within the next week

## 2021-05-05 NOTE — Patient Instructions (Signed)
You were seen today in the [facility] for reevaluation of shortness of breath. Labs were collected, results will be available via MyChart or, if abnormal, you will be contacted by clinic staff.  Please follow up as needed ?

## 2021-05-06 DIAGNOSIS — F411 Generalized anxiety disorder: Secondary | ICD-10-CM | POA: Diagnosis not present

## 2021-05-06 LAB — COMPREHENSIVE METABOLIC PANEL
ALT: 13 IU/L (ref 0–44)
AST: 29 IU/L (ref 0–40)
Albumin/Globulin Ratio: 1.5 (ref 1.2–2.2)
Albumin: 4.5 g/dL (ref 4.1–5.2)
Alkaline Phosphatase: 54 IU/L (ref 44–121)
BUN/Creatinine Ratio: 13 (ref 9–20)
BUN: 10 mg/dL (ref 6–20)
Bilirubin Total: 5.1 mg/dL — ABNORMAL HIGH (ref 0.0–1.2)
CO2: 23 mmol/L (ref 20–29)
Calcium: 8.8 mg/dL (ref 8.7–10.2)
Chloride: 103 mmol/L (ref 96–106)
Creatinine, Ser: 0.79 mg/dL (ref 0.76–1.27)
Globulin, Total: 3 g/dL (ref 1.5–4.5)
Glucose: 63 mg/dL — ABNORMAL LOW (ref 70–99)
Potassium: 4.9 mmol/L (ref 3.5–5.2)
Sodium: 140 mmol/L (ref 134–144)
Total Protein: 7.5 g/dL (ref 6.0–8.5)
eGFR: 123 mL/min/{1.73_m2} (ref >59)

## 2021-05-06 LAB — CBC WITH DIFFERENTIAL/PLATELET
Basophils Absolute: 0.1 10*3/uL (ref 0.0–0.2)
Basos: 1 %
EOS (ABSOLUTE): 0.2 10*3/uL (ref 0.0–0.4)
Eos: 2 %
Hematocrit: 27.2 % — ABNORMAL LOW (ref 37.5–51.0)
Hemoglobin: 9.2 g/dL — ABNORMAL LOW (ref 13.0–17.7)
Immature Grans (Abs): 0 10*3/uL (ref 0.0–0.1)
Immature Granulocytes: 0 %
Lymphocytes Absolute: 2.4 10*3/uL (ref 0.7–3.1)
Lymphs: 22 %
MCH: 34.2 pg — ABNORMAL HIGH (ref 26.6–33.0)
MCHC: 33.8 g/dL (ref 31.5–35.7)
MCV: 101 fL — ABNORMAL HIGH (ref 79–97)
Monocytes Absolute: 0.8 10*3/uL (ref 0.1–0.9)
Monocytes: 7 %
NRBC: 2 % — ABNORMAL HIGH (ref 0–0)
Neutrophils Absolute: 7.4 10*3/uL — ABNORMAL HIGH (ref 1.4–7.0)
Neutrophils: 68 %
Platelets: 256 10*3/uL (ref 150–450)
RBC: 2.69 x10E6/uL — CL (ref 4.14–5.80)
RDW: 19.9 % — ABNORMAL HIGH (ref 11.6–15.4)
WBC: 10.9 10*3/uL — ABNORMAL HIGH (ref 3.4–10.8)

## 2021-05-21 DIAGNOSIS — F411 Generalized anxiety disorder: Secondary | ICD-10-CM | POA: Diagnosis not present

## 2021-05-25 DIAGNOSIS — R0789 Other chest pain: Secondary | ICD-10-CM | POA: Diagnosis not present

## 2021-05-25 DIAGNOSIS — D571 Sickle-cell disease without crisis: Secondary | ICD-10-CM | POA: Diagnosis not present

## 2021-05-25 DIAGNOSIS — R0609 Other forms of dyspnea: Secondary | ICD-10-CM | POA: Diagnosis not present

## 2021-05-25 DIAGNOSIS — R079 Chest pain, unspecified: Secondary | ICD-10-CM | POA: Diagnosis not present

## 2021-05-25 DIAGNOSIS — E559 Vitamin D deficiency, unspecified: Secondary | ICD-10-CM | POA: Diagnosis not present

## 2021-05-25 DIAGNOSIS — R0600 Dyspnea, unspecified: Secondary | ICD-10-CM | POA: Diagnosis not present

## 2021-05-25 DIAGNOSIS — R002 Palpitations: Secondary | ICD-10-CM | POA: Diagnosis not present

## 2021-05-26 DIAGNOSIS — D571 Sickle-cell disease without crisis: Secondary | ICD-10-CM | POA: Diagnosis not present

## 2021-05-26 DIAGNOSIS — I2699 Other pulmonary embolism without acute cor pulmonale: Secondary | ICD-10-CM | POA: Diagnosis not present

## 2021-05-27 ENCOUNTER — Encounter: Payer: Self-pay | Admitting: Nurse Practitioner

## 2021-05-27 ENCOUNTER — Ambulatory Visit: Payer: BC Managed Care – PPO | Admitting: Nurse Practitioner

## 2021-05-27 VITALS — BP 124/63 | HR 73 | Temp 98.4°F | Ht 77.0 in | Wt 207.2 lb

## 2021-05-27 DIAGNOSIS — R0602 Shortness of breath: Secondary | ICD-10-CM | POA: Diagnosis not present

## 2021-05-27 DIAGNOSIS — R072 Precordial pain: Secondary | ICD-10-CM | POA: Diagnosis not present

## 2021-05-27 DIAGNOSIS — I2699 Other pulmonary embolism without acute cor pulmonale: Secondary | ICD-10-CM | POA: Diagnosis not present

## 2021-05-27 MED ORDER — APIXABAN 5 MG PO TABS: 5.0000 mg | ORAL_TABLET | Freq: Two times a day (BID) | ORAL | 0 refills | Status: DC

## 2021-05-27 NOTE — Progress Notes (Signed)
? ?[address] ?[place], [place]  [id number] ?Phone:  [phone]   Fax:  [phone] ?Subjective:  ? Patient ID: Gregory Espinoza, male    DOB: 03-31-1990, 31 y.o.   MRN: [id number] ? ?Chief Complaint  ?Patient presents with  ? Follow-up  ?  Pt is here for 4 weeks check up. Pt stated he still has shortness of breathe pt stated he has been having cramps in his chest comes and goes   ? ?HPI ?Gregory Espinoza 31 y.o. 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 To the [facility] for reevaluation of shortness of breath. ? ?Patient states that since last visit his symptoms have not improved, but have not worsened, was reevaluated at [facility] ED yesterday and diagnosed with PE. Initiated on Eliquis and took first dosage last night. Denies any other concerns today. ? ?Denies any fatigue, chest pain, HA or dizziness. Denies any blurred vision, numbness or tingling. ? ? ?Past Medical History:  ?Diagnosis Date  ? Erythrocytopenia 12/2018  ? 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 History of PCR DNA positive for HSV1 10/2019  ? Sickle cell anemia (HCC)   ? Thrombocytopenia ([place]) 12/2018  ? Vitamin D deficiency 12/2018  ? ? ?Past Surgical History:  ?Procedure Laterality Date  ? DENTAL SURGERY    ? ? ?Family History  ?Problem Relation Age of Onset  ? Hypertension Mother   ? Hypertension Father   ? Sickle cell anemia Brother   ? ? ?Social History  ? ?Socioeconomic History  ? Marital status: Single  ?  Spouse name: Not on file  ? Number of children: Not on file  ? Years of education: Not on file  ? Highest education level: Not on file  ?Occupational History  ? Not on file  ?Tobacco Use  ? Smoking status: Never  ? Smokeless tobacco: Never  ?Vaping Use  ? Vaping Use: Never used  ?Substance and Sexual Activity   ? Alcohol use: No  ?  Alcohol/week: 0.0 standard drinks  ? Drug use: No  ? Sexual activity: Yes  ?  Birth control/protection: Condom  ?Other Topics Concern  ? Not on file  ?Social History Narrative  ? Not on file  ? ?Social Determinants of Health  ? ?Financial Resource Strain: Not on file  ?Food Insecurity: Not on file  ?Transportation Needs: Not on file  ?Physical Activity: Not on file  ?Stress: Not on file  ?Social Connections: Not on file  ?Intimate Partner Violence: Not on file  ? ? ?Outpatient Medications Prior to Visit  ?Medication Sig Dispense Refill  ? albuterol (VENTOLIN HFA) 108 (90 Base) MCG/ACT inhaler Inhale 2 puffs into the lungs every 6 (six) hours as needed for wheezing or shortness of breath. 8 g 0  ? APIXABAN (ELIQUIS) VTE STARTER PACK (10MG AND 5MG) Take 2 tablets (99m) by mouth twice daily for 7 days, then take 1 tablet (5 mg) twice daily.    ? Camphor-Menthol-Methyl Sal (SALONPAS DEEP RELIEVING) 3.02-08-13 % GEL Apply 1 patch topically every 8 (eight) hours. 78 g 11  ? folic acid (FOLVITE) 1 MG tablet Take 1 mg by mouth daily.    ? hydroxyurea (HYDREA) 500 MG capsule Take 500 mg by mouth daily. May take with food to minimize GI side effects.    ?  Multiple Vitamin (ONE-A-DAY 55 PLUS PO) Take 1 tablet by mouth daily.    ? Vitamin D, Ergocalciferol, (DRISDOL) 1.25 MG (50000 UNIT) CAPS capsule Take 1 capsule (50,000 Units total) by mouth every 7 (seven) days. X 12 weeks. (Patient taking differently: Take 50,000 Units by mouth every 7 (seven) days. sunday) 5 capsule 2  ? aspirin EC 81 MG tablet Take 1 tablet (81 mg total) by mouth daily. Swallow whole. (Patient not taking: Reported on 05/27/2021) 30 tablet 11  ? ?No facility-administered medications prior to visit.  ? ? ?Allergies  ?Allergen Reactions  ? Peanut Butter Flavor Anaphylaxis  ?  Pt stated he has shortness of breathe when eating peanuts and peanut butter  ? Sunflower Oil Anaphylaxis  ?  Pt stated he has shortness of breathe when eating  sunflower seeds and oil  ? Prilosec [Omeprazole] Other (See Comments)  ?  Pt states he can't breath too well  ? ? ?Review of Systems  ?Constitutional:  Negative for chills, fever and malaise/fatigue.  ?Respiratory:  Positive for shortness of breath. Negative for cough.   ?Cardiovascular:  Negative for chest pain, palpitations and leg swelling.  ?Gastrointestinal:  Negative for abdominal pain, blood in stool, constipation, diarrhea, nausea and vomiting.  ?Skin: Negative.   ?Neurological: Negative.   ?Psychiatric/Behavioral:  Negative for depression. The patient is not nervous/anxious.   ?All other systems reviewed and are negative. ? ?   ?Objective:  ?  ?Physical Exam ?Vitals reviewed.  ?Constitutional:   ?   General: He is not in acute distress. ?   Appearance: Normal appearance. He is normal weight.  ?HENT:  ?   Head: Normocephalic.  ?Cardiovascular:  ?   Rate and Rhythm: Normal rate and regular rhythm.  ?   Pulses: Normal pulses.  ?   Heart sounds: Normal heart sounds.  ?   Comments: No obvious peripheral edema ?Pulmonary:  ?   Effort: Pulmonary effort is normal. No respiratory distress.  ?   Breath sounds: Normal breath sounds. No stridor. No wheezing, rhonchi or rales.  ?Chest:  ?   Chest wall: No tenderness.  ?Musculoskeletal:     ?   General: No swelling, tenderness, deformity or signs of injury. Normal range of motion.  ?   Right lower leg: No edema.  ?   Left lower leg: No edema.  ?Skin: ?   General: Skin is warm and dry.  ?   Capillary Refill: Capillary refill takes less than 2 seconds.  ?Neurological:  ?   Mental Status: He is alert.  ?Psychiatric:     ?   Mood and Affect: Mood normal.     ?   Behavior: Behavior normal.     ?   Thought Content: Thought content normal.     ?   Judgment: Judgment normal.  ? ? ?BP 124/63 (BP Location: Left Arm, Patient Position: Sitting, Cuff Size: Normal)   Pulse 73   Temp 98.4 ?F (36.9 ?C)   Ht '6\' 5"'  (1.956 m)   Wt 207 lb 3.2 oz (94 kg)   SpO2 97%   BMI 24.57 kg/m?   ?Wt Readings from Last 3 Encounters:  ?05/27/21 207 lb 3.2 oz (94 kg)  ?05/05/21 206 lb 6.4 oz (93.6 kg)  ?05/02/21 206 lb 0.6 oz (93.5 kg)  ? ? ?Immunization History  ?Administered Date(s) Administered  ? Influenza,inj,Quad PF,6+ Mos 03/14/2018, 11/07/2018  ? Influenza,inj,Quad PF,6-35 Mos 11/10/2019  ? Influenza-Unspecified 03/14/2018  ? Meningococcal Conjugate 04/27/2014  ?  Pneumococcal Conjugate-13 04/27/2014  ? Pneumococcal Polysaccharide-23 03/14/2018  ? Rabies, IM 10/04/2020, 10/07/2020, 10/11/2020, 10/22/2020  ? ? ?Diabetic Foot Exam - Simple   ?No data filed ?  ? ? ?Lab Results  ?Component Value Date  ? TSH 4.339 08/16/2011  ? ?Lab Results  ?Component Value Date  ? WBC 10.9 (H) 05/05/2021  ? HGB 9.2 (L) 05/05/2021  ? HCT 27.2 (L) 05/05/2021  ? MCV 101 (H) 05/05/2021  ? PLT 256 05/05/2021  ? ?Lab Results  ?Component Value Date  ? NA 140 05/05/2021  ? K 4.9 05/05/2021  ? CO2 23 05/05/2021  ? GLUCOSE 63 (L) 05/05/2021  ? BUN 10 05/05/2021  ? CREATININE 0.79 05/05/2021  ? BILITOT 5.1 (H) 05/05/2021  ? ALKPHOS 54 05/05/2021  ? AST 29 05/05/2021  ? ALT 13 05/05/2021  ? PROT 7.5 05/05/2021  ? ALBUMIN 4.5 05/05/2021  ? CALCIUM 8.8 05/05/2021  ? ANIONGAP 6 04/25/2021  ? EGFR 123 05/05/2021  ? ?Lab Results  ?Component Value Date  ? CHOL 87 08/16/2011  ? ?Lab Results  ?Component Value Date  ? HDL 31 (L) 08/16/2011  ? ?Lab Results  ?Component Value Date  ? LDLCALC 39 08/16/2011  ? ?Lab Results  ?Component Value Date  ? TRIG 85 08/16/2011  ? ?Lab Results  ?Component Value Date  ? CHOLHDL 2.8 08/16/2011  ? ?No results found for: HGBA1C ? ?   ?Assessment & Plan:  ? ?Problem List Items Addressed This Visit   ? ?  ? Other  ? Shortness of breath - Primary ?Continue previously established treatment plan ?Maintain follow up with hematology   ? ?Other Visit Diagnoses   ? ? PE (pulmonary thromboembolism) ([place])      ? Relevant Medications  ? APIXABAN (ELIQUIS) VTE STARTER PACK (10MG AND 5MG)  ? apixaban (ELIQUIS) 5 MG TABS  tablet, ordered during visit. Patient informed to initiate after completion of of starter pack   ? Other Relevant Orders  ? Ambulatory referral to Cardiology for additional management   ? ?Follow up in 3 mths for ree

## 2021-05-27 NOTE — Patient Instructions (Signed)
You were seen today in the [facility] for reevaluation of shortness of breath.  You were prescribed medications, please take as directed. Please follow up in 3 mths for reevaluation of PE and symptoms discussed ?

## 2021-05-28 DIAGNOSIS — R079 Chest pain, unspecified: Secondary | ICD-10-CM | POA: Diagnosis not present

## 2021-05-30 ENCOUNTER — Encounter: Payer: Self-pay | Admitting: Internal Medicine

## 2021-05-30 ENCOUNTER — Ambulatory Visit (INDEPENDENT_AMBULATORY_CARE_PROVIDER_SITE_OTHER): Payer: BC Managed Care – PPO | Admitting: Internal Medicine

## 2021-05-30 VITALS — BP 114/62 | HR 71 | Ht 78.0 in | Wt 210.4 lb

## 2021-05-30 DIAGNOSIS — R0602 Shortness of breath: Secondary | ICD-10-CM

## 2021-05-30 NOTE — Patient Instructions (Signed)

## 2021-05-30 NOTE — Progress Notes (Signed)
?Cardiology Office Note:   ? ?Date:  05/30/2021  ? ?ID:  Gregory Espinoza, DOB Apr 24, 1990, MRN [id number] ? ?PCP:  Orion Crook I, NP ?  ?[facility] Providers ?Cardiologist:  Maisie Fus, MD    ? ?Referring MD: Kathrynn Speed, NP  ? ?No chief complaint on file. ?PE ? ?History of Present Illness:   ? ?Gregory Espinoza is a 31 y.o. male with a hx of sickle cell, BL distal segment/subsegmental PE4/27/2023 ? ?Was having SOB. CTA at [facility] showed PE. No RV strain.He's on anticoagulation. EKG shows LVH. He was started on eliquis and still having SOB.  Parents with high blood pressure.  No syncope.  ? ? ?CT PE 4/27/223 ?Bilateral lower lobe predominant distal segmental/subsegmental pulmonary emboli. No evidence of right heart strain.  ? ?Cardiology Studies: ?TTE 02/26/2020- 55-60%. No valve dx. No pericardial effusion. No hypertrophy ? ?Past Medical History:  ?Diagnosis Date  ? Erythrocytopenia 12/2018  ? Hemoglobin S-S disease (HCC)   ? History of PCR DNA positive for HSV1 10/2019  ? Sickle cell anemia (HCC)   ? Thrombocytopenia (HCC) 12/2018  ? Vitamin D deficiency 12/2018  ? ? ?Past Surgical History:  ?Procedure Laterality Date  ? DENTAL SURGERY    ? ? ?Current Medications: ?Current Meds  ?Medication Sig  ? albuterol (VENTOLIN HFA) 108 (90 Base) MCG/ACT inhaler Inhale 2 puffs into the lungs every 6 (six) hours as needed for wheezing or shortness of breath.  ? apixaban (ELIQUIS) 5 MG TABS tablet Take 1 tablet (5 mg total) by mouth 2 (two) times daily.  ? APIXABAN (ELIQUIS) VTE STARTER PACK (10MG  AND 5MG ) Take 2 tablets (10mg ) by mouth twice daily for 7 days, then take 1 tablet (5 mg) twice daily.  ? Camphor-Menthol-Methyl Sal (SALONPAS DEEP RELIEVING) 3.02-08-13 % GEL Apply 1 patch topically every 8 (eight) hours.  ? folic acid (FOLVITE) 1 MG tablet Take 1 mg by mouth daily.  ? hydroxyurea (HYDREA) 500 MG capsule Take 500 mg by mouth daily. May take with food to minimize GI side effects.  ? Multiple  Vitamin (ONE-A-DAY 55 PLUS PO) Take 1 tablet by mouth daily.  ? Vitamin D, Ergocalciferol, (DRISDOL) 1.25 MG (50000 UNIT) CAPS capsule Take 1 capsule (50,000 Units total) by mouth every 7 (seven) days. X 12 weeks. (Patient taking differently: Take 50,000 Units by mouth every 7 (seven) days. sunday)  ?  ? ?Allergies:   Peanut butter flavor, Sunflower oil, and Prilosec [omeprazole]  ? ?Social History  ? ?Socioeconomic History  ? Marital status: Single  ?  Spouse name: Not on file  ? Number of children: Not on file  ? Years of education: Not on file  ? Highest education level: Not on file  ?Occupational History  ? Not on file  ?Tobacco Use  ? Smoking status: Never  ? Smokeless tobacco: Never  ?Vaping Use  ? Vaping Use: Never used  ?Substance and Sexual Activity  ? Alcohol use: No  ?  Alcohol/week: 0.0 standard drinks  ? Drug use: No  ? Sexual activity: Yes  ?  Birth control/protection: Condom  ?Other Topics Concern  ? Not on file  ?Social History Narrative  ? Not on file  ? ?Social Determinants of Health  ? ?Financial Resource Strain: Not on file  ?Food Insecurity: Not on file  ?Transportation Needs: Not on file  ?Physical Activity: Not on file  ?Stress: Not on file  ?Social Connections: Not on file  ?  ? ?  Family History: ?The patient's family history includes Hypertension in his father and mother; Sickle cell anemia in his brother. ? ?ROS:   ?Please see the history of present illness.    ? All other systems reviewed and are negative. ? ?EKGs/Labs/Other Studies Reviewed:   ? ?The following studies were reviewed today: ? ? ?EKG:  EKG is  ordered today.  The ekg ordered today demonstrates  ? ?NSR, LV.  Early  repol ? ?Recent Labs: ?05/05/2021: ALT 13; BUN 10; Creatinine, Ser 0.79; Hemoglobin 9.2; Platelets 256; Potassium 4.9; Sodium 140  ?Recent Lipid Panel ?   ?Component Value Date/Time  ? CHOL 87 08/16/2011 1139  ? TRIG 85 08/16/2011 1139  ? HDL 31 (L) 08/16/2011 1139  ? CHOLHDL 2.8 08/16/2011 1139  ? VLDL 17 08/16/2011  1139  ? LDLCALC 39 08/16/2011 1139  ? ? ? ?Risk Assessment/Calculations:   ?  ? ?    ? ?Physical Exam:   ? ?VS:  ? ?Vitals:  ? 05/30/21 1554  ?BP: 114/62  ?Pulse: 71  ?SpO2: 96%  ? ? ? ?Wt Readings from Last 3 Encounters:  ?05/30/21 210 lb 6.4 oz (95.4 kg)  ?05/27/21 207 lb 3.2 oz (94 kg)  ?05/05/21 206 lb 6.4 oz (93.6 kg)  ?  ? ?GEN:  Well nourished, well developed in no acute distress ?HEENT: Normal ?NECK: No JVD; No carotid bruits ?LYMPHATICS: No lymphadenopathy ?CARDIAC: RRR, no murmurs, rubs, gallops ?RESPIRATORY:  Clear to auscultation without rales, wheezing or rhonchi  ?ABDOMEN: Soft, non-tender, non-distended ?MUSCULOSKELETAL:  No edema; No deformity  ?SKIN: Warm and dry ?NEUROLOGIC:  Alert and oriented x 3 ?PSYCHIATRIC:  Normal affect  ? ?ASSESSMENT:   ? ?PE/SOB: He did not have evidence of RV strain with PE. Noted in the distal segments, not significant saddle PE. He is having some SOB.  O2 sat good today. May desat with activity, can exacerbate sickling. He had signs of hemolysis in the hospital. SOB likely related to anemia and PE. We discussed this can take time to improve. For further work up recommend continuing to see his hematologist at [facility]. ? ?LVH: normal variant. Echo was normal.  ?PLAN:   ? ?In order of problems listed above: ? ?Follow up as needed (he does not have cardiac disease) ? ?   ? ? ?Medication Adjustments/Labs and Tests Ordered: ?Current medicines are reviewed at length with the patient today.  Concerns regarding medicines are outlined above.  ?No orders of the defined types were placed in this encounter. ? ?No orders of the defined types were placed in this encounter. ? ? ?Patient Instructions  ?Medication Instructions:  ?No Changes In Medications at this time.  ?*If you need a refill on your cardiac medications before your next appointment, please call your pharmacy* ? ?Lab Work: ?None Ordered At This Time.  ?If you have labs (blood work) drawn today and your tests are completely  normal, you will receive your results only by: ?MyChart Message (if you have MyChart) OR ?A paper copy in the mail ?If you have any lab test that is abnormal or we need to change your treatment, we will call you to review the results. ? ?Testing/Procedures: ?None Ordered At This Time.  ? ?Follow-Up: ?At [facility], you and your health needs are our priority.  As part of our continuing mission to provide you with exceptional heart care, we have created designated Provider Care Teams.  These Care Teams include your primary Cardiologist (physician) and Advanced Practice Providers (APPs -  Physician Assistants and Nurse Practitioners) who all work together to provide you with the care you need, when you need it. ? ?Your next appointment:   ?AS NEEDED  ? ?The format for your next appointment:   ?In Person ? ?Provider:   ?Maisie Fus, MD   ? ? ? ? ? ?   ? ?Signed, ?Maisie Fus, MD  ?05/30/2021 4:24 PM    ?[facility] ?

## 2021-06-01 ENCOUNTER — Telehealth: Payer: Self-pay

## 2021-06-01 NOTE — Telephone Encounter (Signed)
PA has been started for the Eliquis 5mg  tablets. ?

## 2021-06-01 NOTE — Telephone Encounter (Signed)
Case OZ:[id number];ONGEXB:[id number];Review Type:Prior Auth;Coverage ?Start Date:05/02/2021;Coverage End Date:06/01/2022; ?

## 2021-07-13 ENCOUNTER — Other Ambulatory Visit: Payer: Self-pay

## 2021-07-13 DIAGNOSIS — I2699 Other pulmonary embolism without acute cor pulmonale: Secondary | ICD-10-CM

## 2021-07-13 MED ORDER — APIXABAN 5 MG PO TABS: 5.0000 mg | ORAL_TABLET | Freq: Two times a day (BID) | ORAL | 0 refills | Status: DC

## 2021-08-18 ENCOUNTER — Ambulatory Visit: Payer: BC Managed Care – PPO | Admitting: Nurse Practitioner

## 2021-08-24 DIAGNOSIS — E559 Vitamin D deficiency, unspecified: Secondary | ICD-10-CM | POA: Diagnosis not present

## 2021-08-24 DIAGNOSIS — R072 Precordial pain: Secondary | ICD-10-CM | POA: Diagnosis not present

## 2021-08-24 DIAGNOSIS — R079 Chest pain, unspecified: Secondary | ICD-10-CM | POA: Diagnosis not present

## 2021-08-24 DIAGNOSIS — D5701 Hb-SS disease with acute chest syndrome: Secondary | ICD-10-CM | POA: Diagnosis not present

## 2021-08-24 DIAGNOSIS — R0609 Other forms of dyspnea: Secondary | ICD-10-CM | POA: Diagnosis not present

## 2021-08-24 DIAGNOSIS — D5700 Hb-SS disease with crisis, unspecified: Secondary | ICD-10-CM | POA: Diagnosis not present

## 2021-08-24 DIAGNOSIS — R002 Palpitations: Secondary | ICD-10-CM | POA: Diagnosis not present

## 2021-08-24 DIAGNOSIS — G8929 Other chronic pain: Secondary | ICD-10-CM | POA: Diagnosis not present

## 2021-08-26 ENCOUNTER — Encounter: Payer: Self-pay | Admitting: Nurse Practitioner

## 2021-08-26 ENCOUNTER — Ambulatory Visit: Payer: BC Managed Care – PPO | Admitting: Nurse Practitioner

## 2021-08-26 VITALS — BP 120/67 | HR 72 | Temp 97.6°F | Ht 77.0 in | Wt 212.5 lb

## 2021-08-26 DIAGNOSIS — Z1283 Encounter for screening for malignant neoplasm of skin: Secondary | ICD-10-CM | POA: Diagnosis not present

## 2021-08-26 DIAGNOSIS — Z0000 Encounter for general adult medical examination without abnormal findings: Secondary | ICD-10-CM | POA: Diagnosis not present

## 2021-08-26 NOTE — Progress Notes (Signed)
@Patient  ID: , male    DOB: 25-Sep-1990, 31 y.o.   MRN: [id number]  Chief Complaint  Patient presents with   Follow-up    Pt is for 3 month's follow up visit. Pt still has SOB and chest pain but it is not as frequent as before.    Referring provider: [id number], NP   HPI  Gregory Espinoza 31 y.o. 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 To the [facility] for reevaluation of shortness of breath.   Patient presents today for follow-up visit.  He has been seen by [facility].  Patient has been seen by cardiology and hematology for recent PE.  He does have a follow-up appointment with hematology scheduled.  This is through [facility].  Patient states that he is not on blood thinners and is doing well.  He was told he would have to continue blood thinner at current dose and then decrease by 1/2 in 3 months.  Patient states that he does still have intermittent chest pain at times but is doing much better. Denies f/c/s, n/v/d, hemoptysis, PND, leg swelling Denies chest pain or edema        Allergies  Allergen Reactions   Peanut Butter Flavor Anaphylaxis    Pt stated he has shortness of breathe when eating peanuts and peanut butter   Sunflower Oil Anaphylaxis    Pt stated he has shortness of breathe when eating sunflower seeds and oil   Prilosec [Omeprazole] Other (See Comments)    Pt states he can't breath too well    Immunization History  Administered Date(s) Administered   Influenza,inj,Quad PF,6+ Mos 03/14/2018, 11/07/2018   Influenza,inj,Quad PF,6-35 Mos 11/10/2019   Influenza-Unspecified 03/14/2018   Meningococcal Conjugate 04/27/2014   Pneumococcal Conjugate-13 04/27/2014   Pneumococcal Polysaccharide-23 03/14/2018   [place], IM 10/04/2020, 10/07/2020, 10/11/2020,  10/22/2020    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   Sickle cell anemia (HCC)    Thrombocytopenia (HCC) 12/2018   Vitamin D deficiency 12/2018    Tobacco History: Social History   Tobacco Use  Smoking Status Never  Smokeless Tobacco Never   Counseling given: Not Answered   Outpatient Encounter Medications as of 08/26/2021  Medication Sig   albuterol (VENTOLIN HFA) 108 (90 Base) MCG/ACT inhaler Inhale 2 puffs into the lungs every 6 (six) hours as needed for wheezing or shortness of breath.   apixaban (ELIQUIS) 5 MG TABS tablet Take 5 mg by mouth in the morning and at bedtime.   APIXABAN (ELIQUIS) VTE STARTER PACK (10MG  AND 5MG ) Take 2 tablets (10mg ) by mouth twice daily for 7 days, then take 1 tablet (5 mg) twice daily.   folic acid (FOLVITE) 1 MG tablet Take 1 mg by mouth daily.   hydroxyurea (HYDREA) 500 MG capsule Take 500 mg by mouth daily. May take with food to minimize GI side effects.   Multiple Vitamin (ONE-A-DAY 55 PLUS PO) Take 1 tablet by mouth daily.   Vitamin D, Ergocalciferol, (DRISDOL) 1.25 MG (50000 UNIT) CAPS capsule Take 1 capsule (50,000 Units total) by mouth every 7 (seven) days. X 12 weeks.   apixaban (ELIQUIS) 5 MG TABS tablet Take 1 tablet (5 mg total) by mouth 2 (  two) times daily.   Camphor-Menthol-Methyl Sal (SALONPAS DEEP RELIEVING) 3.02-08-13 % GEL Apply 1 patch topically every 8 (eight) hours. (Patient not taking: Reported on 08/26/2021)   No facility-administered encounter medications on file as of 08/26/2021.     Review of Systems  Review of Systems  Constitutional: Negative.   HENT: Negative.    Cardiovascular: Negative.   Gastrointestinal: Negative.   Allergic/Immunologic: Negative.   Neurological: Negative.   Psychiatric/Behavioral: Negative.         Physical Exam  BP 120/67 (BP Location: Left Arm, Patient Position: Sitting, Cuff  Size: Normal)   Pulse 72   Temp 97.6 F (36.4 C)   Ht 6\' 5"  (1.956 m)   Wt 212 lb 8 oz (96.4 kg)   SpO2 100%   BMI 25.20 kg/m   Wt Readings from Last 5 Encounters:  08/26/21 212 lb 8 oz (96.4 kg)  05/30/21 210 lb 6.4 oz (95.4 kg)  05/27/21 207 lb 3.2 oz (94 kg)  05/05/21 206 lb 6.4 oz (93.6 kg)  05/02/21 206 lb 0.6 oz (93.5 kg)     Physical Exam Vitals and nursing note reviewed.  Constitutional:      General: He is not in acute distress.    Appearance: He is well-developed.  Cardiovascular:     Rate and Rhythm: Normal rate and regular rhythm.  Pulmonary:     Effort: Pulmonary effort is normal.     Breath sounds: Normal breath sounds.  Skin:    General: Skin is warm and dry.  Neurological:     Mental Status: He is alert and oriented to person, place, and time.      Lab Results:  CBC    Component Value Date/Time   WBC 10.9 (H) 05/05/2021 1505   WBC 9.0 04/25/2021 1627   RBC 2.69 (LL) 05/05/2021 1505   RBC 2.68 (L) 04/25/2021 1627   HGB 9.2 (L) 05/05/2021 1505   HCT 27.2 (L) 05/05/2021 1505   PLT 256 05/05/2021 1505   MCV 101 (H) 05/05/2021 1505   MCH 34.2 (H) 05/05/2021 1505   MCH 34.3 (H) 04/25/2021 1627   MCHC 33.8 05/05/2021 1505   MCHC 36.4 (H) 04/25/2021 1627   RDW 19.9 (H) 05/05/2021 1505   LYMPHSABS 2.4 05/05/2021 1505   MONOABS 0.6 09/04/2020 0047   EOSABS 0.2 05/05/2021 1505   BASOSABS 0.1 05/05/2021 1505    BMET    Component Value Date/Time   NA 140 05/05/2021 1505   K 4.9 05/05/2021 1505   CL 103 05/05/2021 1505   CO2 23 05/05/2021 1505   GLUCOSE 63 (L) 05/05/2021 1505   GLUCOSE 91 04/25/2021 1627   BUN 10 05/05/2021 1505   CREATININE 0.79 05/05/2021 1505   CREATININE 0.70 04/07/2014 1823   CALCIUM 8.8 05/05/2021 1505   GFRNONAA >60 04/25/2021 1627   GFRAA 135 02/03/2020 0935    BNP No results found for: "BNP"  ProBNP No results found for: "PROBNP"  Imaging: No results found.   Assessment & Plan:   Routine adult health  maintenance - Lipid Panel - TSH - RPR+HIV+GC+CT Panel   2. Skin cancer screening  - Ambulatory referral to Dermatology   Follow up:  Follow up in 3 months or sooner if needed     04/02/2020, NP 08/26/2021

## 2021-08-26 NOTE — Assessment & Plan Note (Signed)
-   Lipid Panel - TSH - RPR+HIV+GC+CT Panel   2. Skin cancer screening  - Ambulatory referral to Dermatology   Follow up:  Follow up in 3 months or sooner if needed

## 2021-08-26 NOTE — Patient Instructions (Addendum)
1. Routine adult health maintenance  - Lipid Panel - TSH - RPR+HIV+GC+CT Panel   2. Skin cancer screening  - Ambulatory referral to Dermatology   Follow up:  Follow up in 3 months or sooner if needed    Health Maintenance, Male Adopting a healthy lifestyle and getting preventive care are important in promoting health and wellness. Ask your health care provider about: The right schedule for you to have regular tests and exams. Things you can do on your own to prevent diseases and keep yourself healthy. What should I know about diet, weight, and exercise? Eat a healthy diet  Eat a diet that includes plenty of vegetables, fruits, low-fat dairy products, and lean protein. Do not eat a lot of foods that are high in solid fats, added sugars, or sodium. Maintain a healthy weight Body mass index (BMI) is a measurement that can be used to identify possible weight problems. It estimates body fat based on height and weight. Your health care provider can help determine your BMI and help you achieve or maintain a healthy weight. Get regular exercise Get regular exercise. This is one of the most important things you can do for your health. Most adults should: Exercise for at least 150 minutes each week. The exercise should increase your heart rate and make you sweat (moderate-intensity exercise). Do strengthening exercises at least twice a week. This is in addition to the moderate-intensity exercise. Spend less time sitting. Even light physical activity can be beneficial. Watch cholesterol and blood lipids Have your blood tested for lipids and cholesterol at 30 years of age, then have this test every 5 years. You may need to have your cholesterol levels checked more often if: Your lipid or cholesterol levels are high. You are older than 30 years of age. You are at high risk for heart disease. What should I know about cancer screening? Many types of cancers can be detected early and may  often be prevented. Depending on your health history and family history, you may need to have cancer screening at various ages. This may include screening for: Colorectal cancer. Prostate cancer. Skin cancer. Lung cancer. What should I know about heart disease, diabetes, and high blood pressure? Blood pressure and heart disease High blood pressure causes heart disease and increases the risk of stroke. This is more likely to develop in people who have high blood pressure readings or are overweight. Talk with your health care provider about your target blood pressure readings. Have your blood pressure checked: Every 3-5 years if you are 66-50 years of age. Every year if you are 52 years old or older. If you are between the ages of 74 and 22 and are a current or former smoker, ask your health care provider if you should have a one-time screening for abdominal aortic aneurysm (AAA). Diabetes Have regular diabetes screenings. This checks your fasting blood sugar level. Have the screening done: Once every three years after age 8 if you are at a normal weight and have a low risk for diabetes. More often and at a younger age if you are overweight or have a high risk for diabetes. What should I know about preventing infection? Hepatitis B If you have a higher risk for hepatitis B, you should be screened for this virus. Talk with your health care provider to find out if you are at risk for hepatitis B infection. Hepatitis C Blood testing is recommended for: Everyone born from [birth year] through 1964. Anyone with known risk  factors for hepatitis C. Sexually transmitted infections (STIs) You should be screened each year for STIs, including gonorrhea and chlamydia, if: You are sexually active and are younger than 30 years of age. You are older than 30 years of age and your health care provider tells you that you are at risk for this type of infection. Your sexual activity has changed since you were last  screened, and you are at increased risk for chlamydia or gonorrhea. Ask your health care provider if you are at risk. Ask your health care provider about whether you are at high risk for HIV. Your health care provider may recommend a prescription medicine to help prevent HIV infection. If you choose to take medicine to prevent HIV, you should first get tested for HIV. You should then be tested every 3 months for as long as you are taking the medicine. Follow these instructions at home: Alcohol use Do not drink alcohol if your health care provider tells you not to drink. If you drink alcohol: Limit how much you have to 0-2 drinks a day. Know how much alcohol is in your drink. In the [place] one drink equals one 12 oz bottle of beer (355 mL), one 5 oz glass of wine (148 mL), or one 1 oz glass of hard liquor (44 mL). Lifestyle Do not use any products that contain nicotine or tobacco. These products include cigarettes, chewing tobacco, and vaping devices, such as e-cigarettes. If you need help quitting, ask your health care provider. Do not use street drugs. Do not share needles. Ask your health care provider for help if you need support or information about quitting drugs. General instructions Schedule regular health, dental, and eye exams. Stay current with your vaccines. Tell your health care provider if: You often feel depressed. You have ever been abused or do not feel safe at home. Summary Adopting a healthy lifestyle and getting preventive care are important in promoting health and wellness. Follow your health care provider's instructions about healthy diet, exercising, and getting tested or screened for diseases. Follow your health care provider's instructions on monitoring your cholesterol and blood pressure. This information is not intended to replace advice given to you by your health care provider. Make sure you discuss any questions you have with your health care provider. Document  Revised: 06/07/2020 Document Reviewed: 06/07/2020 Elsevier Patient Education  2023 [employer].

## 2021-08-27 ENCOUNTER — Ambulatory Visit (HOSPITAL_COMMUNITY)
Admission: EM | Admit: 2021-08-27 | Discharge: 2021-08-27 | Disposition: A | Discharge status: Home / Self Care | Payer: BC Managed Care – PPO | Attending: Physician Assistant | Admitting: Physician Assistant

## 2021-08-27 ENCOUNTER — Encounter (HOSPITAL_COMMUNITY): Payer: Self-pay

## 2021-08-27 DIAGNOSIS — R369 Urethral discharge, unspecified: Secondary | ICD-10-CM | POA: Diagnosis not present

## 2021-08-27 DIAGNOSIS — R300 Dysuria: Secondary | ICD-10-CM | POA: Diagnosis not present

## 2021-08-27 DIAGNOSIS — N342 Other urethritis: Secondary | ICD-10-CM | POA: Diagnosis not present

## 2021-08-27 LAB — LIPID PANEL
Chol/HDL Ratio: 2.2 ratio (ref 0.0–5.0)
Cholesterol, Total: 115 mg/dL (ref 100–199)
HDL: 52 mg/dL (ref >39)
LDL Chol Calc (NIH): 47 mg/dL (ref 0–99)
Triglycerides: 80 mg/dL (ref 0–149)
VLDL Cholesterol Cal: 16 mg/dL (ref 5–40)

## 2021-08-27 LAB — TSH: TSH: 3.77 u[IU]/mL (ref 0.450–4.500)

## 2021-08-27 MED ORDER — DOXYCYCLINE HYCLATE 100 MG PO CAPS: 100.0000 mg | ORAL_CAPSULE | Freq: Two times a day (BID) | ORAL | 0 refills | Status: DC

## 2021-08-27 MED ORDER — CEFTRIAXONE SODIUM 500 MG IJ SOLR
500.0000 mg | Freq: Once | INTRAMUSCULAR | Status: AC
  Administered 2021-08-27: 500 mg via INTRAMUSCULAR

## 2021-08-27 MED ORDER — CEFTRIAXONE SODIUM 500 MG IJ SOLR
INTRAMUSCULAR | Status: AC
  Filled 2021-08-27: qty 500

## 2021-08-27 NOTE — Discharge Instructions (Signed)
We gave an injection of antibiotics today that covers for gonorrhea.  Start doxycycline to cover for chlamydia.  This can make you sensitive to the sun so stay out of the sun while on this medication.  We will contact you if we need to arrange any additional treatment based on your swab today.  You should abstain from sex for 7 days after completing medication (a minimum of 2 weeks).  All partners will need to be tested and treated as well if you are positive.

## 2021-08-27 NOTE — ED Triage Notes (Signed)
Pt reports penile discharge for several days. Pt reports dysuria.

## 2021-08-27 NOTE — ED Provider Notes (Signed)
[facility]    CSN: [id number] Arrival date & time: 08/27/21  1002      History   Chief Complaint Chief Complaint  Patient presents with   Penile Discharge    HPI Gregory Espinoza is a 31 y.o. male.   Patient presents today with a several day history of penile discharge.  He reports today he developed some dysuria prompting evaluation.  He does have a history of STI in the past but reports completing treatment and having resolution of symptoms.  Denies any recent antibiotic use.  He is sexually active with male partners.  He does typically use condoms.  Denies any specific exposure.  Denies any urinary frequency, urinary urgency, abdominal pain, pelvic pain, fever.  Denies history of UTI or nephrolithiasis.  Blood pressure is elevated today.  Patient attributes this to anxiety.  Denies any chest pain, shortness of breath, headache, vision change, dizziness.  He does not take any medication.  Denies any recent NSAID, caffeine, decongestants use.  He is followed closely by his primary care provider.    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   Sickle cell anemia (HCC)    Thrombocytopenia (HCC) 12/2018   Vitamin D deficiency 12/2018    Patient Active Problem List   Diagnosis Date Noted   Routine adult health maintenance 08/26/2021   Shortness of breath 05/04/2021   Abdominal pain, right lower quadrant 12/19/2018   Absolute anemia 12/13/2018   Hb-SS disease with acute chest syndrome (HCC) 10/24/2018   Transaminitis 03/13/2018   Sickle cell disease, type SS (HCC) 04/24/2014    Past Surgical History:  Procedure Laterality Date   DENTAL SURGERY         Home Medications    Prior to Admission medications   Medication Sig Start Date End Date Taking? Authorizing Provider  doxycycline (VIBRAMYCIN) 100 MG capsule Take 1 capsule (100 mg total) by mouth 2  (two) times daily. 08/27/21  Yes Shuaib Corsino K, PA-C  albuterol (VENTOLIN HFA) 108 (90 Base) MCG/ACT inhaler Inhale 2 puffs into the lungs every 6 (six) hours as needed for wheezing or shortness of breath. 05/02/21   Ivonne Andrew, NP  apixaban (ELIQUIS) 5 MG TABS tablet Take 1 tablet (5 mg total) by mouth 2 (two) times daily. 07/13/21 08/12/21  Orion Crook I, NP  apixaban (ELIQUIS) 5 MG TABS tablet Take 5 mg by mouth in the morning and at bedtime. 08/19/21 11/17/21  [provider]  APIXABAN Everlene Balls) VTE STARTER PACK (10MG  AND 5MG ) Take 2 tablets (10mg ) by mouth twice daily for 7 days, then take 1 tablet (5 mg) twice daily. 05/26/21   [provider]  Camphor-Menthol-Methyl Sal (SALONPAS DEEP RELIEVING) 3.02-08-13 % GEL Apply 1 patch topically every 8 (eight) hours. Patient not taking: Reported on 08/26/2021 07/12/20   11-24-1969, NP  folic acid (FOLVITE) 1 MG tablet Take 1 mg by mouth daily. 12/05/17   [provider]  hydroxyurea (HYDREA) 500 MG capsule Take 500 mg by mouth daily. May take with food to minimize GI side effects.    [provider]  Multiple Vitamin (ONE-A-DAY 55 PLUS PO) Take 1 tablet by mouth daily.    [provider]  Vitamin D, Ergocalciferol, (DRISDOL) 1.25 MG (50000 UNIT) CAPS capsule Take 1 capsule (50,000 Units total) by mouth every 7 (seven) days. X 12 weeks.  02/06/20   Kallie Locks, FNP    Family History Family History  Problem Relation Age of Onset   Hypertension Mother    Hypertension Father    Sickle cell anemia Brother     Social History Social History   Tobacco Use   Smoking status: Never   Smokeless tobacco: Never  Vaping Use   Vaping Use: Never used  Substance Use Topics   Alcohol use: No    Alcohol/week: 0.0 standard drinks of alcohol   Drug use: No     Allergies   Peanut butter flavor, Sunflower oil, and Prilosec [omeprazole]   Review of Systems Review of Systems  Constitutional:   Negative for activity change, appetite change, fatigue and fever.  Gastrointestinal:  Negative for abdominal pain, diarrhea, nausea and vomiting.  Genitourinary:  Positive for dysuria and penile discharge. Negative for frequency, penile pain and urgency.     Physical Exam Triage Vital Signs ED Triage Vitals [08/27/21 1017]  Enc Vitals Group     BP (!) 164/74     Pulse Rate 75     Resp 18     Temp 98.2 F (36.8 C)     Temp Source Oral     SpO2 98 %     Weight      Height      Head Circumference      Peak Flow      Pain Score      Pain Loc      Pain Edu?      Excl. in GC?    No data found.  Updated Vital Signs BP 118/71 (BP Location: Left Arm)   Pulse 75   Temp 98.2 F (36.8 C) (Oral)   Resp 18   SpO2 98%   Visual Acuity Right Eye Distance:   Left Eye Distance:   Bilateral Distance:    Right Eye Near:   Left Eye Near:    Bilateral Near:     Physical Exam Vitals reviewed.  Constitutional:      General: He is awake.     Appearance: Normal appearance. He is well-developed. He is not ill-appearing.     Comments: Very pleasant male appears stated age in no acute distress sitting comfortably in exam room  HENT:     Head: Normocephalic and atraumatic.     Mouth/Throat:     Pharynx: No oropharyngeal exudate, posterior oropharyngeal erythema or uvula swelling.  Cardiovascular:     Rate and Rhythm: Normal rate and regular rhythm.     Heart sounds: Normal heart sounds, S1 normal and S2 normal. No murmur heard. Pulmonary:     Effort: Pulmonary effort is normal.     Breath sounds: Normal breath sounds. No stridor. No wheezing, rhonchi or rales.     Comments: Clear to auscultation bilaterally Abdominal:     General: Bowel sounds are normal.     Palpations: Abdomen is soft.     Tenderness: There is no abdominal tenderness. There is no right CVA tenderness, left CVA tenderness, guarding or rebound.     Comments: Benign abdominal exam  Neurological:     Mental Status:  He is alert.  Psychiatric:        Behavior: Behavior is cooperative.      UC Treatments / Results  Labs (all labs ordered are listed, but only abnormal results are displayed) Labs Reviewed  POCT URINALYSIS DIPSTICK, ED / UC  CYTOLOGY, (ORAL, ANAL, URETHRAL) ANCILLARY ONLY    EKG  Radiology No results found.  Procedures Procedures (including critical care time)  Medications Ordered in UC Medications  cefTRIAXone (ROCEPHIN) injection 500 mg (500 mg Intramuscular Given 08/27/21 1058)    Initial Impression / Assessment and Plan / UC Course  I have reviewed the triage vital signs and the nursing notes.  Pertinent labs & imaging results that were available during my care of the patient were reviewed by me and considered in my medical decision making (see chart for details).     We will treat for urethritis.  Patient was given Rocephin in clinic and started on doxycycline.  Discussed that he should avoid prolonged sun exposure with this medication due to positivity associated with it.  Discussed the importance of safe sex practices and he was provided a STI education packet with condoms.  Discussed that he will need to abstain from sex for minimum of 7 weeks after completing treatment (minimum of 2 weeks) and that all partners will need to be tested and treated as well.  If anything worsens and he develops worsening symptoms including abdominal pain, pelvic pain, fever, nausea, vomiting he needs to be seen immediately.  Strict return precautions given.  Repeat blood pressure normalized in clinic today.  Final Clinical Impressions(s) / UC Diagnoses   Final diagnoses:  Urethritis  Penile discharge  Dysuria     Discharge Instructions      We gave an injection of antibiotics today that covers for gonorrhea.  Start doxycycline to cover for chlamydia.  This can make you sensitive to the sun so stay out of the sun while on this medication.  We will contact you if we need to  arrange any additional treatment based on your swab today.  You should abstain from sex for 7 days after completing medication (a minimum of 2 weeks).  All partners will need to be tested and treated as well if you are positive.     ED Prescriptions     Medication Sig Dispense Auth. Provider   doxycycline (VIBRAMYCIN) 100 MG capsule Take 1 capsule (100 mg total) by mouth 2 (two) times daily. 14 capsule Gabrial Poppell K, PA-C      PDMP not reviewed this encounter.   Jeani Hawking, PA-C 08/27/21 1107

## 2021-08-29 LAB — CYTOLOGY, (ORAL, ANAL, URETHRAL) ANCILLARY ONLY
Chlamydia: NEGATIVE
Comment: NEGATIVE
Comment: NEGATIVE
Comment: NORMAL
Neisseria Gonorrhea: NEGATIVE
Trichomonas: NEGATIVE

## 2021-08-30 LAB — RPR+HIV+GC+CT PANEL
Chlamydia trachomatis, NAA: NEGATIVE
HIV Screen 4th Generation wRfx: NONREACTIVE
Neisseria Gonorrhoeae by PCR: NEGATIVE
RPR Ser Ql: NONREACTIVE

## 2021-09-15 DIAGNOSIS — I517 Cardiomegaly: Secondary | ICD-10-CM | POA: Diagnosis not present

## 2021-09-15 DIAGNOSIS — R072 Precordial pain: Secondary | ICD-10-CM | POA: Diagnosis not present

## 2021-10-19 DIAGNOSIS — F4321 Adjustment disorder with depressed mood: Secondary | ICD-10-CM | POA: Diagnosis not present

## 2021-11-04 DIAGNOSIS — F4321 Adjustment disorder with depressed mood: Secondary | ICD-10-CM | POA: Diagnosis not present

## 2021-11-11 DIAGNOSIS — F4321 Adjustment disorder with depressed mood: Secondary | ICD-10-CM | POA: Diagnosis not present

## 2021-11-18 DIAGNOSIS — F4321 Adjustment disorder with depressed mood: Secondary | ICD-10-CM | POA: Diagnosis not present

## 2021-11-22 DIAGNOSIS — F4323 Adjustment disorder with mixed anxiety and depressed mood: Secondary | ICD-10-CM | POA: Diagnosis not present

## 2021-11-23 DIAGNOSIS — E559 Vitamin D deficiency, unspecified: Secondary | ICD-10-CM | POA: Diagnosis not present

## 2021-11-23 DIAGNOSIS — I2699 Other pulmonary embolism without acute cor pulmonale: Secondary | ICD-10-CM | POA: Diagnosis not present

## 2021-11-23 DIAGNOSIS — Z86711 Personal history of pulmonary embolism: Secondary | ICD-10-CM | POA: Diagnosis not present

## 2021-11-23 DIAGNOSIS — Z79899 Other long term (current) drug therapy: Secondary | ICD-10-CM | POA: Diagnosis not present

## 2021-11-23 DIAGNOSIS — D571 Sickle-cell disease without crisis: Secondary | ICD-10-CM | POA: Diagnosis not present

## 2021-11-23 DIAGNOSIS — Z7901 Long term (current) use of anticoagulants: Secondary | ICD-10-CM | POA: Diagnosis not present

## 2021-11-25 ENCOUNTER — Encounter: Payer: Self-pay | Admitting: Nurse Practitioner

## 2021-11-25 ENCOUNTER — Ambulatory Visit: Payer: BC Managed Care – PPO | Admitting: Nurse Practitioner

## 2021-11-25 VITALS — BP 128/69 | HR 82 | Temp 98.6°F | Resp 16 | Ht 78.0 in | Wt 213.0 lb

## 2021-11-25 DIAGNOSIS — Z23 Encounter for immunization: Secondary | ICD-10-CM | POA: Diagnosis not present

## 2021-11-25 DIAGNOSIS — F4321 Adjustment disorder with depressed mood: Secondary | ICD-10-CM | POA: Diagnosis not present

## 2021-11-25 DIAGNOSIS — D5700 Hb-SS disease with crisis, unspecified: Secondary | ICD-10-CM | POA: Diagnosis not present

## 2021-11-25 NOTE — Patient Instructions (Addendum)
1. Need for immunization against influenza  - Flu Vaccine QUAD 22mo+IM (Fluarix, Fluzone & Alfiuria Quad PF)  2. Hemoglobin SS disease with crisis ([place])  -Discussed need for good hydration  -Monitor hydration status  -Avoid heat, cold, stress, and infectious triggers  -Discussed the importance of adherence to medication regimen  -Please seek medical attention immediately if any symptoms of bleeding, anemia, infection occurs   Follow up:  Follow up in 6 months or sooner if needed

## 2021-11-25 NOTE — Assessment & Plan Note (Signed)
-   Flu Vaccine QUAD 3mo+IM (Fluarix, Fluzone & Alfiuria Quad PF)  2. Hemoglobin SS disease with crisis ([place])  -Discussed need for good hydration  -Monitor hydration status  -Avoid heat, cold, stress, and infectious triggers  -Discussed the importance of adherence to medication regimen  -Please seek medical attention immediately if any symptoms of bleeding, anemia, infection occurs   Follow up:  Follow up in 6 months or sooner if needed

## 2021-11-25 NOTE — Progress Notes (Signed)
@Patient  ID: Gregory Espinoza, male    DOB: 29-Dec-1990, 31 y.o.   MRN: [id number]  Chief Complaint  Patient presents with   Follow-up    Referring provider: Teena Dunk, NP   HPI  Gregory Espinoza 31 y.o. male  has a past medical history of Erythrocytopenia (12/2018), Hemoglobin S-S disease ([place]), History of PCR DNA positive for HSV1 (10/2019), Sickle cell anemia ([place]), Thrombocytopenia ([place]) (12/2018), and Vitamin D deficiency (12/2018). To the [facility] for reevaluation of shortness of breath.  Patient presents today for a routine follow-up.  Overall patient has been doing well since his last visit here.  Patient does follow with hematology through [facility].  He did have recent labs.  He states that he does not have any new issues or concerns today.  Patient does need a flu vaccine today. Denies f/c/s, n/v/d, hemoptysis, PND, leg swelling Denies chest pain or edema    Allergies  Allergen Reactions   Peanut Butter Flavor Anaphylaxis    Pt stated he has shortness of breathe when eating peanuts and peanut butter   Sunflower Oil Anaphylaxis    Pt stated he has shortness of breathe when eating sunflower seeds and oil   Prilosec [Omeprazole] Other (See Comments)    Pt states he can't breath too well    Immunization History  Administered Date(s) Administered   Influenza,inj,Quad PF,6+ Mos 03/14/2018, 11/07/2018, 11/25/2021   Influenza,inj,Quad PF,6-35 Mos 11/10/2019   Influenza-Unspecified 03/14/2018   Meningococcal Conjugate 04/27/2014   Pneumococcal Conjugate-13 04/27/2014   Pneumococcal Polysaccharide-23 03/14/2018   [place]abies, IM 10/04/2020, 10/07/2020, 10/11/2020, 10/22/2020    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[place])    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   apixaban (ELIQUIS) 2.5 MG TABS tablet Take 2.5 mg by mouth 2 (two) times daily.   folic acid (FOLVITE) 1 MG tablet Take 1 mg by mouth daily.   hydroxyurea (HYDREA) 500 MG capsule Take 500 mg by mouth daily. May take with food to minimize GI side effects.   Multiple Vitamin (ONE-A-DAY 55 PLUS PO) Take 1 tablet by mouth daily.   Vitamin D, Ergocalciferol, (DRISDOL) 1.25 MG (50000 UNIT) CAPS capsule Take 1 capsule (50,000 Units total) by mouth every 7 (seven) days. X 12 weeks.   apixaban (ELIQUIS) 5 MG TABS tablet Take 1 tablet (5 mg total) by mouth 2 (two) times daily. (Patient not taking: Reported on 11/25/2021)   apixaban (ELIQUIS) 5 MG TABS tablet Take 5 mg by mouth in the morning and at bedtime. (Patient not taking: Reported on 11/25/2021)   APIXABAN (ELIQUIS) VTE STARTER PACK (10MG  AND 5MG ) Take 2 tablets (10mg ) by mouth twice daily for 7 days, then take 1 tablet (5 mg) twice daily. (Patient not taking: Reported on 11/25/2021)   [DISCONTINUED] Camphor-Menthol-Methyl Sal (SALONPAS DEEP RELIEVING) 3.02-08-13 % GEL Apply 1 patch topically every 8 (eight) hours. (Patient not taking: Reported on 08/26/2021)   [DISCONTINUED] doxycycline (VIBRAMYCIN)  100 MG capsule Take 1 capsule (100 mg total) by mouth 2 (two) times daily.   No facility-administered encounter medications on file as of 11/25/2021.     Review of Systems  Review of Systems  Constitutional: Negative.   HENT: Negative.    Cardiovascular: Negative.   Gastrointestinal: Negative.   Allergic/Immunologic: Negative.   Neurological: Negative.   Psychiatric/Behavioral: Negative.         Physical Exam  BP 128/69 (BP Location:  Left Arm, Patient Position: Sitting, Cuff Size: Normal)   Pulse 82   Temp 98.6 F (37 C) (Oral)   Resp 16   Ht 6\' 6"  (1.981 m)   Wt 213 lb (96.6 kg)   SpO2 97%   BMI 24.61 kg/m   Wt Readings from Last 5 Encounters:  11/25/21 213 lb (96.6 kg)  08/26/21 212 lb 8 oz (96.4 kg)  05/30/21 210 lb 6.4 oz (95.4 kg)  05/27/21 207 lb 3.2 oz (94 kg)  05/05/21 206 lb 6.4 oz (93.6 kg)     Physical Exam Vitals and nursing note reviewed.  Constitutional:      General: He is not in acute distress.    Appearance: He is well-developed.  Cardiovascular:     Rate and Rhythm: Normal rate and regular rhythm.  Pulmonary:     Effort: Pulmonary effort is normal.     Breath sounds: Normal breath sounds.  Skin:    General: Skin is warm and dry.  Neurological:     Mental Status: He is alert and oriented to person, place, and time.       Assessment & Plan:   Need for immunization against influenza - Flu Vaccine QUAD 16mo+IM (Fluarix, Fluzone & Alfiuria Quad PF)  2. Hemoglobin SS disease with crisis ([place])  -Discussed need for good hydration  -Monitor hydration status  -Avoid heat, cold, stress, and infectious triggers  -Discussed the importance of adherence to medication regimen  -Please seek medical attention immediately if any symptoms of bleeding, anemia, infection occurs   Follow up:  Follow up in 6 months or sooner if needed     Fenton Foy, NP 11/25/2021

## 2021-12-02 DIAGNOSIS — F4321 Adjustment disorder with depressed mood: Secondary | ICD-10-CM | POA: Diagnosis not present

## 2021-12-05 DIAGNOSIS — F4323 Adjustment disorder with mixed anxiety and depressed mood: Secondary | ICD-10-CM | POA: Diagnosis not present

## 2021-12-14 DIAGNOSIS — F4323 Adjustment disorder with mixed anxiety and depressed mood: Secondary | ICD-10-CM | POA: Diagnosis not present

## 2021-12-21 DIAGNOSIS — F4321 Adjustment disorder with depressed mood: Secondary | ICD-10-CM | POA: Diagnosis not present

## 2021-12-26 DIAGNOSIS — F4323 Adjustment disorder with mixed anxiety and depressed mood: Secondary | ICD-10-CM | POA: Diagnosis not present

## 2022-01-05 DIAGNOSIS — F4323 Adjustment disorder with mixed anxiety and depressed mood: Secondary | ICD-10-CM | POA: Diagnosis not present

## 2022-01-16 DIAGNOSIS — F4323 Adjustment disorder with mixed anxiety and depressed mood: Secondary | ICD-10-CM | POA: Diagnosis not present

## 2022-01-27 DIAGNOSIS — F4321 Adjustment disorder with depressed mood: Secondary | ICD-10-CM | POA: Diagnosis not present

## 2022-02-02 ENCOUNTER — Ambulatory Visit: Payer: Self-pay | Admitting: Nurse Practitioner

## 2022-02-03 DIAGNOSIS — F4321 Adjustment disorder with depressed mood: Secondary | ICD-10-CM | POA: Diagnosis not present

## 2022-02-09 ENCOUNTER — Ambulatory Visit: Payer: BC Managed Care – PPO | Admitting: Nurse Practitioner

## 2022-02-09 ENCOUNTER — Encounter: Payer: Self-pay | Admitting: Nurse Practitioner

## 2022-02-09 VITALS — BP 114/53 | HR 86 | Temp 98.1°F | Ht 78.0 in | Wt 217.4 lb

## 2022-02-09 DIAGNOSIS — R531 Weakness: Secondary | ICD-10-CM | POA: Diagnosis not present

## 2022-02-09 DIAGNOSIS — Z113 Encounter for screening for infections with a predominantly sexual mode of transmission: Secondary | ICD-10-CM | POA: Diagnosis not present

## 2022-02-09 DIAGNOSIS — R5383 Other fatigue: Secondary | ICD-10-CM | POA: Diagnosis not present

## 2022-02-09 NOTE — Progress Notes (Signed)
@Patient  ID: Gregory Espinoza, male    DOB: 10/26/1990, 32 y.o.   MRN: [id number]  Chief Complaint  Patient presents with   Fatigue    With pain in both legs and left lower back    Referring provider: Teena Dunk, NP   HPI  Gregory Espinoza 32 y.o. 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    Patient presents today for an acute visit.  He states for the past week or so he has been feeling fatigued, weak and having pain in lower extremities and lower back.  We discussed that we will get blood work today and patient can return in the morning to be triaged in the day hospital.  Patient does need to stay well-hydrated and pain medicine was offered but patient declines any pain medicine at this time. Denies f/c/s, n/v/d, hemoptysis, PND, leg swelling Denies chest pain or edema   Patient is requesting STD screening   Allergies  Allergen Reactions   Peanut Butter Flavor Anaphylaxis    Pt stated he has shortness of breathe when eating peanuts and peanut butter   Sunflower Oil Anaphylaxis    Pt stated he has shortness of breathe when eating sunflower seeds and oil   Prilosec [Omeprazole] Other (See Comments)    Pt states he can't breath too well    Immunization History  Administered Date(s) Administered   Influenza,inj,Quad PF,6+ Mos 03/14/2018, 11/07/2018, 11/10/2020, 11/25/2021   Influenza,inj,Quad PF,6-35 Mos 11/10/2019   Influenza-Unspecified 03/14/2018   Meningococcal Conjugate 04/27/2014   Pneumococcal Conjugate-13 04/27/2014   Pneumococcal Polysaccharide-23 03/14/2018   [place], IM 10/04/2020, 10/07/2020, 10/11/2020, 10/22/2020    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   apixaban (ELIQUIS) 2.5 MG TABS tablet Take 2.5 mg by mouth 2 (two) times daily.   folic acid (FOLVITE) 1 MG tablet Take 1 mg by mouth daily.   hydroxyurea (HYDREA) 500 MG capsule Take 500 mg by mouth daily. May take with food to minimize GI side effects.   Multiple Vitamin (ONE-A-DAY 55 PLUS PO) Take 1 tablet by mouth daily.   Vitamin D, Ergocalciferol, (DRISDOL) 1.25 MG (50000 UNIT) CAPS capsule Take 1 capsule (50,000 Units total) by mouth every 7 (seven) days. X 12 weeks. (Patient taking differently: Take 50,000 Units by mouth every 30 (thirty) days. X 12 weeks.)   [DISCONTINUED] apixaban (ELIQUIS) 5 MG TABS tablet Take 1 tablet (5 mg total) by mouth 2 (two) times daily. (Patient not taking: Reported on 11/25/2021)   [DISCONTINUED] apixaban (ELIQUIS) 5 MG TABS tablet Take 5 mg by mouth in the morning and at bedtime. (Patient not taking: Reported on 11/25/2021)   [DISCONTINUED] APIXABAN (ELIQUIS) VTE STARTER PACK (10MG  AND 5MG ) Take 2 tablets (10mg ) by  mouth twice daily for 7 days, then take 1 tablet (5 mg) twice daily. (Patient not taking: Reported on 11/25/2021)   No facility-administered encounter medications on file as of 02/09/2022.     Review of Systems  Review of Systems  Constitutional:  Positive for fatigue.  HENT: Negative.    Cardiovascular: Negative.   Gastrointestinal: Negative.   Musculoskeletal:  Positive for arthralgias and myalgias.  Allergic/Immunologic: Negative.   Neurological: Negative.    Psychiatric/Behavioral: Negative.         Physical Exam  BP (!) 114/53   Pulse 86   Temp 98.1 F (36.7 C)   Ht 6\' 6"  (1.981 m)   Wt 217 lb 6.4 oz (98.6 kg)   SpO2 99%   BMI 25.12 kg/m   Wt Readings from Last 5 Encounters:  02/09/22 217 lb 6.4 oz (98.6 kg)  11/25/21 213 lb (96.6 kg)  08/26/21 212 lb 8 oz (96.4 kg)  05/30/21 210 lb 6.4 oz (95.4 kg)  05/27/21 207 lb 3.2 oz (94 kg)     Physical Exam Vitals and nursing note reviewed.  Constitutional:      General: He is not in acute distress.    Appearance: He is well-developed.  Cardiovascular:     Rate and Rhythm: Normal rate and regular rhythm.  Pulmonary:     Effort: Pulmonary effort is normal.     Breath sounds: Normal breath sounds.  Skin:    General: Skin is warm and dry.  Neurological:     Mental Status: He is alert and oriented to person, place, and time.      Lab Results:  CBC    Component Value Date/Time   WBC 8.3 02/09/2022 1601   WBC 9.0 04/25/2021 1627   RBC 2.46 (LL) 02/09/2022 1601   RBC 2.68 (L) 04/25/2021 1627   HGB 7.9 (L) 02/09/2022 1601   HCT 23.7 (L) 02/09/2022 1601   PLT 212 02/09/2022 1601   MCV 96 02/09/2022 1601   MCH 32.1 02/09/2022 1601   MCH 34.3 (H) 04/25/2021 1627   MCHC 33.3 02/09/2022 1601   MCHC 36.4 (H) 04/25/2021 1627   RDW 19.4 (H) 02/09/2022 1601   LYMPHSABS 2.4 05/05/2021 1505   MONOABS 0.6 09/04/2020 0047   EOSABS 0.2 05/05/2021 1505   BASOSABS 0.1 05/05/2021 1505    BMET    Component Value Date/Time   NA 138 02/09/2022 1601   K 4.0 02/09/2022 1601   CL 103 02/09/2022 1601   CO2 21 02/09/2022 1601   GLUCOSE 92 02/09/2022 1601   GLUCOSE 91 04/25/2021 1627   BUN 7 02/09/2022 1601   CREATININE 0.76 02/09/2022 1601   CREATININE 0.70 04/07/2014 1823   CALCIUM 8.8 02/09/2022 1601   GFRNONAA >60 04/25/2021 1627   GFRAA 135 02/03/2020 0935    BNP No results found for: "BNP"  ProBNP No results found for: "PROBNP"  Imaging: No results  found.   Assessment & Plan:   Screening examination for STD (sexually transmitted disease) - RPR+HIV+GC+CT Panel  2. Weakness  - CBC - Comprehensive metabolic panel  3. Other fatigue  - CBC - Comprehensive metabolic panel  Follow up:  Follow up as scheduled     Fenton Foy, NP 02/10/2022

## 2022-02-09 NOTE — Patient Instructions (Signed)
1. Screening examination for STD (sexually transmitted disease)  - RPR+HIV+GC+CT Panel  2. Weakness  - CBC - Comprehensive metabolic panel  3. Other fatigue  - CBC - Comprehensive metabolic panel  Follow up:  Follow up as scheduled

## 2022-02-10 ENCOUNTER — Non-Acute Institutional Stay (HOSPITAL_COMMUNITY)
Admission: RE | Admit: 2022-02-10 | Discharge: 2022-02-10 | Disposition: A | Discharge status: Home / Self Care | Payer: BC Managed Care – PPO | Source: Ambulatory Visit | Attending: Internal Medicine | Admitting: Internal Medicine

## 2022-02-10 ENCOUNTER — Non-Acute Institutional Stay (HOSPITAL_COMMUNITY)
Admission: AD | Admit: 2022-02-10 | Discharge: 2022-02-10 | Disposition: A | Discharge status: Still In Hospital | Payer: BC Managed Care – PPO | Source: Ambulatory Visit | Attending: Internal Medicine | Admitting: Internal Medicine

## 2022-02-10 ENCOUNTER — Encounter: Payer: Self-pay | Admitting: Nurse Practitioner

## 2022-02-10 VITALS — BP 125/62 | HR 72 | Temp 97.8°F

## 2022-02-10 DIAGNOSIS — D5700 Hb-SS disease with crisis, unspecified: Secondary | ICD-10-CM | POA: Diagnosis not present

## 2022-02-10 DIAGNOSIS — Z113 Encounter for screening for infections with a predominantly sexual mode of transmission: Secondary | ICD-10-CM | POA: Insufficient documentation

## 2022-02-10 LAB — CBC
HCT: 23.4 % — ABNORMAL LOW (ref 39.0–52.0)
Hematocrit: 23.7 % — ABNORMAL LOW (ref 37.5–51.0)
Hemoglobin: 7.9 g/dL — ABNORMAL LOW (ref 13.0–17.7)
Hemoglobin: 7.8 g/dL — ABNORMAL LOW (ref 13.0–17.0)
MCH: 32.1 pg (ref 26.6–33.0)
MCH: 31.8 pg (ref 26.0–34.0)
MCHC: 33.3 g/dL (ref 31.5–35.7)
MCHC: 33.3 g/dL (ref 30.0–36.0)
MCV: 96 fL (ref 79–97)
MCV: 95.5 fL (ref 80.0–100.0)
NRBC: 1 % — ABNORMAL HIGH (ref 0–0)
Platelets: 212 10*3/uL (ref 150–450)
Platelets: 193 10*3/uL (ref 150–400)
RBC: 2.46 x10E6/uL — CL (ref 4.14–5.80)
RBC: 2.45 MIL/uL — ABNORMAL LOW (ref 4.22–5.81)
RDW: 19.4 % — ABNORMAL HIGH (ref 11.6–15.4)
RDW: 19.9 % — ABNORMAL HIGH (ref 11.5–15.5)
WBC: 8.3 10*3/uL (ref 3.4–10.8)
WBC: 8.1 10*3/uL (ref 4.0–10.5)
nRBC: 1.1 % — ABNORMAL HIGH (ref 0.0–0.2)

## 2022-02-10 LAB — COMPREHENSIVE METABOLIC PANEL
ALT: 16 IU/L (ref 0–44)
AST: 23 IU/L (ref 0–40)
Albumin/Globulin Ratio: 1.5 (ref 1.2–2.2)
Albumin: 4.5 g/dL (ref 4.3–5.2)
Alkaline Phosphatase: 42 IU/L — ABNORMAL LOW (ref 44–121)
BUN/Creatinine Ratio: 9 (ref 9–20)
BUN: 7 mg/dL (ref 6–20)
Bilirubin Total: 4.2 mg/dL — ABNORMAL HIGH (ref 0.0–1.2)
CO2: 21 mmol/L (ref 20–29)
Calcium: 8.8 mg/dL (ref 8.7–10.2)
Chloride: 103 mmol/L (ref 96–106)
Creatinine, Ser: 0.76 mg/dL (ref 0.76–1.27)
Globulin, Total: 3 g/dL (ref 1.5–4.5)
Glucose: 92 mg/dL (ref 70–99)
Potassium: 4 mmol/L (ref 3.5–5.2)
Sodium: 138 mmol/L (ref 134–144)
Total Protein: 7.5 g/dL (ref 6.0–8.5)
eGFR: 124 mL/min/{1.73_m2} (ref >59)

## 2022-02-10 MED ORDER — SODIUM CHLORIDE 0.45 % IV BOLUS
1000.0000 mL | Freq: Once | INTRAVENOUS | Status: AC
  Administered 2022-02-10: 1000 mL via INTRAVENOUS

## 2022-02-10 NOTE — Progress Notes (Signed)
Pt showed up in lobby of [facility]. RN triaged pt. Pt states he is having no pain, but has weakness in bilateral legs and has been experiencing fatigue. Pt states that he has not taken any pain medication. Pt denies being to the Emergency Room. Pt denies fever, chest pain, N/V/D, priapism and abdominal pain. Pt states that he sometimes has intermittent chest pain that has been ongoing for awhile and he previously had an echo done regarding this issue per pt. [place] Holl[place], [facility] notified. Provider said that pt can be added to infusion schedule to receive fluids today. Pt notified and verbalized understanding.

## 2022-02-10 NOTE — Progress Notes (Signed)
Pt seen today to receive IV fluids. Ordered labs (CBC) drawn peripherally on pt.  Pt received 1,000 mL 0.45% Sodium Chloride fluid bolus via PIV. Pt tolerated infusion with no adverse reaction, Vital signs stable. After fluids were complete, pt stated that he feels more fatigued then before. Gregory Espinoza, [place] notified. Pt observed for one hour post fluid bolus. Vitals remained stable. Pt said his drowsiness has gotten better, and is fine to leave. AVS given to pt. At discharge, pt is awake, oriented, and ambulatory.

## 2022-02-10 NOTE — Assessment & Plan Note (Signed)
-  RPR+HIV+GC+CT Panel  2. Weakness  - CBC - Comprehensive metabolic panel  3. Other fatigue  - CBC - Comprehensive metabolic panel  Follow up:  Follow up as scheduled

## 2022-02-11 LAB — RPR+HIV+GC+CT PANEL
Chlamydia trachomatis, NAA: NEGATIVE
HIV Screen 4th Generation wRfx: NONREACTIVE
Neisseria Gonorrhoeae by PCR: NEGATIVE
RPR Ser Ql: NONREACTIVE

## 2022-02-17 DIAGNOSIS — R0602 Shortness of breath: Secondary | ICD-10-CM | POA: Diagnosis not present

## 2022-02-17 DIAGNOSIS — F4321 Adjustment disorder with depressed mood: Secondary | ICD-10-CM | POA: Diagnosis not present

## 2022-02-17 DIAGNOSIS — R42 Dizziness and giddiness: Secondary | ICD-10-CM | POA: Diagnosis not present

## 2022-02-17 DIAGNOSIS — D571 Sickle-cell disease without crisis: Secondary | ICD-10-CM | POA: Diagnosis not present

## 2022-02-20 ENCOUNTER — Ambulatory Visit
Admission: RE | Admit: 2022-02-20 | Discharge: 2022-02-20 | Disposition: A | Discharge status: Home / Self Care | Payer: BC Managed Care – PPO | Source: Ambulatory Visit | Attending: Nurse Practitioner | Admitting: Nurse Practitioner

## 2022-02-20 VITALS — BP 124/79 | HR 79 | Temp 97.9°F | Resp 16

## 2022-02-20 DIAGNOSIS — U071 COVID-19: Secondary | ICD-10-CM | POA: Diagnosis not present

## 2022-02-20 DIAGNOSIS — Z113 Encounter for screening for infections with a predominantly sexual mode of transmission: Secondary | ICD-10-CM | POA: Diagnosis not present

## 2022-02-20 DIAGNOSIS — B349 Viral infection, unspecified: Secondary | ICD-10-CM | POA: Diagnosis not present

## 2022-02-20 NOTE — Discharge Instructions (Signed)
Your symptoms and exam are consistent for a viral illness. Please treat your symptoms with over the counter cough medication, tylenol or ibuprofen, humidifier, and rest. Viral illnesses can last 7-14 days. Please follow up with your PCP if your symptoms are not improving. Please go to the ER for any worsening symptoms. This includes but is not limited to fever you can not control with tylenol or ibuprofen, you are not able to stay hydrated, you have shortness of breath or chest pain.   Clinical contact you for any positive results of the testing done today Follow-up with your PCP for further evaluation of your chronic fatigue  Thank you for choosing [facility] for your healthcare needs. I hope you feel better soon!

## 2022-02-20 NOTE — ED Provider Notes (Signed)
[facility]    CSN: [id number] Arrival date & time: 02/20/22  1144      History   Chief Complaint Chief Complaint  Patient presents with   SEXUALLY TRANSMITTED DISEASE    Would like to get tested for it - Entered by patient   Fatigue   Nasal Congestion    HPI Gregory Espinoza is a 32 y.o. male who presents for evaluation of URI symptoms for 3 days. Patient reports associated symptoms of cough, congestion, fatigue. Denies N/V/D, fevers, chills, body aches, sore throat, ear pain, shortness of breath. Patient does not have a hx of asthma or smoking.  Does have a history of sickle cell anemia.  No known sick contacts and no recent travel. Pt is vaccinated for COVID. Pt is vaccinated for flu this season. Pt has taken nothing OTC for symptoms. She reports chronic fatigue for which she seen his PCP for and had blood work.  Does state he has fatigue with his sickle cell anemia but denies being in any exacerbation or crisis. He would like STD screening.  He denies any current symptoms including penile discharge, testicular pain or swelling, dysuria.  States he had a negative STD screening 2 weeks ago but "wants to be sure".  Denies any known STD exposure.  In addition he reports a chafing scar on his penis for several weeks that has not changed, worsened.  Denies any lesions, vesicles, penile drainage. Pt has no other concerns at this time.   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)    History of PCR DNA positive for HSV1 10/2019   Sickle cell anemia ([facility])    Thrombocytopenia ([place]) 12/2018   Vitamin D deficiency 12/2018    Patient Active Problem List   Diagnosis Date Noted   Screening examination for STD (sexually transmitted disease) 02/10/2022   Routine adult health maintenance 08/26/2021   Shortness of breath 05/04/2021   Abdominal pain, right lower quadrant 12/19/2018   Absolute anemia 12/13/2018   Hb-SS  disease with acute chest syndrome ([place]) 10/24/2018   Transaminitis 03/13/2018   Sickle cell disease, type SS ([place]) 04/24/2014    Past Surgical History:  Procedure Laterality Date   DENTAL SURGERY         Home Medications    Prior to Admission medications   Medication Sig Start Date End Date Taking? Authorizing Provider  albuterol (VENTOLIN HFA) 108 (90 Base) MCG/ACT inhaler Inhale 2 puffs into the lungs every 6 (six) hours as needed for wheezing or shortness of breath. 05/02/21   Fenton Foy, NP  apixaban (ELIQUIS) 2.5 MG TABS tablet Take 2.5 mg by mouth 2 (two) times daily.    [provider]  folic acid (FOLVITE) 1 MG tablet Take 1 mg by mouth daily. 12/05/17   [provider]  hydroxyurea (HYDREA) 500 MG capsule Take 500 mg by mouth daily. May take with food to minimize GI side effects.    [provider]  Multiple Vitamin (ONE-A-DAY 55 PLUS PO) Take 1 tablet by mouth daily.    [provider]  Vitamin D, Ergocalciferol, (DRISDOL) 1.25 MG (50000 UNIT) CAPS capsule Take 1 capsule (50,000 Units total) by mouth every 7 (seven) days. X 12 weeks. Patient taking differently: Take 50,000 Units by mouth every 30 (thirty) days. X 12 weeks. 02/06/20   Azzie Glatter, FNP    Family History Family History  Problem Relation Age  of Onset   Hypertension Mother    Hypertension Father    Sickle cell anemia Brother     Social History Social History   Tobacco Use   Smoking status: Never   Smokeless tobacco: Never  Vaping Use   Vaping Use: Never used  Substance Use Topics   Alcohol use: No    Alcohol/week: 0.0 standard drinks of alcohol   Drug use: No     Allergies   Peanut butter flavor, Sunflower oil, and Prilosec [omeprazole]   Review of Systems Review of Systems  Constitutional:  Positive for fatigue.  HENT:  Positive for congestion.   Respiratory:  Positive for cough.   Genitourinary:        Penile scar     Physical  Exam Triage Vital Signs ED Triage Vitals  Enc Vitals Group     BP 02/20/22 1156 124/79     Pulse Rate 02/20/22 1156 79     Resp 02/20/22 1156 16     Temp 02/20/22 1156 97.9 F (36.6 C)     Temp Source 02/20/22 1156 Oral     SpO2 02/20/22 1156 97 %     Weight --      Height --      Head Circumference --      Peak Flow --      Pain Score 02/20/22 1155 0     Pain Loc --      Pain Edu? --      Excl. in GC? --    No data found.  Updated Vital Signs BP 124/79 (BP Location: Right Arm)   Pulse 79   Temp 97.9 F (36.6 C) (Oral)   Resp 16   SpO2 97%   Visual Acuity Right Eye Distance:   Left Eye Distance:   Bilateral Distance:    Right Eye Near:   Left Eye Near:    Bilateral Near:     Physical Exam Vitals and nursing note reviewed.  Constitutional:      General: He is not in acute distress.    Appearance: Normal appearance. He is not ill-appearing or toxic-appearing.  HENT:     Head: Normocephalic and atraumatic.     Right Ear: Tympanic membrane and ear canal normal.     Left Ear: Tympanic membrane and ear canal normal.     Nose: Congestion present.     Mouth/Throat:     Mouth: Mucous membranes are moist.     Pharynx: No oropharyngeal exudate or posterior oropharyngeal erythema.  Eyes:     Pupils: Pupils are equal, round, and reactive to light.  Cardiovascular:     Rate and Rhythm: Normal rate and regular rhythm.     Heart sounds: Normal heart sounds.  Pulmonary:     Effort: Pulmonary effort is normal.     Breath sounds: Normal breath sounds.  Genitourinary:    Comments: Declined penile exam Musculoskeletal:     Cervical back: Normal range of motion and neck supple.  Lymphadenopathy:     Cervical: No cervical adenopathy.  Skin:    General: Skin is warm and dry.  Neurological:     General: No focal deficit present.     Mental Status: He is alert and oriented to person, place, and time.  Psychiatric:        Mood and Affect: Mood normal.        Behavior:  Behavior normal.      UC Treatments / Results  Labs (all labs ordered  are listed, but only abnormal results are displayed) Labs Reviewed  SARS CORONAVIRUS 2 (TAT 6-24 HRS)  RPR  HIV ANTIBODY (ROUTINE TESTING W REFLEX)  CYTOLOGY, (ORAL, ANAL, URETHRAL) ANCILLARY ONLY    EKG   Radiology No results found.  Procedures Procedures (including critical care time)  Medications Ordered in UC Medications - No data to display  Initial Impression / Assessment and Plan / UC Course  I have reviewed the triage vital signs and the nursing notes.  Pertinent labs & imaging results that were available during my care of the patient were reviewed by me and considered in my medical decision making (see chart for details).     Reviewed exam and symptoms with patient.  No red flags on exam. COVID PCR and will contact with results.  Discussed viral illness and symptomatic treatment with OTC cough medicine Patient with chronic fatigue that is already had workup with his PCP.  Advised him to follow-up with his PCP for further workup of this. He declined penile exam for "chafing scar".  Advised for him to see his PCP for further concerns regarding this or if it worsens STD testing as ordered Rest and fluids Follow-up with PCP in 2 to 3 days for recheck ER precautions reviewed and patient verbalized understanding Final Clinical Impressions(s) / UC Diagnoses   Final diagnoses:  Screening examination for STD (sexually transmitted disease)  Viral illness     Discharge Instructions      Your symptoms and exam are consistent for a viral illness. Please treat your symptoms with over the counter cough medication, tylenol or ibuprofen, humidifier, and rest. Viral illnesses can last 7-14 days. Please follow up with your PCP if your symptoms are not improving. Please go to the ER for any worsening symptoms. This includes but is not limited to fever you can not control with tylenol or ibuprofen, you are  not able to stay hydrated, you have shortness of breath or chest pain.   Clinical contact you for any positive results of the testing done today Follow-up with your PCP for further evaluation of your chronic fatigue  Thank you for choosing [facility] for your healthcare needs. I hope you feel better soon!    ED Prescriptions   None    PDMP not reviewed this encounter.   Melynda Ripple, NP 02/20/22 [phone]

## 2022-02-20 NOTE — ED Triage Notes (Addendum)
Pt c/o fatigue (started in December), weakness, and congestion (started a few days ago). Home interventions: dayquil   Patient reports he has a sore to his penis that has been there since December.  The patient is requesting testing for covid and STDs (including blood work).

## 2022-02-21 LAB — CYTOLOGY, (ORAL, ANAL, URETHRAL) ANCILLARY ONLY
Chlamydia: NEGATIVE
Comment: NEGATIVE
Comment: NEGATIVE
Comment: NORMAL
Neisseria Gonorrhea: NEGATIVE
Trichomonas: NEGATIVE

## 2022-02-21 LAB — RPR: RPR Ser Ql: NONREACTIVE

## 2022-02-21 LAB — SARS CORONAVIRUS 2 (TAT 6-24 HRS): SARS Coronavirus 2: POSITIVE — AB

## 2022-02-21 LAB — HIV ANTIBODY (ROUTINE TESTING W REFLEX): HIV Screen 4th Generation wRfx: NONREACTIVE

## 2022-02-22 DIAGNOSIS — D571 Sickle-cell disease without crisis: Secondary | ICD-10-CM | POA: Diagnosis not present

## 2022-03-13 NOTE — Telephone Encounter (Signed)
Could you please make a letter for this patient for this and I will sign it. Thanks.

## 2022-03-27 DIAGNOSIS — F4321 Adjustment disorder with depressed mood: Secondary | ICD-10-CM | POA: Diagnosis not present

## 2022-03-29 DIAGNOSIS — F4321 Adjustment disorder with depressed mood: Secondary | ICD-10-CM | POA: Diagnosis not present

## 2022-03-31 ENCOUNTER — Encounter: Payer: Self-pay | Admitting: Nurse Practitioner

## 2022-03-31 ENCOUNTER — Ambulatory Visit: Payer: BC Managed Care – PPO | Admitting: Nurse Practitioner

## 2022-03-31 ENCOUNTER — Ambulatory Visit (HOSPITAL_COMMUNITY)
Admission: RE | Admit: 2022-03-31 | Discharge: 2022-03-31 | Disposition: A | Discharge status: Home / Self Care | Payer: BC Managed Care – PPO | Source: Ambulatory Visit | Attending: Nurse Practitioner | Admitting: Nurse Practitioner

## 2022-03-31 VITALS — BP 126/57 | HR 77 | Temp 97.9°F | Ht 78.0 in | Wt 219.2 lb

## 2022-03-31 DIAGNOSIS — R053 Chronic cough: Secondary | ICD-10-CM | POA: Insufficient documentation

## 2022-03-31 DIAGNOSIS — D571 Sickle-cell disease without crisis: Secondary | ICD-10-CM

## 2022-03-31 DIAGNOSIS — Z8616 Personal history of COVID-19: Secondary | ICD-10-CM

## 2022-03-31 DIAGNOSIS — R079 Chest pain, unspecified: Secondary | ICD-10-CM | POA: Diagnosis not present

## 2022-03-31 MED ORDER — AZITHROMYCIN 250 MG PO TABS: ORAL_TABLET | ORAL | 0 refills | Status: AC

## 2022-03-31 MED ORDER — ALBUTEROL SULFATE HFA 108 (90 BASE) MCG/ACT IN AERS: 2.0000 | INHALATION_SPRAY | Freq: Four times a day (QID) | RESPIRATORY_TRACT | 0 refills | Status: AC | PRN

## 2022-03-31 MED ORDER — BENZONATATE 200 MG PO CAPS: 200.0000 mg | ORAL_CAPSULE | Freq: Two times a day (BID) | ORAL | 0 refills | Status: DC | PRN

## 2022-03-31 MED ORDER — PREDNISONE 20 MG PO TABS: 20.0000 mg | ORAL_TABLET | Freq: Every day | ORAL | 0 refills | Status: AC

## 2022-03-31 NOTE — Patient Instructions (Signed)
1. History of COVID-19  - DG Chest 2 View - azithromycin (ZITHROMAX) 250 MG tablet; Take 2 tablets on day 1, then 1 tablet daily on days 2 through 5  Dispense: 6 tablet; Refill: 0 - predniSONE (DELTASONE) 20 MG tablet; Take 1 tablet (20 mg total) by mouth daily with breakfast for 5 days.  Dispense: 5 tablet; Refill: 0  2. Chronic cough  - DG Chest 2 View - azithromycin (ZITHROMAX) 250 MG tablet; Take 2 tablets on day 1, then 1 tablet daily on days 2 through 5  Dispense: 6 tablet; Refill: 0 - predniSONE (DELTASONE) 20 MG tablet; Take 1 tablet (20 mg total) by mouth daily with breakfast for 5 days.  Dispense: 5 tablet; Refill: 0 - benzonatate (TESSALON) 200 MG capsule; Take 1 capsule (200 mg total) by mouth 2 (two) times daily as needed for cough.  Dispense: 20 capsule; Refill: 0  Follow up:  Follow up in 3 months

## 2022-03-31 NOTE — Progress Notes (Signed)
@Patient  ID: Gregory Espinoza, male    DOB: 1990-11-13, 32 y.o.   MRN: [id number]  Chief Complaint  Patient presents with   Cough    Over one month post covid pt     Referring provider: Fenton Foy, NP   HPI  Gregory Espinoza 32 y.o. 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      Patient presents today for chronic cough.  He states that he has had a cough for over a month now since having COVID.  We will order a chest x-ray.  We will trial azithromycin and prednisone. Denies f/c/s, n/v/d, hemoptysis, PND, leg swelling Denies chest pain or edema     Allergies  Allergen Reactions   Peanut Butter Flavor Anaphylaxis    Pt stated he has shortness of breathe when eating peanuts and peanut butter   Sunflower Oil Anaphylaxis    Pt stated he has shortness of breathe when eating sunflower seeds and oil   Prilosec [Omeprazole] Other (See Comments)    Pt states he can't breath too well    Immunization History  Administered Date(s) Administered   Influenza,inj,Quad PF,6+ Mos 03/14/2018, 11/07/2018, 11/10/2020, 11/25/2021   Influenza,inj,Quad PF,6-35 Mos 11/10/2019   Influenza-Unspecified 03/14/2018   Meningococcal Conjugate 04/27/2014   Pneumococcal Conjugate-13 04/27/2014   Pneumococcal Polysaccharide-23 03/14/2018   [place], IM 10/04/2020, 10/07/2020, 10/11/2020, 10/22/2020    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   albuterol (VENTOLIN HFA) 108 (90 Base) MCG/ACT inhaler Inhale 2 puffs into the lungs every 6 (six) hours as needed for wheezing or shortness of breath.   apixaban (ELIQUIS) 2.5 MG TABS tablet Take 2.5 mg by mouth 2 (two) times daily.   [EXPIRED] azithromycin (ZITHROMAX) 250 MG tablet Take 2 tablets on day 1, then 1 tablet daily on days 2 through 5   benzonatate (TESSALON) 200 MG capsule Take 1 capsule (200 mg total) by mouth 2 (two) times daily as needed for cough.   folic acid (FOLVITE) 1 MG tablet Take 1 mg by mouth daily.   hydroxyurea (HYDREA) 500 MG capsule Take 500 mg by mouth daily. May take with food to minimize GI side effects.   Multiple Vitamin (ONE-A-DAY 55 PLUS PO) Take 1 tablet by mouth daily.   [EXPIRED] predniSONE (DELTASONE) 20 MG tablet Take 1 tablet (20 mg total) by mouth daily with breakfast for 5 days.   Vitamin D, Ergocalciferol, (DRISDOL) 1.25 MG (50000 UNIT) CAPS capsule Take 1 capsule (50,000 Units total) by mouth every 7 (seven) days. X 12 weeks. (Patient taking differently: Take 50,000 Units by mouth every 30 (thirty) days. X 12 weeks.)   No facility-administered encounter medications on file as of 03/31/2022.     Review of Systems  Review of Systems  Constitutional: Negative.   HENT: Negative.    Cardiovascular: Negative.   Gastrointestinal: Negative.   Allergic/Immunologic: Negative.   Neurological: Negative.   Psychiatric/Behavioral: Negative.         Physical Exam  BP (!) 126/57   Pulse 77   Temp 97.9 F (36.6 C)   Ht 6\' 6"  (1.981 m)   Wt 219 lb 3.2 oz (99.4 kg)   SpO2 100%   BMI 25.33 kg/m   Wt Readings from Last 5 Encounters:  03/31/22 219 lb 3.2 oz (99.4 kg)  02/09/22 217 lb 6.4 oz (98.6 kg)  11/25/21 213 lb (96.6 kg)  08/26/21 212 lb 8 oz  (96.4 kg)  05/30/21 210 lb 6.4 oz (95.4 kg)     Physical Exam Vitals and nursing note reviewed.  Constitutional:      General: He is not in acute distress.    Appearance: He is well-developed.  Cardiovascular:     Rate and Rhythm: Normal rate and regular rhythm.  Pulmonary:     Effort: Pulmonary effort is normal.     Breath sounds: Normal breath sounds.  Skin:    General: Skin is warm and dry.  Neurological:     Mental Status: He is alert and oriented to person, place, and time.      Lab Results:  CBC    Component Value Date/Time   WBC 8.1 02/10/2022 0920   RBC 2.45 (L) 02/10/2022 0920   HGB 7.8 (L) 02/10/2022 0920   HGB 7.9 (L) 02/09/2022 1601   HCT 23.4 (L) 02/10/2022 0920   HCT 23.7 (L) 02/09/2022 1601   PLT 193 02/10/2022 0920   PLT 212 02/09/2022 1601   MCV 95.5 02/10/2022 0920   MCV 96 02/09/2022 1601   MCH 31.8 02/10/2022 0920   MCHC 33.3 02/10/2022 0920   RDW 19.9 (H) 02/10/2022 0920   RDW 19.4 (H) 02/09/2022 1601   LYMPHSABS 2.4 05/05/2021 1505   MONOABS 0.6 09/04/2020 0047   EOSABS 0.2 05/05/2021 1505   BASOSABS 0.1 05/05/2021 1505    BMET    Component Value Date/Time   NA 138 02/09/2022 1601   K 4.0 02/09/2022 1601   CL 103 02/09/2022 1601   CO2 21 02/09/2022 1601   GLUCOSE 92 02/09/2022 1601   GLUCOSE 91 04/25/2021 1627   BUN 7 02/09/2022 1601   CREATININE 0.76 02/09/2022 1601   CREATININE 0.70 04/07/2014 1823   CALCIUM 8.8 02/09/2022 1601   GFRNONAA >60 04/25/2021 1627   GFRAA 135 02/03/2020 0935    BNP No results found for: "BNP"  ProBNP No results found for: "PROBNP"  Imaging: DG Chest 2 View  Result Date: 03/31/2022 CLINICAL DATA:  Provided history: Chronic cough. History of COVID. Chest pain. EXAM: CHEST - 2 VIEW COMPARISON:  Prior chest radiographs 04/25/2021 and earlier. FINDINGS: Heart size within normal limits. No appreciable airspace consolidation. No evidence of pleural effusion or pneumothorax. Dextrocurvature of the  thoracic spine. IMPRESSION: No evidence of active cardiopulmonary disease. Dextrocurvature of the thoracic spine. Electronically Signed   By: Kellie Simmering D.O.   On: 03/31/2022 14:24     Assessment & Plan:   History of COVID-19 - DG Chest 2 View - azithromycin (ZITHROMAX) 250 MG tablet; Take 2 tablets on day 1, then 1 tablet daily on days 2 through 5  Dispense: 6 tablet; Refill: 0 - predniSONE (DELTASONE) 20 MG tablet; Take 1 tablet (20 mg total) by mouth daily with breakfast for 5 days.  Dispense: 5 tablet; Refill: 0  2. Chronic cough  - DG Chest 2 View - azithromycin (ZITHROMAX) 250 MG tablet; Take 2 tablets on day 1, then 1 tablet daily on days 2 through 5  Dispense: 6 tablet; Refill: 0 - predniSONE (DELTASONE) 20 MG tablet; Take 1 tablet (20 mg total) by mouth daily with breakfast for 5 days.  Dispense: 5 tablet; Refill: 0 - benzonatate (TESSALON) 200 MG capsule; Take 1 capsule (200 mg total) by mouth 2 (two) times daily as needed for cough.  Dispense: 20 capsule; Refill: 0  Follow up:  Follow up in 3 months     Fenton Foy, NP 04/12/2022

## 2022-04-10 ENCOUNTER — Telehealth: Payer: Self-pay | Admitting: Hematology and Oncology

## 2022-04-10 NOTE — Telephone Encounter (Signed)
scheduled per 3/8 referral , pt has been called and confirmed date and time. Pt is aware of location and to arrive early for check in

## 2022-04-12 ENCOUNTER — Encounter: Payer: Self-pay | Admitting: Nurse Practitioner

## 2022-04-12 DIAGNOSIS — Z8616 Personal history of COVID-19: Secondary | ICD-10-CM | POA: Insufficient documentation

## 2022-04-12 NOTE — Assessment & Plan Note (Signed)
-   DG Chest 2 View - azithromycin (ZITHROMAX) 250 MG tablet; Take 2 tablets on day 1, then 1 tablet daily on days 2 through 5  Dispense: 6 tablet; Refill: 0 - predniSONE (DELTASONE) 20 MG tablet; Take 1 tablet (20 mg total) by mouth daily with breakfast for 5 days.  Dispense: 5 tablet; Refill: 0  2. Chronic cough  - DG Chest 2 View - azithromycin (ZITHROMAX) 250 MG tablet; Take 2 tablets on day 1, then 1 tablet daily on days 2 through 5  Dispense: 6 tablet; Refill: 0 - predniSONE (DELTASONE) 20 MG tablet; Take 1 tablet (20 mg total) by mouth daily with breakfast for 5 days.  Dispense: 5 tablet; Refill: 0 - benzonatate (TESSALON) 200 MG capsule; Take 1 capsule (200 mg total) by mouth 2 (two) times daily as needed for cough.  Dispense: 20 capsule; Refill: 0  Follow up:  Follow up in 3 months

## 2022-05-04 ENCOUNTER — Ambulatory Visit
Admission: EM | Admit: 2022-05-04 | Discharge: 2022-05-04 | Disposition: A | Discharge status: Home / Self Care | Payer: BC Managed Care – PPO | Attending: Urgent Care | Admitting: Urgent Care

## 2022-05-04 DIAGNOSIS — J4520 Mild intermittent asthma, uncomplicated: Secondary | ICD-10-CM | POA: Diagnosis not present

## 2022-05-04 DIAGNOSIS — J069 Acute upper respiratory infection, unspecified: Secondary | ICD-10-CM

## 2022-05-04 MED ORDER — PREDNISONE 10 MG PO TABS: 30.0000 mg | ORAL_TABLET | Freq: Every day | ORAL | 0 refills | Status: DC

## 2022-05-04 MED ORDER — CETIRIZINE HCL 10 MG PO TABS: 10.0000 mg | ORAL_TABLET | Freq: Every day | ORAL | 0 refills | Status: DC

## 2022-05-04 MED ORDER — AMOXICILLIN 875 MG PO TABS: 875.0000 mg | ORAL_TABLET | Freq: Two times a day (BID) | ORAL | 0 refills | Status: DC

## 2022-05-04 NOTE — ED Notes (Signed)
PER MANI, PA PT REFUSED STD SCREENING AT THIS TIME-NO CYTO SWAB OBTAINED

## 2022-05-04 NOTE — ED Triage Notes (Addendum)
Pt prod cough, burning to anterior neck "when I walk and breathe more" x 1 week-NAD-steady gait-when asked pain site-pt reports pain to left medial calf x today-denies injury

## 2022-05-04 NOTE — Discharge Instructions (Addendum)
We will manage this as an upper respiratory infection with amoxicillin. For sore throat or cough try using a honey-based tea. Use 3 teaspoons of honey with juice squeezed from half lemon. Place shaved pieces of ginger into 1/2-1 cup of water and warm over stove top. Then mix the ingredients and repeat every 4 hours as needed. Please take ibuprofen 600mg  every 6 hours with food alternating with OR taken together with Tylenol 500mg -650mg  every 6 hours for throat pain, fevers, aches and pains. Hydrate very well with at least 2 liters of water. Eat light meals such as soups (chicken and noodles, vegetable, chicken and wild rice).  Do not eat foods that you are allergic to.  Taking an antihistamine like Zyrtec can help against postnasal drainage, sinus congestion which can cause sinus pain, sinus headaches, throat pain, painful swallowing, coughing.  You can take this together with prednisone and albuterol for your reactive airway.  Use cough medication as needed.

## 2022-05-04 NOTE — ED Provider Notes (Signed)
[facility]  Note:  This document was prepared using Systems analyst and may include unintentional dictation errors.  MRN: [id number] DOB: Sep 09, 1990  Subjective:   Gregory Espinoza is a 32 y.o. male presenting for 1 week history of persistent throat pain, drainage, productive cough, pleuritic pain, chest tightness.  Patient has a history of shortness of breath, reactive airways.  Has an albuterol inhaler that he could use.  No smoking of any kind including cigarettes, cigars, vaping, marijuana use.    No current facility-administered medications for this encounter.  Current Outpatient Medications:    albuterol (VENTOLIN HFA) 108 (90 Base) MCG/ACT inhaler, Inhale 2 puffs into the lungs every 6 (six) hours as needed for wheezing or shortness of breath., Disp: 8 g, Rfl: 0   albuterol (VENTOLIN HFA) 108 (90 Base) MCG/ACT inhaler, Inhale 2 puffs into the lungs every 6 (six) hours as needed for wheezing or shortness of breath., Disp: 8 g, Rfl: 0   apixaban (ELIQUIS) 2.5 MG TABS tablet, Take 2.5 mg by mouth 2 (two) times daily., Disp: , Rfl:    benzonatate (TESSALON) 200 MG capsule, Take 1 capsule (200 mg total) by mouth 2 (two) times daily as needed for cough., Disp: 20 capsule, Rfl: 0   folic acid (FOLVITE) 1 MG tablet, Take 1 mg by mouth daily., Disp: , Rfl:    hydroxyurea (HYDREA) 500 MG capsule, Take 500 mg by mouth daily. May take with food to minimize GI side effects., Disp: , Rfl:    Multiple Vitamin (ONE-A-DAY 55 PLUS PO), Take 1 tablet by mouth daily., Disp: , Rfl:    Vitamin D, Ergocalciferol, (DRISDOL) 1.25 MG (50000 UNIT) CAPS capsule, Take 1 capsule (50,000 Units total) by mouth every 7 (seven) days. X 12 weeks. (Patient taking differently: Take 50,000 Units by mouth every 30 (thirty) days. X 12 weeks.), Disp: 5 capsule, Rfl: 2   Allergies  Allergen Reactions   Peanut Butter Flavor Anaphylaxis    Pt stated he has shortness of breathe  when eating peanuts and peanut butter   Sunflower Oil Anaphylaxis    Pt stated he has shortness of breathe when eating sunflower seeds and oil   Prilosec [Omeprazole] Other (See Comments)    Pt states he can't breath too well    Past Medical History:  Diagnosis Date   Erythrocytopenia 12/2018   Hemoglobin S-S disease    History of PCR DNA positive for HSV1 10/2019   Sickle cell anemia    Thrombocytopenia 12/2018   Vitamin D deficiency 12/2018     Past Surgical History:  Procedure Laterality Date   DENTAL SURGERY      Family History  Problem Relation Age of Onset   Hypertension Mother    Hypertension Father    Sickle cell anemia Brother     Social History   Tobacco Use   Smoking status: Never   Smokeless tobacco: Never  Vaping Use   Vaping Use: Never used  Substance Use Topics   Alcohol use: No    Alcohol/week: 0.0 standard drinks of alcohol   Drug use: No    ROS   Objective:   Vitals: BP 137/82 (BP Location: Right Arm)   Pulse 97   Temp 98 F (36.7 C) (Oral)   Resp 20   SpO2 94%   Physical Exam Constitutional:      General: He is not in acute distress.    Appearance: Normal appearance. He is well-developed and normal  weight. He is not ill-appearing, toxic-appearing or diaphoretic.  HENT:     Head: Normocephalic and atraumatic.     Right Ear: Tympanic membrane, ear canal and external ear normal. No drainage, swelling or tenderness. No middle ear effusion. There is no impacted cerumen. Tympanic membrane is not erythematous or bulging.     Left Ear: Tympanic membrane, ear canal and external ear normal. No drainage, swelling or tenderness.  No middle ear effusion. There is no impacted cerumen. Tympanic membrane is not erythematous or bulging.     Nose: Nose normal. No congestion or rhinorrhea.     Mouth/Throat:     Mouth: Mucous membranes are moist.     Pharynx: No pharyngeal swelling, oropharyngeal exudate, posterior oropharyngeal erythema or uvula  swelling.  Eyes:     General: No scleral icterus.       Right eye: No discharge.        Left eye: No discharge.     Extraocular Movements: Extraocular movements intact.     Conjunctiva/sclera: Conjunctivae normal.  Cardiovascular:     Rate and Rhythm: Normal rate and regular rhythm.     Heart sounds: Normal heart sounds. No murmur heard.    No friction rub. No gallop.  Pulmonary:     Effort: Pulmonary effort is normal. No respiratory distress.     Breath sounds: Normal breath sounds. No stridor. No wheezing, rhonchi or rales.  Musculoskeletal:     Cervical back: Normal range of motion and neck supple. No rigidity. No muscular tenderness.  Neurological:     General: No focal deficit present.     Mental Status: He is alert and oriented to person, place, and time.  Psychiatric:        Mood and Affect: Mood normal.        Behavior: Behavior normal.        Thought Content: Thought content normal.     Assessment and Plan :   PDMP not reviewed this encounter.  1. Acute upper respiratory infection   2. Mild intermittent reactive airway disease without complication     Given timeline of illness, deferred respiratory testing. Deferred imaging given clear cardiopulmonary exam, hemodynamically stable vital signs.  Patient is to start on oral prednisone course in the context of his reactive airway, respiratory symptoms. Counseled patient on potential for adverse effects with medications prescribed/recommended today, ER and return-to-clinic precautions discussed, patient verbalized understanding.   At discharge, patient requested STI testing.  As I started to place orders, patient change his mind and stated that he would like to come in tomorrow instead to get blood work done for HIV, syphilis and do the penis swab today and as well.   Jaynee Eagles, [place] 05/04/22 [id number]

## 2022-05-05 ENCOUNTER — Ambulatory Visit
Admission: RE | Admit: 2022-05-05 | Discharge: 2022-05-05 | Disposition: A | Discharge status: Home / Self Care | Payer: BC Managed Care – PPO | Source: Ambulatory Visit | Attending: Nurse Practitioner | Admitting: Nurse Practitioner

## 2022-05-05 VITALS — BP 133/74 | HR 70 | Temp 98.1°F | Resp 18

## 2022-05-05 DIAGNOSIS — Z113 Encounter for screening for infections with a predominantly sexual mode of transmission: Secondary | ICD-10-CM | POA: Insufficient documentation

## 2022-05-05 DIAGNOSIS — J069 Acute upper respiratory infection, unspecified: Secondary | ICD-10-CM | POA: Insufficient documentation

## 2022-05-05 MED ORDER — DOXYCYCLINE HYCLATE 100 MG PO CAPS
100.0000 mg | ORAL_CAPSULE | Freq: Two times a day (BID) | ORAL | 0 refills | Status: AC
Start: 2022-05-05 — End: 2022-05-12

## 2022-05-05 NOTE — ED Triage Notes (Addendum)
Patient requesting to be tested for STDs (including bloodwork), he denies symptoms.

## 2022-05-05 NOTE — ED Provider Notes (Signed)
[facility]    CSN: [id number] Arrival date & time: 05/05/22  1255      History   Chief Complaint Chief Complaint  Patient presents with   SEXUALLY TRANSMITTED DISEASE    HPI Gregory Espinoza is a 32 y.o. male presents for evaluation of STD screening.  Patient denies any known STD exposure or concern.  He is currently asymptomatic and denies any penile discharge, testicular pain or swelling.  He is interested in screening.  He was seen yesterday but blood work and testing was not performed.  In addition yesterday he was seen for respiratory infection and prescribed amoxicillin.  He states he is allergic to amoxicillin and is requesting a different antibiotic.  He has not picked this up or started it.  His reaction includes palpitation and hives.  He does have a history of asthma and was also prescribed prednisone yesterday.  No other concerns at this time.  HPI  Past Medical History:  Diagnosis Date   Erythrocytopenia 12/2018   Hemoglobin S-S disease    History of PCR DNA positive for HSV1 10/2019   Sickle cell anemia    Thrombocytopenia 12/2018   Vitamin D deficiency 12/2018    Patient Active Problem List   Diagnosis Date Noted   History of COVID-19 04/12/2022   Screening examination for STD (sexually transmitted disease) 02/10/2022   Routine adult health maintenance 08/26/2021   Shortness of breath 05/04/2021   Abdominal pain, right lower quadrant 12/19/2018   Absolute anemia 12/13/2018   Hb-SS disease with acute chest syndrome 10/24/2018   Transaminitis 03/13/2018   Sickle cell disease, type SS 04/24/2014    Past Surgical History:  Procedure Laterality Date   DENTAL SURGERY         Home Medications    Prior to Admission medications   Medication Sig Start Date End Date Taking? Authorizing Provider  doxycycline (VIBRAMYCIN) 100 MG capsule Take 1 capsule (100 mg total) by mouth 2 (two) times daily for 7 days. 05/05/22 05/12/22 Yes Radford Pax, NP   albuterol (VENTOLIN HFA) 108 (90 Base) MCG/ACT inhaler Inhale 2 puffs into the lungs every 6 (six) hours as needed for wheezing or shortness of breath. 03/31/22   Ivonne Andrew, NP  apixaban (ELIQUIS) 2.5 MG TABS tablet Take 2.5 mg by mouth 2 (two) times daily.    [provider]  benzonatate (TESSALON) 200 MG capsule Take 1 capsule (200 mg total) by mouth 2 (two) times daily as needed for cough. 03/31/22   Ivonne Andrew, NP  cetirizine (ZYRTEC ALLERGY) 10 MG tablet Take 1 tablet (10 mg total) by mouth daily. 05/04/22   Wallis Bamberg, PA-C  folic acid (FOLVITE) 1 MG tablet Take 1 mg by mouth daily. 12/05/17   [provider]  hydroxyurea (HYDREA) 500 MG capsule Take 500 mg by mouth daily. May take with food to minimize GI side effects.    [provider]  Multiple Vitamin (ONE-A-DAY 55 PLUS PO) Take 1 tablet by mouth daily.    [provider]  predniSONE (DELTASONE) 10 MG tablet Take 3 tablets (30 mg total) by mouth daily with breakfast. 05/04/22   Wallis Bamberg, PA-C  Vitamin D, Ergocalciferol, (DRISDOL) 1.25 MG (50000 UNIT) CAPS capsule Take 1 capsule (50,000 Units total) by mouth every 7 (seven) days. X 12 weeks. Patient taking differently: Take 50,000 Units by mouth every 30 (thirty) days. X 12 weeks. 02/06/20   Kallie Locks, FNP    Family History  Family History  Problem Relation Age of Onset   Hypertension Mother    Hypertension Father    Sickle cell anemia Brother     Social History Social History   Tobacco Use   Smoking status: Never   Smokeless tobacco: Never  Vaping Use   Vaping Use: Never used  Substance Use Topics   Alcohol use: No    Alcohol/week: 0.0 standard drinks of alcohol   Drug use: No     Allergies   Peanut butter flavor, Sunflower oil, Amoxicillin, and Prilosec [omeprazole]   Review of Systems Review of Systems  Genitourinary:        STD testing     Physical Exam Triage Vital Signs ED Triage Vitals [05/05/22 1301]   Enc Vitals Group     BP 133/74     Pulse Rate 70     Resp 18     Temp 98.1 F (36.7 C)     Temp Source Oral     SpO2 95 %     Weight      Height      Head Circumference      Peak Flow      Pain Score 0     Pain Loc      Pain Edu?      Excl. in GC?    No data found.  Updated Vital Signs BP 133/74 (BP Location: Right Arm)   Pulse 70   Temp 98.1 F (36.7 C) (Oral)   Resp 18   SpO2 95%   Visual Acuity Right Eye Distance:   Left Eye Distance:   Bilateral Distance:    Right Eye Near:   Left Eye Near:    Bilateral Near:     Physical Exam Vitals and nursing note reviewed.  Constitutional:      Appearance: Normal appearance.  HENT:     Head: Normocephalic and atraumatic.  Eyes:     Pupils: Pupils are equal, round, and reactive to light.  Cardiovascular:     Rate and Rhythm: Normal rate and regular rhythm.     Heart sounds: Normal heart sounds.  Pulmonary:     Effort: Pulmonary effort is normal.     Breath sounds: Normal breath sounds.  Skin:    General: Skin is warm and dry.  Neurological:     General: No focal deficit present.     Mental Status: He is alert and oriented to person, place, and time.  Psychiatric:        Mood and Affect: Mood normal.        Behavior: Behavior normal.      UC Treatments / Results  Labs (all labs ordered are listed, but only abnormal results are displayed) Labs Reviewed  HIV ANTIBODY (ROUTINE TESTING W REFLEX)  RPR  CYTOLOGY, (ORAL, ANAL, URETHRAL) ANCILLARY ONLY    EKG   Radiology No results found.  Procedures Procedures (including critical care time)  Medications Ordered in UC Medications - No data to display  Initial Impression / Assessment and Plan / UC Course  I have reviewed the triage vital signs and the nursing notes.  Pertinent labs & imaging results that were available during my care of the patient were reviewed by me and considered in my medical decision making (see chart for details).     STD  testing is ordered and will contact for any positive results Order for amoxicillin canceled patient will start doxycycline twice daily for 7 days Follow-up with PCP if  there is no improvement in his respiratory symptoms ER precautions reviewed and patient verbalized understanding Final Clinical Impressions(s) / UC Diagnoses   Final diagnoses:  Screening examination for STD (sexually transmitted disease)  Acute upper respiratory infection     Discharge Instructions      Doxycycline sent to your pharmacy.  Take this twice daily for 7 days The clinical contact you for any positive results for the testing done today Follow-up with your PCP as needed     ED Prescriptions     Medication Sig Dispense Auth. Provider   doxycycline (VIBRAMYCIN) 100 MG capsule Take 1 capsule (100 mg total) by mouth 2 (two) times daily for 7 days. 14 capsule Radford PaxMayer, Jodi R, NP      PDMP not reviewed this encounter.   Radford PaxMayer, Jodi R, NP 05/05/22 1327

## 2022-05-05 NOTE — Discharge Instructions (Signed)
Doxycycline sent to your pharmacy.  Take this twice daily for 7 days The clinical contact you for any positive results for the testing done today Follow-up with your PCP as needed

## 2022-05-06 LAB — RPR: RPR Ser Ql: NONREACTIVE

## 2022-05-06 LAB — HIV ANTIBODY (ROUTINE TESTING W REFLEX): HIV Screen 4th Generation wRfx: NONREACTIVE

## 2022-05-08 ENCOUNTER — Other Ambulatory Visit: Payer: BC Managed Care – PPO

## 2022-05-08 ENCOUNTER — Encounter: Payer: BC Managed Care – PPO | Admitting: Hematology and Oncology

## 2022-05-08 LAB — CYTOLOGY, (ORAL, ANAL, URETHRAL) ANCILLARY ONLY
Chlamydia: NEGATIVE
Comment: NEGATIVE
Comment: NEGATIVE
Comment: NORMAL
Neisseria Gonorrhea: NEGATIVE
Trichomonas: NEGATIVE

## 2022-05-10 DIAGNOSIS — I2089 Other forms of angina pectoris: Secondary | ICD-10-CM | POA: Diagnosis not present

## 2022-05-10 DIAGNOSIS — D571 Sickle-cell disease without crisis: Secondary | ICD-10-CM | POA: Diagnosis not present

## 2022-05-11 DIAGNOSIS — F4321 Adjustment disorder with depressed mood: Secondary | ICD-10-CM | POA: Diagnosis not present

## 2022-05-19 DIAGNOSIS — F4321 Adjustment disorder with depressed mood: Secondary | ICD-10-CM | POA: Diagnosis not present

## 2022-05-19 IMAGING — CT CT HEAD W/O CM
3 series · 14 of 47 positions shown, 16 images · non-contrast
Comparison: None.

CLINICAL DATA: Transient ischemic attack (TIA)



[Series 2: head wo · axial · 0.47mm/px · z∈[-155,-15]mm · 8 of 34 slices shown, 10 images]
[im 3/34  brain]
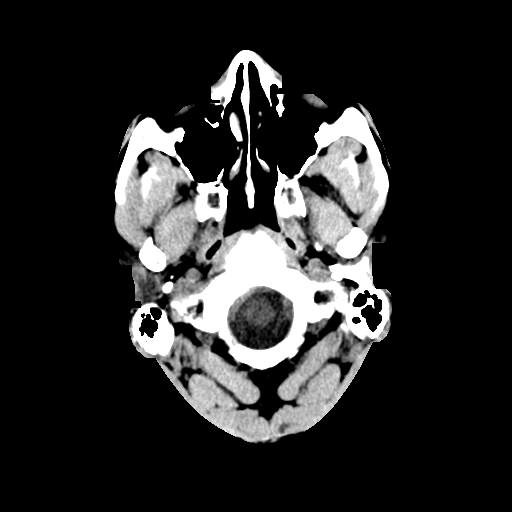
[im 3/34  bone]
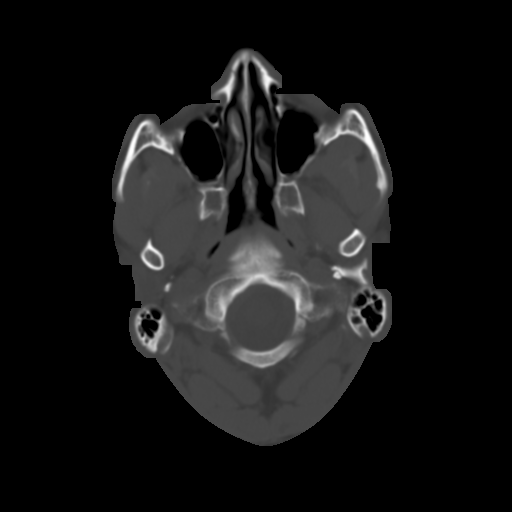
[im 7/34  brain]
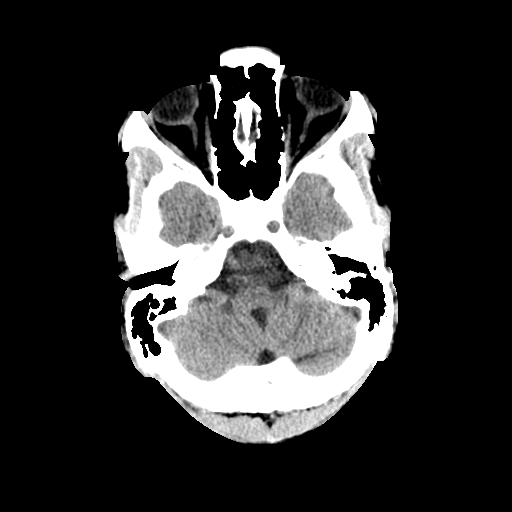
[im 11/34  brain]
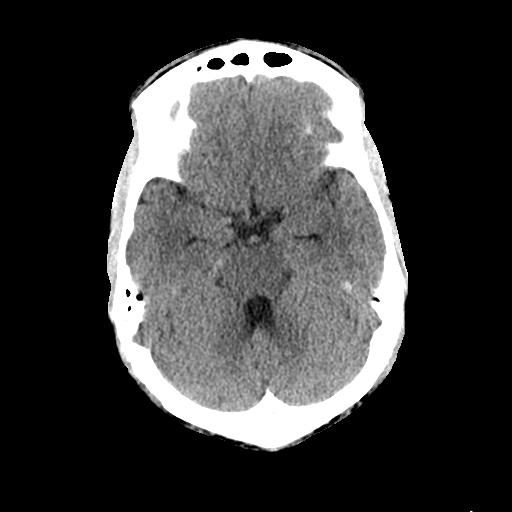
[im 15/34  brain]
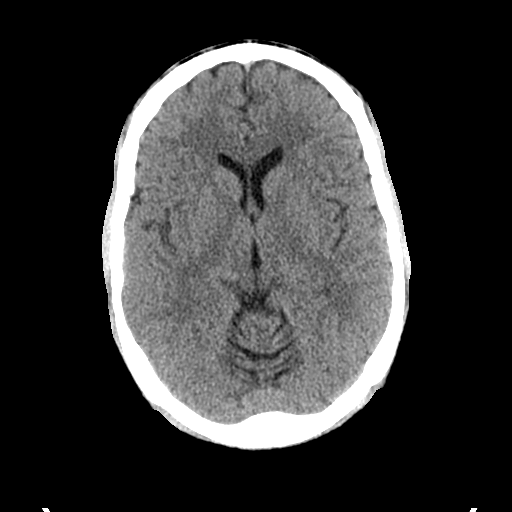
[im 19/34  brain]
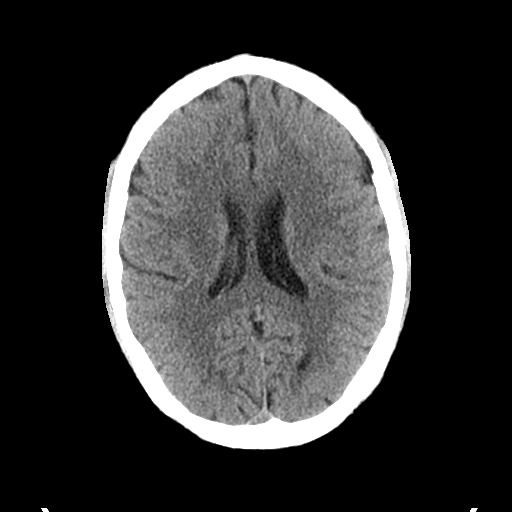
[im 19/34  bone]
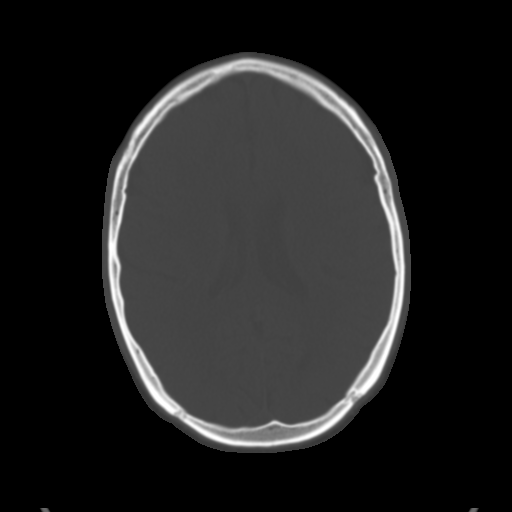
[im 23/34  brain]
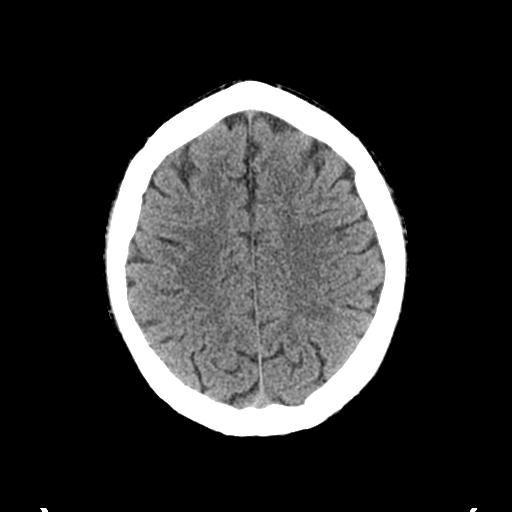
[im 27/34  brain]
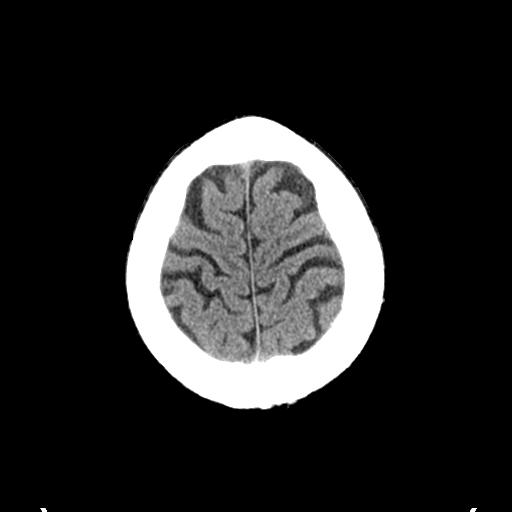
[im 31/34  brain]
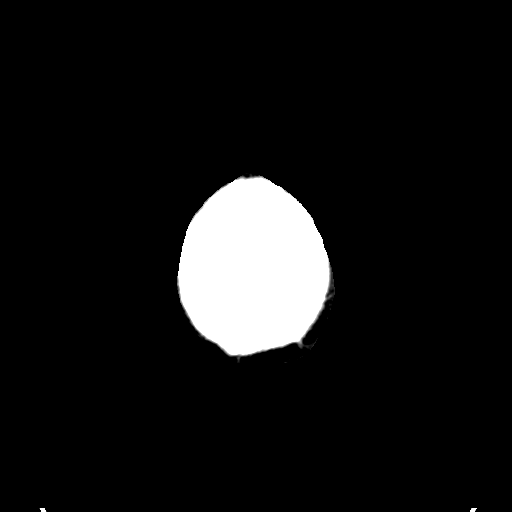

[Series 4: coronal soft tissue · coronal · 0.33mm/px · 3 of 69 slices shown]
[im 23/69  brain]
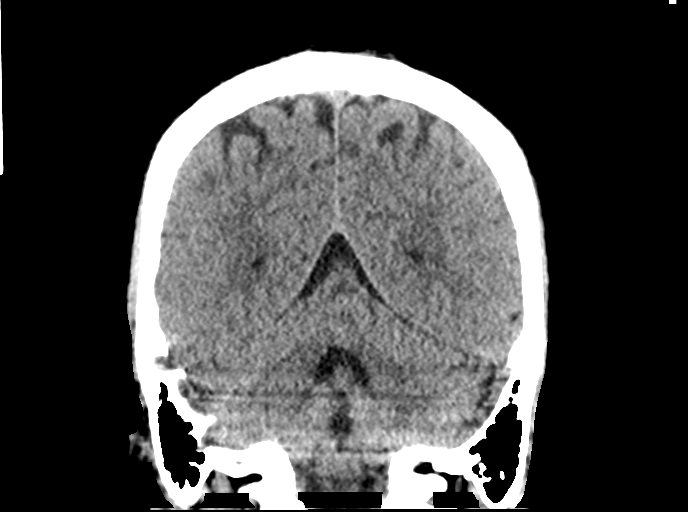
[im 31/69  brain]
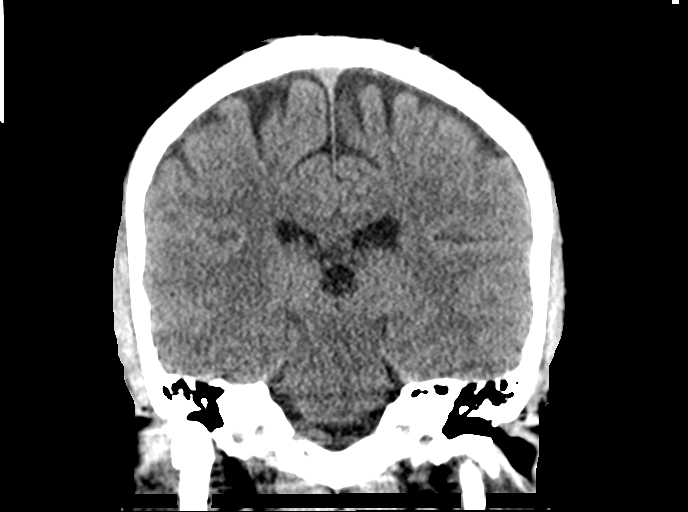
[im 38/69  brain]
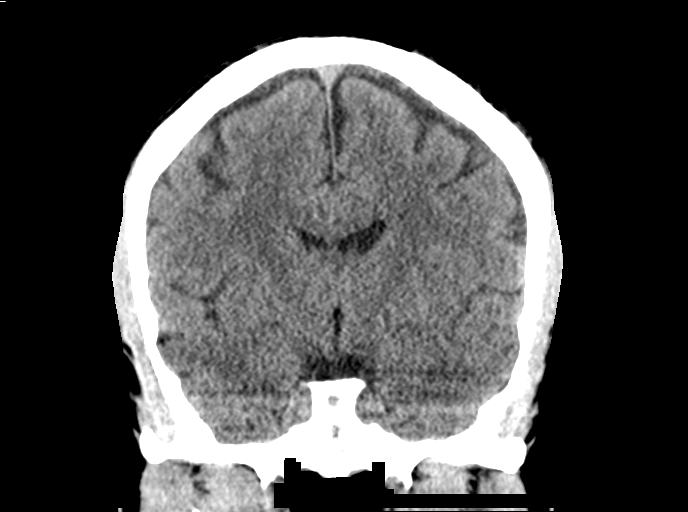

[Series 5: sagittal soft tissue · sagittal · 0.33mm/px · 3 of 53 slices shown]
[im 18/53  brain]
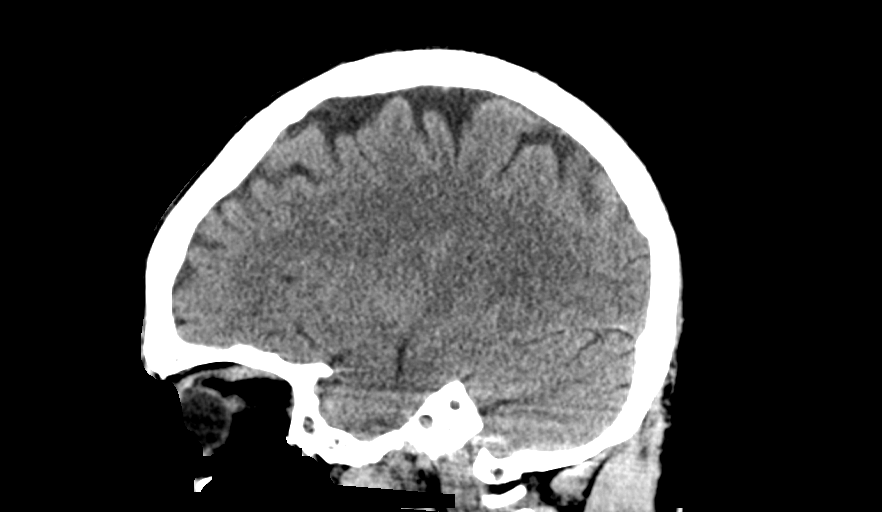
[im 27/53  brain]
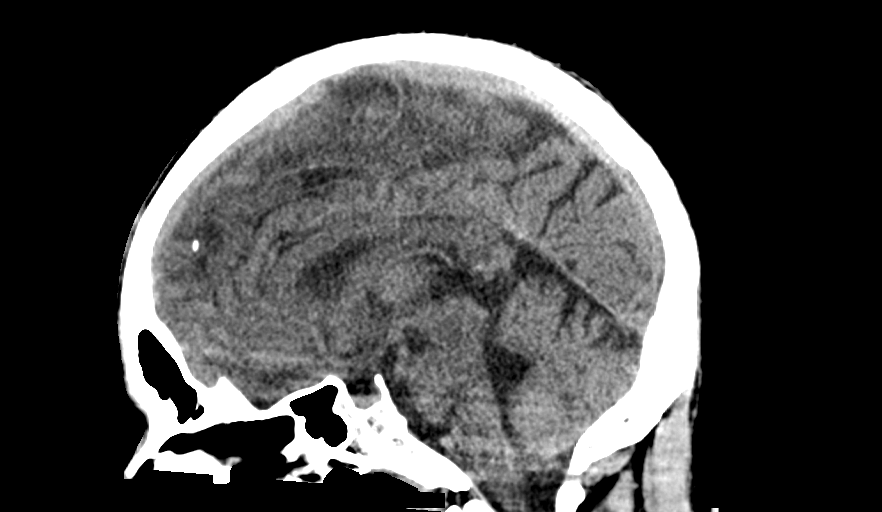
[im 35/53  brain]
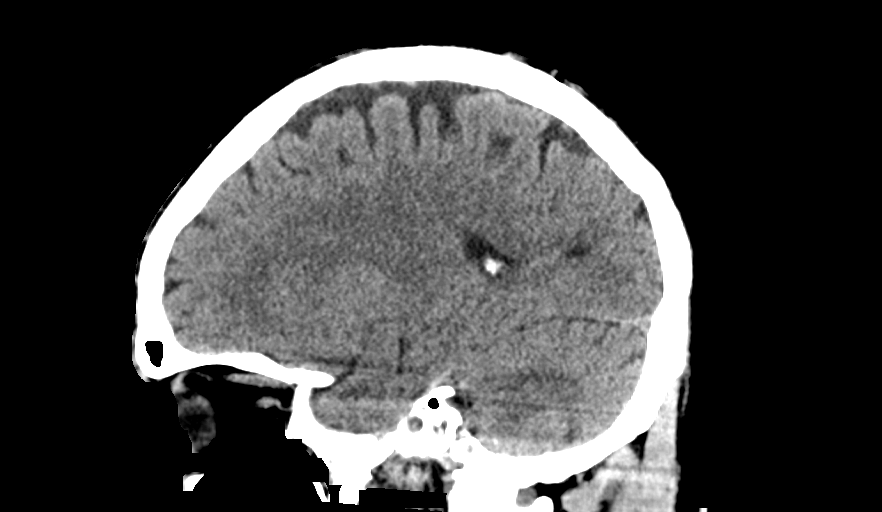

[14 of 47 positions shown; findings below may reference images not displayed]

FINDINGS: Brain:

No evidence of large-territorial acute infarction. No parenchymal
hemorrhage. No mass lesion. No extra-axial collection.

No mass effect or midline shift. No hydrocephalus. Basilar cisterns
are patent.

Vascular: No hyperdense vessel.

Skull: No acute fracture or focal lesion.

Sinuses/Orbits: Paranasal sinuses and mastoid air cells are clear.
The orbits are unremarkable.

Other: None.
IMPRESSION: No acute intracranial abnormality.

## 2022-05-20 DIAGNOSIS — D571 Sickle-cell disease without crisis: Secondary | ICD-10-CM | POA: Diagnosis not present

## 2022-05-20 DIAGNOSIS — J069 Acute upper respiratory infection, unspecified: Secondary | ICD-10-CM | POA: Diagnosis not present

## 2022-05-20 DIAGNOSIS — R0981 Nasal congestion: Secondary | ICD-10-CM | POA: Diagnosis not present

## 2022-05-20 DIAGNOSIS — R519 Headache, unspecified: Secondary | ICD-10-CM | POA: Diagnosis not present

## 2022-05-20 DIAGNOSIS — B9689 Other specified bacterial agents as the cause of diseases classified elsewhere: Secondary | ICD-10-CM | POA: Diagnosis not present

## 2022-05-20 DIAGNOSIS — Z1383 Encounter for screening for respiratory disorder NEC: Secondary | ICD-10-CM | POA: Diagnosis not present

## 2022-05-20 DIAGNOSIS — Z20822 Contact with and (suspected) exposure to covid-19: Secondary | ICD-10-CM | POA: Diagnosis not present

## 2022-05-20 DIAGNOSIS — R059 Cough, unspecified: Secondary | ICD-10-CM | POA: Diagnosis not present

## 2022-05-26 DIAGNOSIS — F4321 Adjustment disorder with depressed mood: Secondary | ICD-10-CM | POA: Diagnosis not present

## 2022-05-26 DIAGNOSIS — I2089 Other forms of angina pectoris: Secondary | ICD-10-CM | POA: Diagnosis not present

## 2022-05-26 DIAGNOSIS — I491 Atrial premature depolarization: Secondary | ICD-10-CM | POA: Diagnosis not present

## 2022-05-26 DIAGNOSIS — D571 Sickle-cell disease without crisis: Secondary | ICD-10-CM | POA: Diagnosis not present

## 2022-05-26 DIAGNOSIS — I493 Ventricular premature depolarization: Secondary | ICD-10-CM | POA: Diagnosis not present

## 2022-05-29 ENCOUNTER — Ambulatory Visit: Payer: BC Managed Care – PPO | Admitting: Nurse Practitioner

## 2022-05-29 ENCOUNTER — Encounter: Payer: Self-pay | Admitting: Nurse Practitioner

## 2022-05-29 VITALS — BP 114/55 | HR 74 | Temp 98.1°F | Ht 78.0 in | Wt 219.4 lb

## 2022-05-29 DIAGNOSIS — D5700 Hb-SS disease with crisis, unspecified: Secondary | ICD-10-CM | POA: Diagnosis not present

## 2022-05-29 DIAGNOSIS — K219 Gastro-esophageal reflux disease without esophagitis: Secondary | ICD-10-CM | POA: Diagnosis not present

## 2022-05-29 MED ORDER — FAMOTIDINE 20 MG PO TABS
20.0000 mg | ORAL_TABLET | Freq: Two times a day (BID) | ORAL | 0 refills | Status: AC
Start: 2022-05-29 — End: 2022-06-28

## 2022-05-29 NOTE — Progress Notes (Signed)
@Patient  ID: Gregory Espinoza, male    DOB: [birth year], 32 y.o.   MRN: [id number]  Chief Complaint  Patient presents with   Sickle Cell Anemia    Referring provider: Kathrynn Speed, NP   HPI  Gregory Espinoza 32 y.o. 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      Patient presents today for a sickle cell follow-up.  Overall he has been doing well.  He did have to go to the emergency room couple weeks ago for a URI.  He was prescribed antibiotics and is feeling better now.  He states that he has been to see cardiology for intermittent chest pain and evaluation came back clear.  He states that he continues to have intermittent chest pain.  We discussed that this could be indigestion or GERD.  We will trial patient on Pepcid.  He has had an adverse reaction to omeprazole in the past. Denies f/c/s, n/v/d, hemoptysis, PND, leg swelling Denies chest pain or edema      Allergies  Allergen Reactions   Peanut Butter Flavor Anaphylaxis    Pt stated he has shortness of breathe when eating peanuts and peanut butter   Sunflower Oil Anaphylaxis    Pt stated he has shortness of breathe when eating sunflower seeds and oil   Amoxicillin Hives and Palpitations    Patient states he had an allergic reaction to this medication in the past.    Prilosec [Omeprazole] Other (See Comments)    Pt states he can't breath too well    Immunization History  Administered Date(s) Administered   Influenza,inj,Quad PF,6+ Mos 03/14/2018, 11/07/2018, 11/10/2020, 11/25/2021   Influenza,inj,Quad PF,6-35 Mos 11/10/2019   Influenza-Unspecified 03/14/2018   Meningococcal Conjugate 04/27/2014   Pneumococcal Conjugate-13 04/27/2014   Pneumococcal Polysaccharide-23 03/14/2018   Rabies, IM 10/04/2020, 10/07/2020,  10/11/2020, 10/22/2020    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   Sickle cell anemia (HCC)    Thrombocytopenia (HCC) 12/2018   Vitamin D deficiency 12/2018    Tobacco History: Social History   Tobacco Use  Smoking Status Never  Smokeless Tobacco Never   Counseling given: Not Answered   Outpatient Encounter Medications as of 05/29/2022  Medication Sig   albuterol (VENTOLIN HFA) 108 (90 Base) MCG/ACT inhaler Inhale 2 puffs into the lungs every 6 (six) hours as needed for wheezing or shortness of breath.   apixaban (ELIQUIS) 2.5 MG TABS tablet Take 2.5 mg by mouth 2 (two) times daily.   famotidine (PEPCID) 20 MG tablet Take 1 tablet (20 mg total) by mouth 2 (two) times daily.   folic acid (FOLVITE) 1 MG tablet Take 1 mg by mouth daily.   hydroxyurea (HYDREA) 500 MG capsule Take 500 mg by mouth daily. May take with food to minimize GI side effects.   Multiple Vitamin (ONE-A-DAY 55 PLUS PO) Take 1 tablet by mouth daily.   Vitamin D, Ergocalciferol, (DRISDOL) 1.25 MG (50000 UNIT) CAPS capsule Take 1 capsule (50,000 Units total) by mouth every 7 (seven) days. X 12 weeks. (Patient taking differently: Take 50,000 Units by mouth every 30 (thirty) days. X 12 weeks.)   cetirizine (ZYRTEC ALLERGY) 10 MG tablet Take 1 tablet (10 mg total) by mouth  daily. (Patient not taking: Reported on 05/29/2022)   predniSONE (DELTASONE) 10 MG tablet Take 3 tablets (30 mg total) by mouth daily with breakfast. (Patient not taking: Reported on 05/29/2022)   [DISCONTINUED] benzonatate (TESSALON) 200 MG capsule Take 1 capsule (200 mg total) by mouth 2 (two) times daily as needed for cough. (Patient not taking: Reported on 05/29/2022)   No facility-administered encounter medications on file as of 05/29/2022.     Review of Systems  Review of Systems  Constitutional: Negative.   HENT: Negative.     Cardiovascular: Negative.   Gastrointestinal: Negative.   Allergic/Immunologic: Negative.   Neurological: Negative.   Psychiatric/Behavioral: Negative.         Physical Exam  BP (!) 114/55   Pulse 74   Temp 98.1 F (36.7 C)   Ht 6\' 6"  (1.981 m)   Wt 219 lb 6.4 oz (99.5 kg)   SpO2 98%   BMI 25.35 kg/m   Wt Readings from Last 5 Encounters:  05/29/22 219 lb 6.4 oz (99.5 kg)  03/31/22 219 lb 3.2 oz (99.4 kg)  02/09/22 217 lb 6.4 oz (98.6 kg)  11/25/21 213 lb (96.6 kg)  08/26/21 212 lb 8 oz (96.4 kg)     Physical Exam Vitals and nursing note reviewed.  Constitutional:      General: He is not in acute distress.    Appearance: He is well-developed.  Cardiovascular:     Rate and Rhythm: Normal rate and regular rhythm.  Pulmonary:     Effort: Pulmonary effort is normal.     Breath sounds: Normal breath sounds.  Skin:    General: Skin is warm and dry.  Neurological:     Mental Status: He is alert and oriented to person, place, and time.       Assessment & Plan:   Gastroesophageal reflux disease without esophagitis - famotidine (PEPCID) 20 MG tablet; Take 1 tablet (20 mg total) by mouth 2 (two) times daily.  Dispense: 60 tablet; Refill: 0  Follow up:  Follow up in 3 months     Ivonne Andrew, NP 05/29/2022

## 2022-05-29 NOTE — Assessment & Plan Note (Signed)
-   famotidine (PEPCID) 20 MG tablet; Take 1 tablet (20 mg total) by mouth 2 (two) times daily.  Dispense: 60 tablet; Refill: 0  Follow up:  Follow up in 3 months

## 2022-05-29 NOTE — Patient Instructions (Signed)
1. Gastroesophageal reflux disease without esophagitis  - famotidine (PEPCID) 20 MG tablet; Take 1 tablet (20 mg total) by mouth 2 (two) times daily.  Dispense: 60 tablet; Refill: 0  Follow up:  Follow up in 3 months

## 2022-06-01 IMAGING — CR DG CHEST 2V
2 series · 2 of 2 positions shown · non-contrast
Comparison: 07/27/2018

CLINICAL DATA: Chest pain

EXAM:
CHEST - 2 VIEW

[w chest pa]
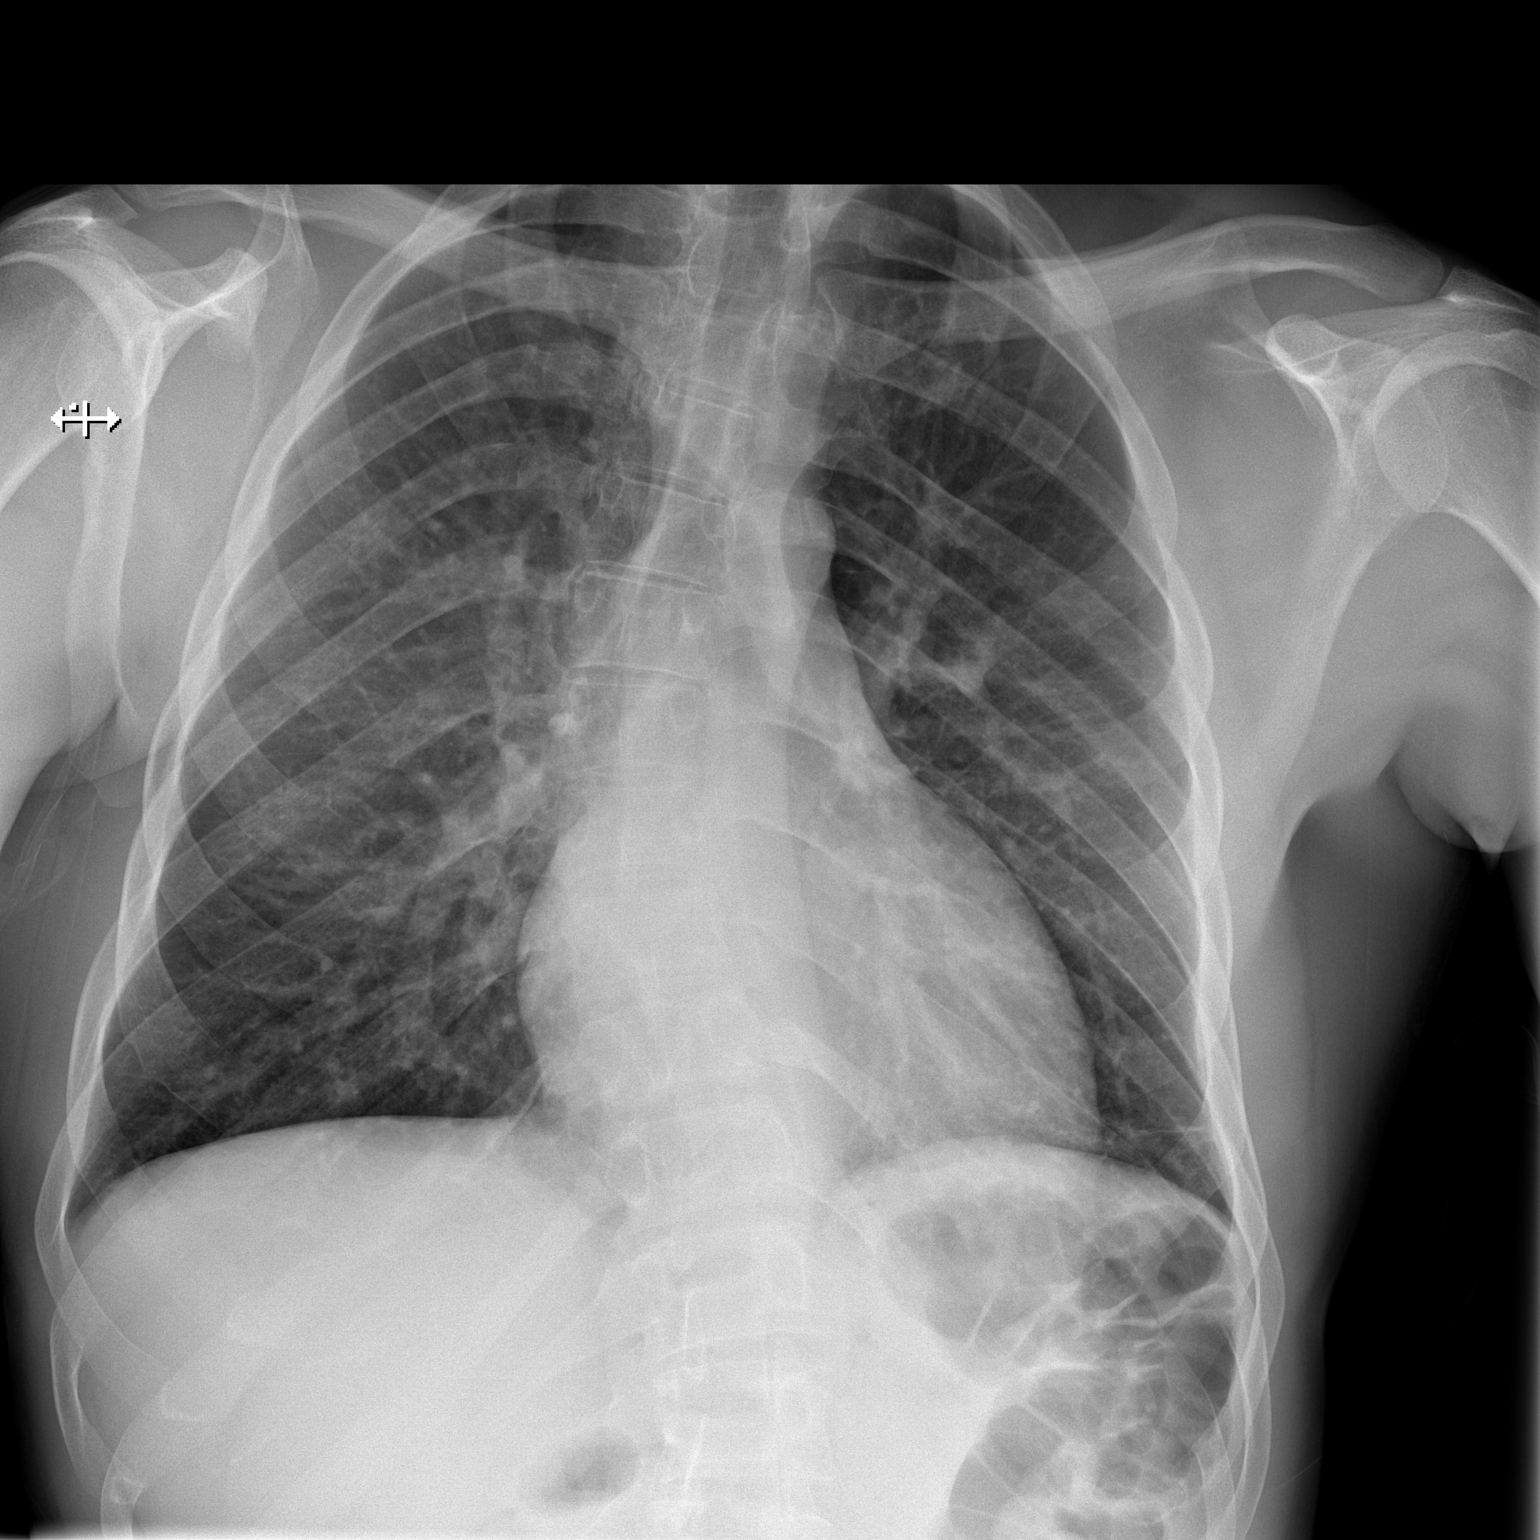

[w chest lat]
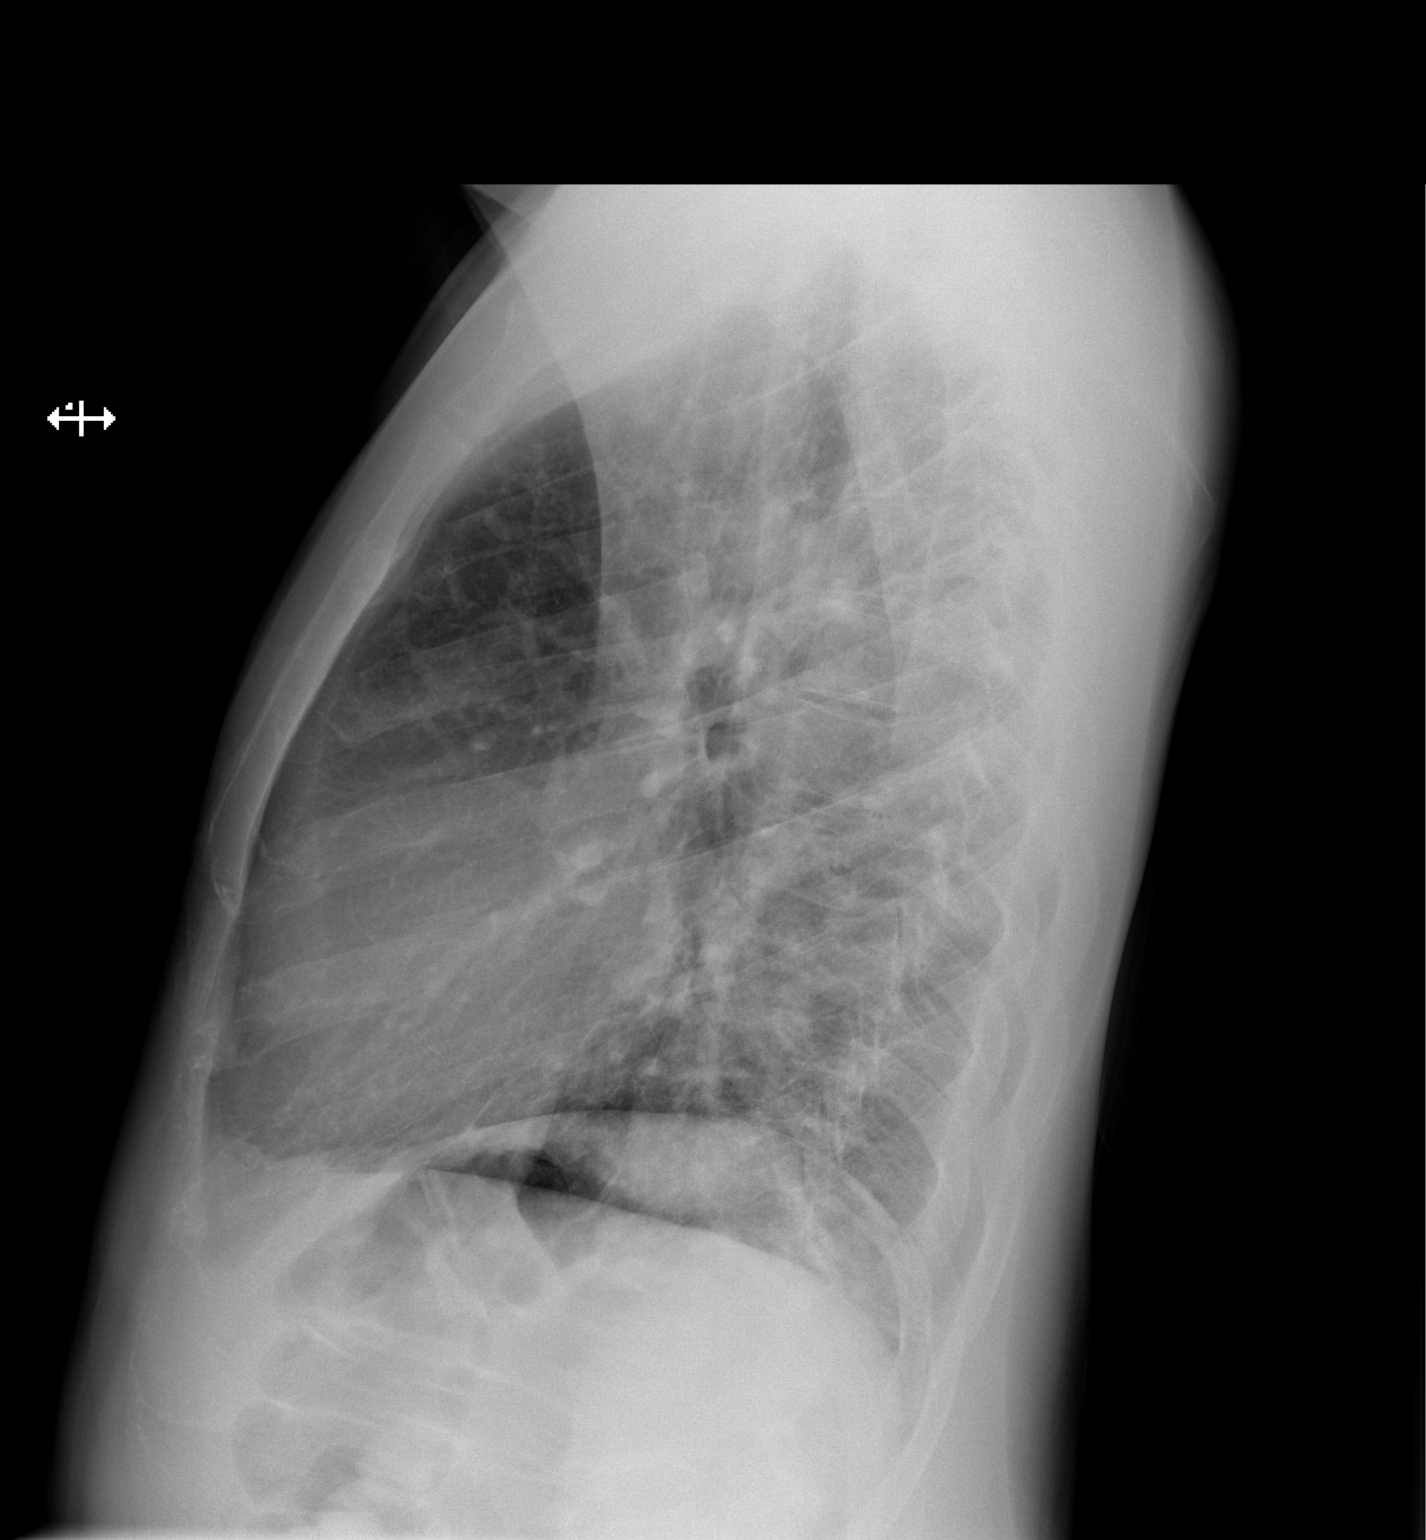

[2 of 2 positions shown; findings below may reference images not displayed]

FINDINGS: Heart and mediastinal contours are within normal limits. No focal
opacities or effusions. No acute bony abnormality.
IMPRESSION: No active cardiopulmonary disease.

## 2022-07-12 ENCOUNTER — Ambulatory Visit
Admission: EM | Admit: 2022-07-12 | Discharge: 2022-07-12 | Disposition: A | Discharge status: Home / Self Care | Payer: BC Managed Care – PPO | Attending: Urgent Care | Admitting: Urgent Care

## 2022-07-12 ENCOUNTER — Ambulatory Visit (INDEPENDENT_AMBULATORY_CARE_PROVIDER_SITE_OTHER): Payer: BC Managed Care – PPO

## 2022-07-12 DIAGNOSIS — J4520 Mild intermittent asthma, uncomplicated: Secondary | ICD-10-CM

## 2022-07-12 DIAGNOSIS — B349 Viral infection, unspecified: Secondary | ICD-10-CM

## 2022-07-12 DIAGNOSIS — Z1152 Encounter for screening for COVID-19: Secondary | ICD-10-CM | POA: Insufficient documentation

## 2022-07-12 DIAGNOSIS — R509 Fever, unspecified: Secondary | ICD-10-CM | POA: Diagnosis not present

## 2022-07-12 DIAGNOSIS — R0602 Shortness of breath: Secondary | ICD-10-CM | POA: Diagnosis not present

## 2022-07-12 DIAGNOSIS — R059 Cough, unspecified: Secondary | ICD-10-CM | POA: Diagnosis not present

## 2022-07-12 MED ORDER — PROMETHAZINE-DM 6.25-15 MG/5ML PO SYRP: 5.0000 mL | ORAL_SOLUTION | Freq: Three times a day (TID) | ORAL | 0 refills | Status: DC | PRN

## 2022-07-12 MED ORDER — CETIRIZINE HCL 10 MG PO TABS: 10.0000 mg | ORAL_TABLET | Freq: Every day | ORAL | 0 refills | Status: DC

## 2022-07-12 MED ORDER — PREDNISONE 20 MG PO TABS: ORAL_TABLET | ORAL | 0 refills | Status: DC

## 2022-07-12 NOTE — ED Triage Notes (Signed)
Pt reports cough, nasal congestion, fever 101 F x 3 days. Dayquil and nasal spray give some relief.

## 2022-07-12 NOTE — Discharge Instructions (Signed)
We will notify you of your test results as they arrive and may take between about 24 hours.  I encourage you to sign up for MyChart if you have not already done so as this can be the easiest way for us to communicate results to you online or through a phone app.  Generally, we only contact you if it is a positive test result.  In the meantime, if you develop worsening symptoms including fever, chest pain, shortness of breath despite our current treatment plan then please report to the emergency room as this may be a sign of worsening status from possible viral infection.  Otherwise, we will manage this as a viral syndrome. For sore throat or cough try using a honey-based tea. Use 3 teaspoons of honey with juice squeezed from half lemon. Place shaved pieces of ginger into 1/2-1 cup of water and warm over stove top. Then mix the ingredients and repeat every 4 hours as needed. Please take Tylenol 500mg-650mg every 6 hours for aches and pains, fevers. Hydrate very well with at least 2 liters of water. Eat light meals such as soups to replenish electrolytes and soft fruits, veggies. Start an antihistamine like Zyrtec (10mg daily) for postnasal drainage, sinus congestion.  You can take this together with prednisone and albuterol.  Use the cough medications as needed.  

## 2022-07-12 NOTE — ED Provider Notes (Signed)
[facility]  Note:  This document was prepared using Conservation officer, historic buildings and may include unintentional dictation errors.  MRN: [id number] DOB: 17-Jul-1990  Subjective:   Gregory Espinoza is a 32 y.o. male presenting for 3 day history of acute onset fever, productive cough, shortness of breath, wheezing, sinus congestion.  Has been using over-the-counter medications with minimal relief.  He has also been using his inhaler.  Does not need a refill.  No official diagnosis of asthma but reports that he has been managed for this.  No smoking of any kind including cigarettes, cigars, vaping, marijuana use.  Has concerns that this is his third illness in his many months.  Was seen twice in April for a viral respiratory illness.  No current facility-administered medications for this encounter.  Current Outpatient Medications:    Phenylephrine-DM-GG-APAP (VICKS DAYQUIL SEVERE COLD/FLU) 5-10-200-325 MG TABS, Take by mouth., Disp: , Rfl:    albuterol (VENTOLIN HFA) 108 (90 Base) MCG/ACT inhaler, Inhale 2 puffs into the lungs every 6 (six) hours as needed for wheezing or shortness of breath., Disp: 8 g, Rfl: 0   apixaban (ELIQUIS) 2.5 MG TABS tablet, Take 2.5 mg by mouth 2 (two) times daily., Disp: , Rfl:    cetirizine (ZYRTEC ALLERGY) 10 MG tablet, Take 1 tablet (10 mg total) by mouth daily. (Patient not taking: Reported on 05/29/2022), Disp: 30 tablet, Rfl: 0   famotidine (PEPCID) 20 MG tablet, Take 1 tablet (20 mg total) by mouth 2 (two) times daily., Disp: 60 tablet, Rfl: 0   folic acid (FOLVITE) 1 MG tablet, Take 1 mg by mouth daily., Disp: , Rfl:    hydroxyurea (HYDREA) 500 MG capsule, Take 500 mg by mouth daily. May take with food to minimize GI side effects., Disp: , Rfl:    Multiple Vitamin (ONE-A-DAY 55 PLUS PO), Take 1 tablet by mouth daily., Disp: , Rfl:    predniSONE (DELTASONE) 10 MG tablet, Take 3 tablets (30 mg total) by mouth daily with breakfast.  (Patient not taking: Reported on 05/29/2022), Disp: 15 tablet, Rfl: 0   Vitamin D, Ergocalciferol, (DRISDOL) 1.25 MG (50000 UNIT) CAPS capsule, Take 1 capsule (50,000 Units total) by mouth every 7 (seven) days. X 12 weeks. (Patient taking differently: Take 50,000 Units by mouth every 30 (thirty) days. X 12 weeks.), Disp: 5 capsule, Rfl: 2   Allergies  Allergen Reactions   Peanut Butter Flavor Anaphylaxis    Pt stated he has shortness of breathe when eating peanuts and peanut butter   Sunflower Oil Anaphylaxis    Pt stated he has shortness of breathe when eating sunflower seeds and oil   Amoxicillin Hives and Palpitations    Patient states he had an allergic reaction to this medication in the past.    Prilosec [Omeprazole] Other (See Comments)    Pt states he can't breath too well    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   Sickle cell anemia (HCC)    Thrombocytopenia (HCC) 12/2018   Vitamin D deficiency 12/2018     Past Surgical History:  Procedure Laterality Date   DENTAL SURGERY      Family History  Problem Relation Age of Onset   Hypertension Mother    Hypertension Father    Sickle cell anemia Brother     Social History   Tobacco Use  Smoking status: Never   Smokeless tobacco: Never  Vaping Use   Vaping Use: Never used  Substance Use Topics   Alcohol use: Not Currently   Drug use: Never    ROS   Objective:   Vitals: BP 130/74 (BP Location: Left Arm)   Pulse 78   Temp 98.3 F (36.8 C) (Oral)   Resp 18   SpO2 95%   Physical Exam Constitutional:      General: He is not in acute distress.    Appearance: Normal appearance. He is well-developed and normal weight. He is not ill-appearing, toxic-appearing or diaphoretic.  HENT:     Head: Normocephalic and atraumatic.     Right Ear: Tympanic membrane, ear canal and external ear normal. No drainage,  swelling or tenderness. No middle ear effusion. There is no impacted cerumen. Tympanic membrane is not erythematous or bulging.     Left Ear: Tympanic membrane, ear canal and external ear normal. No drainage, swelling or tenderness.  No middle ear effusion. There is no impacted cerumen. Tympanic membrane is not erythematous or bulging.     Nose: Congestion present. No rhinorrhea.     Mouth/Throat:     Mouth: Mucous membranes are moist.     Pharynx: No oropharyngeal exudate or posterior oropharyngeal erythema.  Eyes:     General: No scleral icterus.       Right eye: No discharge.        Left eye: No discharge.     Extraocular Movements: Extraocular movements intact.     Conjunctiva/sclera: Conjunctivae normal.  Cardiovascular:     Rate and Rhythm: Normal rate and regular rhythm.     Heart sounds: Normal heart sounds. No murmur heard.    No friction rub. No gallop.  Pulmonary:     Effort: Pulmonary effort is normal. No respiratory distress.     Breath sounds: Normal breath sounds. No stridor. No wheezing, rhonchi or rales.  Musculoskeletal:     Cervical back: Normal range of motion and neck supple. No rigidity. No muscular tenderness.  Neurological:     General: No focal deficit present.     Mental Status: He is alert and oriented to person, place, and time.  Psychiatric:        Mood and Affect: Mood normal.        Behavior: Behavior normal.        Thought Content: Thought content normal.      Assessment and Plan :   PDMP not reviewed this encounter.  1. Acute viral syndrome   2. Mild intermittent reactive airway disease without complication    Patient did have a complete blood panel done in April and was very reassuring.  X-ray over-read was pending at time of discharge, recommended follow up with only abnormal results. Otherwise will not call for negative over-read. Patient was in agreement.  Recommended an oral prednisone course in the context of his reactive airway.   Otherwise, will manage for viral illness such as viral URI, viral syndrome, viral rhinitis, COVID-19. Recommended supportive care. Offered scripts for symptomatic relief. Testing is pending. Counseled patient on potential for adverse effects with medications prescribed/recommended today, ER and return-to-clinic precautions discussed, patient verbalized understanding.     Wallis Bamberg, [place] 07/12/22 [id number]

## 2022-07-13 LAB — SARS CORONAVIRUS 2 (TAT 6-24 HRS): SARS Coronavirus 2: NEGATIVE

## 2022-08-09 DIAGNOSIS — D571 Sickle-cell disease without crisis: Secondary | ICD-10-CM | POA: Diagnosis not present

## 2022-09-21 DIAGNOSIS — R0789 Other chest pain: Secondary | ICD-10-CM | POA: Diagnosis not present

## 2022-09-21 DIAGNOSIS — Z7901 Long term (current) use of anticoagulants: Secondary | ICD-10-CM | POA: Diagnosis not present

## 2022-09-21 DIAGNOSIS — M25511 Pain in right shoulder: Secondary | ICD-10-CM | POA: Diagnosis not present

## 2022-09-21 DIAGNOSIS — Z86711 Personal history of pulmonary embolism: Secondary | ICD-10-CM | POA: Diagnosis not present

## 2022-09-21 DIAGNOSIS — R0602 Shortness of breath: Secondary | ICD-10-CM | POA: Diagnosis not present

## 2022-09-21 DIAGNOSIS — R079 Chest pain, unspecified: Secondary | ICD-10-CM | POA: Diagnosis not present

## 2022-09-21 DIAGNOSIS — Z79899 Other long term (current) drug therapy: Secondary | ICD-10-CM | POA: Diagnosis not present

## 2022-09-21 DIAGNOSIS — D5700 Hb-SS disease with crisis, unspecified: Secondary | ICD-10-CM | POA: Diagnosis not present

## 2022-10-06 DIAGNOSIS — D57811 Other sickle-cell disorders with acute chest syndrome: Secondary | ICD-10-CM | POA: Diagnosis not present

## 2022-10-06 DIAGNOSIS — R42 Dizziness and giddiness: Secondary | ICD-10-CM | POA: Diagnosis not present

## 2022-10-06 DIAGNOSIS — Z13220 Encounter for screening for lipoid disorders: Secondary | ICD-10-CM | POA: Diagnosis not present

## 2022-10-06 DIAGNOSIS — Z0000 Encounter for general adult medical examination without abnormal findings: Secondary | ICD-10-CM | POA: Diagnosis not present

## 2022-10-06 DIAGNOSIS — R55 Syncope and collapse: Secondary | ICD-10-CM | POA: Diagnosis not present

## 2022-10-10 DIAGNOSIS — F4323 Adjustment disorder with mixed anxiety and depressed mood: Secondary | ICD-10-CM | POA: Diagnosis not present

## 2022-10-19 ENCOUNTER — Ambulatory Visit
Admission: EM | Admit: 2022-10-19 | Discharge: 2022-10-19 | Disposition: A | Discharge status: Home / Self Care | Payer: BC Managed Care – PPO | Attending: Internal Medicine | Admitting: Internal Medicine

## 2022-10-19 ENCOUNTER — Ambulatory Visit (HOSPITAL_BASED_OUTPATIENT_CLINIC_OR_DEPARTMENT_OTHER)
Admission: RE | Admit: 2022-10-19 | Discharge: 2022-10-19 | Disposition: A | Discharge status: Home / Self Care | Payer: BC Managed Care – PPO | Source: Ambulatory Visit | Attending: Urgent Care | Admitting: Urgent Care

## 2022-10-19 DIAGNOSIS — D571 Sickle-cell disease without crisis: Secondary | ICD-10-CM | POA: Diagnosis not present

## 2022-10-19 DIAGNOSIS — R079 Chest pain, unspecified: Secondary | ICD-10-CM | POA: Diagnosis not present

## 2022-10-19 DIAGNOSIS — R0789 Other chest pain: Secondary | ICD-10-CM | POA: Insufficient documentation

## 2022-10-19 HISTORY — DX: Other pulmonary embolism without acute cor pulmonale: I26.99

## 2022-10-19 MED ORDER — CYCLOBENZAPRINE HCL 5 MG PO TABS: 5.0000 mg | ORAL_TABLET | Freq: Three times a day (TID) | ORAL | 0 refills | Status: DC | PRN

## 2022-10-19 MED ORDER — ACETAMINOPHEN 325 MG PO TABS: 650.0000 mg | ORAL_TABLET | Freq: Four times a day (QID) | ORAL | 0 refills | Status: AC | PRN

## 2022-10-19 NOTE — ED Triage Notes (Addendum)
Pt c/o left rib/axilla area pain (when asked to point at pain site) x 10 days-worse with deep breath and movement-denies injury to pain site-denies fever/flu sx-using icy hot to area with some relief/taking tylenol with some relief-NAD-steady gait

## 2022-10-19 NOTE — Discharge Instructions (Addendum)
Hello Emarion,  Unfortunately, I did not get your x-ray results prior to our clinic closing. For now, I recommend managing this for musculoskeletal pain with Tylenol and a muscle relaxant, cyclobenzaprine. We will update you with your x-ray results tomorrow.  Thank you, Urban Gibson

## 2022-10-19 NOTE — ED Provider Notes (Signed)
[facility]  Note:  This document was prepared using Conservation officer, historic buildings and may include unintentional dictation errors.  MRN: [id number] DOB: 06/15/1990  Subjective:   Gregory Espinoza is a 32 y.o. male presenting for 10-day history of persistent moderate left lateral internal chest pain.  No fall, trauma, wheezing, coughing.  He does have intermittent shortness of breath but is not new.  Regarding his sickle cell anemia, reports that it is well-controlled.  Takes hydroxyurea, folic acid and Eliquis.  Gets regular follow-up for this.  There is a history of pulmonary embolism but again is on Eliquis.  Patient walks for exercise, has not been doing so lately.  No contact sports, sports injury, car accident.  No current facility-administered medications for this encounter.  Current Outpatient Medications:    albuterol (VENTOLIN HFA) 108 (90 Base) MCG/ACT inhaler, Inhale 2 puffs into the lungs every 6 (six) hours as needed for wheezing or shortness of breath., Disp: 8 g, Rfl: 0   apixaban (ELIQUIS) 2.5 MG TABS tablet, Take 2.5 mg by mouth 2 (two) times daily., Disp: , Rfl:    cetirizine (ZYRTEC ALLERGY) 10 MG tablet, Take 1 tablet (10 mg total) by mouth daily., Disp: 30 tablet, Rfl: 0   famotidine (PEPCID) 20 MG tablet, Take 1 tablet (20 mg total) by mouth 2 (two) times daily., Disp: 60 tablet, Rfl: 0   folic acid (FOLVITE) 1 MG tablet, Take 1 mg by mouth daily., Disp: , Rfl:    hydroxyurea (HYDREA) 500 MG capsule, Take 500 mg by mouth daily. May take with food to minimize GI side effects., Disp: , Rfl:    Multiple Vitamin (ONE-A-DAY 55 PLUS PO), Take 1 tablet by mouth daily., Disp: , Rfl:    Phenylephrine-DM-GG-APAP (VICKS DAYQUIL SEVERE COLD/FLU) 5-10-200-325 MG TABS, Take by mouth., Disp: , Rfl:    predniSONE (DELTASONE) 20 MG tablet, Take 2 tablets daily with breakfast., Disp: 10 tablet, Rfl: 0   promethazine-dextromethorphan (PROMETHAZINE-DM)  6.25-15 MG/5ML syrup, Take 5 mLs by mouth 3 (three) times daily as needed for cough., Disp: 200 mL, Rfl: 0   Vitamin D, Ergocalciferol, (DRISDOL) 1.25 MG (50000 UNIT) CAPS capsule, Take 1 capsule (50,000 Units total) by mouth every 7 (seven) days. X 12 weeks. (Patient taking differently: Take 50,000 Units by mouth every 30 (thirty) days. X 12 weeks.), Disp: 5 capsule, Rfl: 2   Allergies  Allergen Reactions   Peanut Butter Flavor Anaphylaxis    Pt stated he has shortness of breathe when eating peanuts and peanut butter   Sunflower Oil Anaphylaxis    Pt stated he has shortness of breathe when eating sunflower seeds and oil   Amoxicillin Hives and Palpitations    Patient states he had an allergic reaction to this medication in the past.    Prilosec [Omeprazole] Other (See Comments)    Pt states he can't breath too well    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   Pulmonary embolism (HCC)    per pt   Sickle cell anemia (HCC)    Thrombocytopenia (HCC) 12/2018   Vitamin D deficiency 12/2018     Past Surgical History:  Procedure Laterality Date   DENTAL SURGERY      Family History  Problem Relation Age of Onset   Hypertension Mother    Hypertension Father    Sickle cell anemia  Brother     Social History   Tobacco Use   Smoking status: Never   Smokeless tobacco: Never  Vaping Use   Vaping status: Never Used  Substance Use Topics   Alcohol use: Not Currently   Drug use: Never    ROS   Objective:   Vitals: BP 130/69 (BP Location: Right Arm)   Pulse 78   Temp 98.3 F (36.8 C)   Resp 20   SpO2 94%   Physical Exam Constitutional:      General: He is not in acute distress.    Appearance: Normal appearance. He is well-developed. He is not ill-appearing, toxic-appearing or diaphoretic.  HENT:     Head: Normocephalic and atraumatic.     Right Ear: External ear  normal.     Left Ear: External ear normal.     Nose: Nose normal.     Mouth/Throat:     Mouth: Mucous membranes are moist.  Eyes:     General: No scleral icterus.       Right eye: No discharge.        Left eye: No discharge.     Extraocular Movements: Extraocular movements intact.  Cardiovascular:     Rate and Rhythm: Normal rate and regular rhythm.     Heart sounds: Normal heart sounds. No murmur heard.    No friction rub. No gallop.  Pulmonary:     Effort: Pulmonary effort is normal. No respiratory distress.     Breath sounds: Normal breath sounds. No stridor. No wheezing, rhonchi or rales.  Neurological:     Mental Status: He is alert and oriented to person, place, and time.  Psychiatric:        Mood and Affect: Mood normal.        Behavior: Behavior normal.        Thought Content: Thought content normal.     ED ECG REPORT   Date: 10/19/2022  EKG Time: 6:28 PM  Rate: 73bpm  Rhythm: normal sinus rhythm,  unchanged from previous tracings  Axis: normal  Intervals:none  ST&T Change: none  Narrative Interpretation: sinus rhythm at 73bpm, no acute findings and comparable to previous EKG.   Assessment and Plan :   PDMP not reviewed this encounter.  1. Atypical chest pain   2. Sickle cell disease without crisis [facility])    Will pursue outpatient imaging, x-ray order placed.  Low suspicion for pulmonary embolism, patient is on Eliquis as well.  Will defer ER visit for now.  Will update patient on treatment plan and diagnoses once I receive the overread from the radiology department.   Wallis Bamberg, [place] 10/19/22 1925

## 2022-10-27 DIAGNOSIS — M25551 Pain in right hip: Secondary | ICD-10-CM | POA: Diagnosis not present

## 2022-10-27 DIAGNOSIS — Z0000 Encounter for general adult medical examination without abnormal findings: Secondary | ICD-10-CM | POA: Diagnosis not present

## 2022-10-27 DIAGNOSIS — D571 Sickle-cell disease without crisis: Secondary | ICD-10-CM | POA: Diagnosis not present

## 2022-10-27 DIAGNOSIS — Z23 Encounter for immunization: Secondary | ICD-10-CM | POA: Diagnosis not present

## 2022-10-30 DIAGNOSIS — D571 Sickle-cell disease without crisis: Secondary | ICD-10-CM | POA: Diagnosis not present

## 2022-11-06 DIAGNOSIS — E559 Vitamin D deficiency, unspecified: Secondary | ICD-10-CM | POA: Diagnosis not present

## 2022-11-06 DIAGNOSIS — Z79899 Other long term (current) drug therapy: Secondary | ICD-10-CM | POA: Diagnosis not present

## 2022-11-06 DIAGNOSIS — D571 Sickle-cell disease without crisis: Secondary | ICD-10-CM | POA: Diagnosis not present

## 2022-11-06 DIAGNOSIS — I493 Ventricular premature depolarization: Secondary | ICD-10-CM | POA: Diagnosis not present

## 2022-11-06 DIAGNOSIS — E611 Iron deficiency: Secondary | ICD-10-CM | POA: Diagnosis not present

## 2022-11-06 DIAGNOSIS — Z86711 Personal history of pulmonary embolism: Secondary | ICD-10-CM | POA: Diagnosis not present

## 2022-11-06 DIAGNOSIS — I2693 Single subsegmental pulmonary embolism without acute cor pulmonale: Secondary | ICD-10-CM | POA: Diagnosis not present

## 2022-11-06 DIAGNOSIS — Z7901 Long term (current) use of anticoagulants: Secondary | ICD-10-CM | POA: Diagnosis not present

## 2022-11-15 DIAGNOSIS — D571 Sickle-cell disease without crisis: Secondary | ICD-10-CM | POA: Diagnosis not present

## 2022-11-15 DIAGNOSIS — D5701 Hb-SS disease with acute chest syndrome: Secondary | ICD-10-CM | POA: Diagnosis not present

## 2022-11-27 ENCOUNTER — Ambulatory Visit: Payer: Self-pay | Admitting: Nurse Practitioner

## 2022-11-27 ENCOUNTER — Ambulatory Visit
Admission: EM | Admit: 2022-11-27 | Discharge: 2022-11-27 | Disposition: A | Discharge status: Home / Self Care | Payer: BC Managed Care – PPO | Attending: Emergency Medicine | Admitting: Emergency Medicine

## 2022-11-27 DIAGNOSIS — R0600 Dyspnea, unspecified: Secondary | ICD-10-CM

## 2022-11-27 DIAGNOSIS — R079 Chest pain, unspecified: Secondary | ICD-10-CM

## 2022-11-27 DIAGNOSIS — M62830 Muscle spasm of back: Secondary | ICD-10-CM | POA: Diagnosis not present

## 2022-11-27 MED ORDER — BACLOFEN 10 MG PO TABS
5.0000 mg | ORAL_TABLET | Freq: Three times a day (TID) | ORAL | 0 refills | Status: AC | PRN
Start: 2022-11-27 — End: ?

## 2022-11-27 NOTE — ED Triage Notes (Signed)
Pt presents to UC w/ c/o left sided chest pain, back pain, sob, dry cough "starting early October." Pt reports he recently noticed a "bulge on his left side."

## 2022-11-27 NOTE — Discharge Instructions (Signed)
Take medications as prescribed, as needed, to help with likely muscle spasms causing your new left mid back pain. The medication may cause drowsiness.  No abnormalities seen on your EKG. Given minimal change in symptoms from the ones you've been having for the last month and already evaluated with a CT, there is not any additional work-up we can do here in urgent care. If symptoms worsen, go to the ER.

## 2022-11-27 NOTE — ED Provider Notes (Signed)
[facility]    CSN: [id number] Arrival date & time: 11/27/22  1229      History   Chief Complaint Chief Complaint  Patient presents with   Chest Pain    HPI Gregory Espinoza is a 32 y.o. male.   Patient presents with concerns of central and left-sided chest pain and shortness of breath. He reports the symptoms are pretty constant, but worsen when he is walking or when laying down. The patient was seen here on 9/19 and subsequently seen by heme/onc, who manages his sickle cell anemia, for the same symptoms. He reports his current symptoms are about the same as when he was seen previously. He had a chest CT negative for PE on 10/7. He has history of prior PE and takes Eliquis. The patient denies fever. He states he has occasional dry cough that also worsens his chest discomfort. He has not taken anything for these symptoms.  The patient states since yesterday he also developed left mid back pain. He states this pain occurs when he makes certain movements or is in certain positions, but then improves after returning to a comfortable position. He denies injury to the area. He had been prescribed Flexeril when seen here on 9/19 but states he never took it. He did try Tylenol with minimal improvement.   The history is provided by the patient.  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   Pulmonary embolism (HCC)    per pt   Sickle cell anemia (HCC)    Thrombocytopenia (HCC) 12/2018   Vitamin D deficiency 12/2018    Patient Active Problem List   Diagnosis Date Noted   Gastroesophageal reflux disease without esophagitis 05/29/2022   History of COVID-19 04/12/2022   Screening  examination for STD (sexually transmitted disease) 02/10/2022   Routine adult health maintenance 08/26/2021   Shortness of breath 05/04/2021   Abdominal pain, right lower quadrant 12/19/2018   Absolute anemia 12/13/2018   Hb-SS disease with acute chest syndrome (HCC) 10/24/2018   Transaminitis 03/13/2018   Sickle cell disease, type SS (HCC) 04/24/2014    Past Surgical History:  Procedure Laterality Date   DENTAL SURGERY         Home Medications    Prior to Admission medications   Medication Sig Start Date End Date Taking? Authorizing Provider  baclofen (LIORESAL) 10 MG tablet Take 0.5-1 tablets (5-10 mg total) by mouth 3 (three) times daily as needed for muscle spasms. 11/27/22  Yes Alizon Schmeling L, PA  acetaminophen (TYLENOL) 325 MG tablet Take 2 tablets (650 mg total) by mouth every 6 (six) hours as needed for moderate pain. 10/19/22   Wallis Bamberg, PA-C  albuterol (VENTOLIN HFA) 108 (90 Base) MCG/ACT inhaler Inhale 2 puffs into the lungs every 6 (six) hours as needed for wheezing or shortness of breath. 03/31/22   Ivonne Andrew, NP  apixaban (ELIQUIS) 2.5 MG TABS tablet Take 2.5 mg by mouth 2 (two) times daily.    [provider]  cetirizine (ZYRTEC ALLERGY) 10 MG tablet Take 1 tablet (10 mg total) by mouth daily. 07/12/22   Wallis Bamberg, PA-C  famotidine (PEPCID) 20 MG tablet Take 1  tablet (20 mg total) by mouth 2 (two) times daily. 05/29/22 06/28/22  Ivonne Andrew, NP  folic acid (FOLVITE) 1 MG tablet Take 1 mg by mouth daily. 12/05/17   [provider]  hydroxyurea (HYDREA) 500 MG capsule Take 500 mg by mouth daily. May take with food to minimize GI side effects.    [provider]  Multiple Vitamin (ONE-A-DAY 55 PLUS PO) Take 1 tablet by mouth daily.    [provider]  Phenylephrine-DM-GG-APAP (VICKS DAYQUIL SEVERE COLD/FLU) 5-10-200-325 MG TABS Take by mouth.    [provider]  predniSONE (DELTASONE) 20 MG tablet Take 2 tablets daily  with breakfast. 07/12/22   Wallis Bamberg, PA-C  promethazine-dextromethorphan (PROMETHAZINE-DM) 6.25-15 MG/5ML syrup Take 5 mLs by mouth 3 (three) times daily as needed for cough. 07/12/22   Wallis Bamberg, PA-C  Vitamin D, Ergocalciferol, (DRISDOL) 1.25 MG (50000 UNIT) CAPS capsule Take 1 capsule (50,000 Units total) by mouth every 7 (seven) days. X 12 weeks. Patient taking differently: Take 50,000 Units by mouth every 30 (thirty) days. X 12 weeks. 02/06/20   Kallie Locks, FNP    Family History Family History  Problem Relation Age of Onset   Hypertension Mother    Hypertension Father    Sickle cell anemia Brother     Social History Social History   Tobacco Use   Smoking status: Never   Smokeless tobacco: Never  Vaping Use   Vaping status: Never Used  Substance Use Topics   Alcohol use: Not Currently   Drug use: Never     Allergies   Peanut butter flavor, Sunflower oil, Amoxicillin, and Prilosec [omeprazole]   Review of Systems Review of Systems  Constitutional:  Negative for fatigue and fever.  HENT:  Negative for congestion.   Respiratory:  Positive for cough and shortness of breath. Negative for wheezing.   Cardiovascular:  Positive for chest pain.  Gastrointestinal:  Negative for abdominal pain, nausea and vomiting.  Genitourinary:  Negative for dysuria.  Musculoskeletal:  Positive for back pain. Negative for arthralgias.  Skin:  Negative for rash.  Neurological:  Negative for syncope, weakness and headaches.     Physical Exam Triage Vital Signs ED Triage Vitals  Encounter Vitals Group     BP 11/27/22 1249 120/73     Systolic BP Percentile --      Diastolic BP Percentile --      Pulse Rate 11/27/22 1249 77     Resp 11/27/22 1249 16     Temp 11/27/22 1249 97.8 F (36.6 C)     Temp Source 11/27/22 1249 Oral     SpO2 11/27/22 1249 93 %     Weight --      Height --      Head Circumference --      Peak Flow --      Pain Score 11/27/22 1248 7     Pain Loc --       Pain Education --      Exclude from Growth Chart --    No data found.  Updated Vital Signs BP 120/73 (BP Location: Right Arm)   Pulse 77   Temp 97.8 F (36.6 C) (Oral)   Resp 16   SpO2 93%   Visual Acuity Right Eye Distance:   Left Eye Distance:   Bilateral Distance:    Right Eye Near:   Left Eye Near:    Bilateral Near:     Physical Exam Vitals and nursing note reviewed.  Constitutional:      General: He is not in acute distress. HENT:     Head: Normocephalic.     Mouth/Throat:     Mouth: Mucous membranes are moist.  Eyes:     Pupils: Pupils are equal, round, and reactive to light.  Cardiovascular:     Rate and Rhythm: Normal rate and regular rhythm.     Heart sounds: Normal heart sounds.  Pulmonary:     Effort: Pulmonary effort is normal.     Breath sounds: Normal breath sounds.  Chest:     Comments: Indicates sternum and left lower ribcage extending to lateral ribs is location of chest pain. No tenderness to palpation. Musculoskeletal:     Cervical back: Normal range of motion.     Comments: Left mid thoracic musculature just under scapula indicated as area of pain, with minimal tenderness.   Skin:    Findings: No rash.  Neurological:     Mental Status: He is alert.  Psychiatric:        Mood and Affect: Mood normal.      UC Treatments / Results  Labs (all labs ordered are listed, but only abnormal results are displayed) Labs Reviewed - No data to display  EKG   Radiology No results found.  Procedures ED EKG  Date/Time: 11/27/2022 1:04 PM  Performed by: Estanislado Pandy, PA Authorized by: Vallery Sa, Cashay Manganelli L, PA   Previous ECG:    Previous ECG:  Compared to current   Similarity:  No change Interpretation:    Interpretation: non-specific   Rate:    ECG rate assessment: normal   Rhythm:    Rhythm: sinus rhythm   Ectopy:    Ectopy: none   QRS:    QRS axis:  Normal   QRS intervals:  Normal   QRS conduction: normal   T waves:    T waves:  normal   Comments:     Slight abnl to V3 and V4 ST, present to same degree in prior EKG from 10/19/22  (including critical care time)  Medications Ordered in UC Medications - No data to display  Initial Impression / Assessment and Plan / UC Course  I have reviewed the triage vital signs and the nursing notes.  Pertinent labs & imaging results that were available during my care of the patient were reviewed by me and considered in my medical decision making (see chart for details).     Chest pain and SOB relatively unchanged over past month since seen here and by heme/onc, negative chest CT. No evidence of acute changes to indicate necessity of repeat stat imaging. Discussed with patient limited work-up available in urgent care. Encouraged he follow-up again with PCP, heme/onc, and pulm as referred by heme/onc. Advised to go to the ER if any worsening.  New left thoracic discomfort consistent with MSK as reproducible with certain movements and positions. Trial muscle relaxer and other conservative tx. Discussed ER precautions.   E/M: 1 complicated illness, no data, moderate risk due to prescription management   Final Clinical Impressions(s) / UC Diagnoses   Final diagnoses:  Chest pain, unspecified type  Dyspnea, unspecified type  Spasm of thoracic back muscle     Discharge Instructions      Take medications as prescribed, as needed, to help with likely muscle spasms causing your new left mid back pain. The medication may cause drowsiness.  No abnormalities seen on your EKG. Given minimal change in symptoms from the ones you've been  having for the last month and already evaluated with a CT, there is not any additional work-up we can do here in urgent care. If symptoms worsen, go to the ER.      ED Prescriptions     Medication Sig Dispense Auth. Provider   baclofen (LIORESAL) 10 MG tablet Take 0.5-1 tablets (5-10 mg total) by mouth 3 (three) times daily as needed for muscle  spasms. 30 each Vallery Sa, Videl Nobrega L, PA      PDMP not reviewed this encounter.   Estanislado Pandy, [place] 11/27/22 1325

## 2022-11-30 ENCOUNTER — Encounter: Payer: Self-pay | Admitting: Nurse Practitioner

## 2022-11-30 ENCOUNTER — Ambulatory Visit: Payer: BC Managed Care – PPO | Admitting: Nurse Practitioner

## 2022-11-30 VITALS — BP 133/71 | HR 77 | Ht 78.0 in | Wt 222.0 lb

## 2022-11-30 DIAGNOSIS — Z113 Encounter for screening for infections with a predominantly sexual mode of transmission: Secondary | ICD-10-CM

## 2022-11-30 NOTE — Progress Notes (Signed)
Subjective   Patient ID: Gregory Espinoza, male    DOB: 1991/01/21, 32 y.o.   MRN: [id number]  Chief Complaint  Patient presents with   Follow-up    Referring provider: Ivonne Andrew, NP  Girtha Hake Rojero is a 32 y.o. 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  Overall he has been doing well.  He was recently seen by [facility].  He has been in the hospital a few times for shortness of breath and chest pain.  Hematology has referred him to pulmonary for pulmonary function test.  He has also noticed been seen for gene therapy through [facility].  He does plan on proceeding with treatment for sickle cell next year. Denies f/c/s, n/v/d, hemoptysis, PND, leg swelling Denies chest pain or edema     Allergies  Allergen Reactions   Peanut Butter Flavor Anaphylaxis    Pt stated he has shortness of breathe when eating peanuts and peanut butter   Sunflower Oil Anaphylaxis    Pt stated he has shortness of breathe when eating sunflower seeds and oil   Amoxicillin Hives and Palpitations    Patient states he had an allergic reaction to this medication in the past.    Prilosec [Omeprazole] Other (See Comments)    Pt states he can't breath too well    Immunization History  Administered Date(s) Administered   Influenza,inj,Quad PF,6+ Mos 03/14/2018, 11/07/2018, 11/10/2020, 11/25/2021   Influenza,inj,Quad PF,6-35 Mos 11/10/2019   Influenza-Unspecified 03/14/2018   Meningococcal Conjugate 04/27/2014   Pneumococcal Conjugate-13 04/27/2014   Pneumococcal Polysaccharide-23 03/14/2018   Rabies, IM 10/04/2020, 10/07/2020, 10/11/2020, 10/22/2020     Tobacco History: Social History   Tobacco Use  Smoking Status Never  Smokeless Tobacco Never   Counseling given: Not Answered   Outpatient Encounter Medications as of 11/30/2022  Medication Sig   acetaminophen (TYLENOL) 325 MG tablet Take 2 tablets (650 mg total) by mouth every 6 (six) hours as needed for moderate pain.   albuterol (VENTOLIN HFA) 108 (90 Base) MCG/ACT inhaler Inhale 2 puffs into the lungs every 6 (six) hours as needed for wheezing or shortness of breath.   apixaban (ELIQUIS) 2.5 MG TABS tablet Take 2.5 mg by mouth 2 (two) times daily.   baclofen (LIORESAL) 10 MG tablet Take 0.5-1 tablets (5-10 mg total) by mouth 3 (three) times daily as needed for muscle spasms.   cetirizine (ZYRTEC ALLERGY) 10 MG tablet Take 1 tablet (10 mg total) by mouth daily.   folic acid (FOLVITE) 1 MG tablet Take 1 mg by mouth daily.   hydroxyurea (HYDREA) 500 MG capsule Take 500 mg by mouth daily. May take with food to minimize GI side effects.   Multiple Vitamin (ONE-A-DAY 55 PLUS PO) Take 1 tablet by mouth daily.   Phenylephrine-DM-GG-APAP (VICKS DAYQUIL SEVERE COLD/FLU) 5-10-200-325 MG TABS Take by mouth.   predniSONE (DELTASONE) 20 MG tablet Take 2 tablets daily with breakfast.   promethazine-dextromethorphan (PROMETHAZINE-DM) 6.25-15 MG/5ML syrup Take 5 mLs by mouth 3 (three) times daily as needed for cough.   Vitamin D, Ergocalciferol, (DRISDOL) 1.25 MG (50000 UNIT) CAPS capsule Take 1 capsule (50,000 Units total) by mouth every 7 (seven) days. X  12 weeks. (Patient taking differently: Take 50,000 Units by mouth every 30 (thirty) days. X 12 weeks.)   famotidine (PEPCID) 20 MG tablet Take 1 tablet (20 mg total) by mouth 2 (two) times daily.   No facility-administered encounter medications on file as of 11/30/2022.    Review of Systems  Review of Systems  Constitutional: Negative.   HENT: Negative.    Cardiovascular: Negative.   Gastrointestinal: Negative.   Allergic/Immunologic:  Negative.   Neurological: Negative.   Psychiatric/Behavioral: Negative.       Objective:   BP 133/71 (BP Location: Left Arm, Patient Position: Sitting, Cuff Size: Large)   Pulse 77   Ht 6\' 6"  (1.981 m)   Wt 222 lb (100.7 kg)   SpO2 94%   BMI 25.65 kg/m   Wt Readings from Last 5 Encounters:  11/30/22 222 lb (100.7 kg)  05/29/22 219 lb 6.4 oz (99.5 kg)  03/31/22 219 lb 3.2 oz (99.4 kg)  02/09/22 217 lb 6.4 oz (98.6 kg)  11/25/21 213 lb (96.6 kg)     Physical Exam Vitals and nursing note reviewed.  Constitutional:      General: He is not in acute distress.    Appearance: He is well-developed.  Cardiovascular:     Rate and Rhythm: Normal rate and regular rhythm.  Pulmonary:     Effort: Pulmonary effort is normal.     Breath sounds: Normal breath sounds.  Skin:    General: Skin is warm and dry.  Neurological:     Mental Status: He is alert and oriented to person, place, and time.       Assessment & Plan:   Screen for STD (sexually transmitted disease) -     RPR+HIV+GC+CT Panel -     Chlamydia/Gonococcus/Trichomonas, NAA     Return in about 3 months (around 03/02/2023).   Ivonne Andrew, NP 11/30/2022

## 2022-12-04 DIAGNOSIS — R942 Abnormal results of pulmonary function studies: Secondary | ICD-10-CM | POA: Diagnosis not present

## 2022-12-04 DIAGNOSIS — D571 Sickle-cell disease without crisis: Secondary | ICD-10-CM | POA: Diagnosis not present

## 2022-12-04 DIAGNOSIS — F4323 Adjustment disorder with mixed anxiety and depressed mood: Secondary | ICD-10-CM | POA: Diagnosis not present

## 2022-12-04 DIAGNOSIS — R0602 Shortness of breath: Secondary | ICD-10-CM | POA: Diagnosis not present

## 2022-12-04 LAB — CHLAMYDIA/GONOCOCCUS/TRICHOMONAS, NAA
Chlamydia by NAA: NEGATIVE
Gonococcus by NAA: NEGATIVE
Trich vag by NAA: NEGATIVE

## 2022-12-04 LAB — RPR+HIV+GC+CT PANEL
Chlamydia trachomatis, NAA: NEGATIVE
HIV Screen 4th Generation wRfx: NONREACTIVE
Neisseria Gonorrhoeae by PCR: NEGATIVE
RPR Ser Ql: NONREACTIVE

## 2022-12-05 DIAGNOSIS — F4321 Adjustment disorder with depressed mood: Secondary | ICD-10-CM | POA: Diagnosis not present

## 2022-12-07 DIAGNOSIS — H3523 Other non-diabetic proliferative retinopathy, bilateral: Secondary | ICD-10-CM | POA: Diagnosis not present

## 2022-12-07 DIAGNOSIS — Z01818 Encounter for other preprocedural examination: Secondary | ICD-10-CM | POA: Diagnosis not present

## 2022-12-07 DIAGNOSIS — D5742 Sickle-cell thalassemia beta zero without crisis: Secondary | ICD-10-CM | POA: Diagnosis not present

## 2022-12-07 DIAGNOSIS — Z86711 Personal history of pulmonary embolism: Secondary | ICD-10-CM | POA: Diagnosis not present

## 2022-12-07 DIAGNOSIS — D571 Sickle-cell disease without crisis: Secondary | ICD-10-CM | POA: Diagnosis not present

## 2022-12-11 DIAGNOSIS — J984 Other disorders of lung: Secondary | ICD-10-CM | POA: Diagnosis not present

## 2023-01-01 DIAGNOSIS — F4323 Adjustment disorder with mixed anxiety and depressed mood: Secondary | ICD-10-CM | POA: Diagnosis not present

## 2023-01-03 DIAGNOSIS — R942 Abnormal results of pulmonary function studies: Secondary | ICD-10-CM | POA: Diagnosis not present

## 2023-01-03 DIAGNOSIS — R0602 Shortness of breath: Secondary | ICD-10-CM | POA: Diagnosis not present

## 2023-01-03 DIAGNOSIS — J984 Other disorders of lung: Secondary | ICD-10-CM | POA: Diagnosis not present

## 2023-01-05 DIAGNOSIS — F4321 Adjustment disorder with depressed mood: Secondary | ICD-10-CM | POA: Diagnosis not present

## 2023-01-22 DIAGNOSIS — I272 Pulmonary hypertension, unspecified: Secondary | ICD-10-CM | POA: Diagnosis not present

## 2023-01-22 DIAGNOSIS — I517 Cardiomegaly: Secondary | ICD-10-CM | POA: Diagnosis not present

## 2023-02-05 DIAGNOSIS — F4323 Adjustment disorder with mixed anxiety and depressed mood: Secondary | ICD-10-CM | POA: Diagnosis not present

## 2023-02-06 DIAGNOSIS — D571 Sickle-cell disease without crisis: Secondary | ICD-10-CM | POA: Diagnosis not present

## 2023-02-12 DIAGNOSIS — D571 Sickle-cell disease without crisis: Secondary | ICD-10-CM | POA: Diagnosis not present

## 2023-02-12 DIAGNOSIS — E559 Vitamin D deficiency, unspecified: Secondary | ICD-10-CM | POA: Diagnosis not present

## 2023-02-12 DIAGNOSIS — D5701 Hb-SS disease with acute chest syndrome: Secondary | ICD-10-CM | POA: Diagnosis not present

## 2023-02-12 DIAGNOSIS — I2699 Other pulmonary embolism without acute cor pulmonale: Secondary | ICD-10-CM | POA: Diagnosis not present

## 2023-02-23 DIAGNOSIS — J4540 Moderate persistent asthma, uncomplicated: Secondary | ICD-10-CM | POA: Diagnosis not present

## 2023-02-23 DIAGNOSIS — R0602 Shortness of breath: Secondary | ICD-10-CM | POA: Diagnosis not present

## 2023-02-23 DIAGNOSIS — J984 Other disorders of lung: Secondary | ICD-10-CM | POA: Diagnosis not present

## 2023-02-28 DIAGNOSIS — F4321 Adjustment disorder with depressed mood: Secondary | ICD-10-CM | POA: Diagnosis not present

## 2023-03-01 ENCOUNTER — Encounter: Payer: Self-pay | Admitting: Nurse Practitioner

## 2023-03-02 ENCOUNTER — Ambulatory Visit: Payer: Self-pay | Admitting: Nurse Practitioner

## 2023-03-15 DIAGNOSIS — F4321 Adjustment disorder with depressed mood: Secondary | ICD-10-CM | POA: Diagnosis not present

## 2023-03-26 DIAGNOSIS — G459 Transient cerebral ischemic attack, unspecified: Secondary | ICD-10-CM | POA: Diagnosis not present

## 2023-03-26 DIAGNOSIS — R0789 Other chest pain: Secondary | ICD-10-CM | POA: Diagnosis not present

## 2023-03-26 DIAGNOSIS — Z7901 Long term (current) use of anticoagulants: Secondary | ICD-10-CM | POA: Diagnosis not present

## 2023-03-26 DIAGNOSIS — M25552 Pain in left hip: Secondary | ICD-10-CM | POA: Diagnosis not present

## 2023-03-26 DIAGNOSIS — G8929 Other chronic pain: Secondary | ICD-10-CM | POA: Diagnosis not present

## 2023-03-26 DIAGNOSIS — D5709 Hb-ss disease with crisis with other specified complication: Secondary | ICD-10-CM | POA: Diagnosis not present

## 2023-03-26 DIAGNOSIS — J4540 Moderate persistent asthma, uncomplicated: Secondary | ICD-10-CM | POA: Diagnosis not present

## 2023-03-26 DIAGNOSIS — Z79899 Other long term (current) drug therapy: Secondary | ICD-10-CM | POA: Diagnosis not present

## 2023-03-26 DIAGNOSIS — I5030 Unspecified diastolic (congestive) heart failure: Secondary | ICD-10-CM | POA: Diagnosis not present

## 2023-03-26 DIAGNOSIS — I340 Nonrheumatic mitral (valve) insufficiency: Secondary | ICD-10-CM | POA: Diagnosis not present

## 2023-03-26 DIAGNOSIS — Z7982 Long term (current) use of aspirin: Secondary | ICD-10-CM | POA: Diagnosis not present

## 2023-03-26 DIAGNOSIS — M79605 Pain in left leg: Secondary | ICD-10-CM | POA: Diagnosis not present

## 2023-03-26 DIAGNOSIS — Z86711 Personal history of pulmonary embolism: Secondary | ICD-10-CM | POA: Diagnosis not present

## 2023-03-26 DIAGNOSIS — D5700 Hb-SS disease with crisis, unspecified: Secondary | ICD-10-CM | POA: Diagnosis not present

## 2023-03-26 DIAGNOSIS — D571 Sickle-cell disease without crisis: Secondary | ICD-10-CM | POA: Diagnosis not present

## 2023-03-26 DIAGNOSIS — I517 Cardiomegaly: Secondary | ICD-10-CM | POA: Diagnosis not present

## 2023-03-26 DIAGNOSIS — Z7951 Long term (current) use of inhaled steroids: Secondary | ICD-10-CM | POA: Diagnosis not present

## 2023-03-26 DIAGNOSIS — R531 Weakness: Secondary | ICD-10-CM | POA: Diagnosis not present

## 2023-03-26 DIAGNOSIS — R079 Chest pain, unspecified: Secondary | ICD-10-CM | POA: Diagnosis not present

## 2023-03-26 DIAGNOSIS — I341 Nonrheumatic mitral (valve) prolapse: Secondary | ICD-10-CM | POA: Diagnosis not present

## 2023-03-27 DIAGNOSIS — I341 Nonrheumatic mitral (valve) prolapse: Secondary | ICD-10-CM | POA: Diagnosis not present

## 2023-03-27 DIAGNOSIS — I5030 Unspecified diastolic (congestive) heart failure: Secondary | ICD-10-CM | POA: Diagnosis not present

## 2023-03-27 DIAGNOSIS — I517 Cardiomegaly: Secondary | ICD-10-CM | POA: Diagnosis not present

## 2023-03-27 DIAGNOSIS — I340 Nonrheumatic mitral (valve) insufficiency: Secondary | ICD-10-CM | POA: Diagnosis not present

## 2023-03-27 DIAGNOSIS — R079 Chest pain, unspecified: Secondary | ICD-10-CM | POA: Diagnosis not present

## 2023-03-27 DIAGNOSIS — R0789 Other chest pain: Secondary | ICD-10-CM | POA: Diagnosis not present

## 2023-03-27 DIAGNOSIS — D5709 Hb-ss disease with crisis with other specified complication: Secondary | ICD-10-CM | POA: Diagnosis not present

## 2023-03-27 DIAGNOSIS — G459 Transient cerebral ischemic attack, unspecified: Secondary | ICD-10-CM | POA: Diagnosis not present

## 2023-03-27 DIAGNOSIS — R531 Weakness: Secondary | ICD-10-CM | POA: Diagnosis not present

## 2023-03-27 DIAGNOSIS — M79605 Pain in left leg: Secondary | ICD-10-CM | POA: Diagnosis not present

## 2023-03-28 DIAGNOSIS — R079 Chest pain, unspecified: Secondary | ICD-10-CM | POA: Diagnosis not present

## 2023-03-28 DIAGNOSIS — R0789 Other chest pain: Secondary | ICD-10-CM | POA: Diagnosis not present

## 2023-03-29 DIAGNOSIS — R079 Chest pain, unspecified: Secondary | ICD-10-CM | POA: Diagnosis not present

## 2023-03-29 DIAGNOSIS — M25552 Pain in left hip: Secondary | ICD-10-CM | POA: Diagnosis not present

## 2023-03-30 ENCOUNTER — Telehealth: Payer: Self-pay | Admitting: *Deleted

## 2023-03-30 DIAGNOSIS — R079 Chest pain, unspecified: Secondary | ICD-10-CM | POA: Diagnosis not present

## 2023-03-30 NOTE — Transitions of Care (Post Inpatient/ED Visit) (Signed)
 03/30/2023  Name: Gregory Espinoza MRN: [id number] DOB: 05/17/1990  Today's TOC FU Call Status: Today's TOC FU Call Status:: Successful TOC FU Call Completed TOC FU Call Complete Date: 03/30/23 Patient's Name and Date of Birth confirmed.  Transition Care Management Follow-up Telephone Call Date of Discharge: 03/29/23 Discharge Facility: Other ([facility]) Name of Other ([facility]) Discharge Facility: [facility] Type of Discharge: Inpatient Admission Primary Inpatient Discharge Diagnosis:: Chest pain How have you been since you were released from the hospital?:  (pt denies chest pain, states " my legs feel much better, not weak now",  eating, drinking well, no issues bowel/ bladder, ambulating without difficulty) Any questions or concerns?: No  Items Reviewed: Did you receive and understand the discharge instructions provided?: Yes Medications obtained,verified, and reconciled?: Yes (Medications Reviewed) Any new allergies since your discharge?: No Dietary orders reviewed?: Yes Type of Diet Ordered:: heart healthy Do you have support at home?: Yes People in Home: parent(s) Name of Support/Comfort Primary Source: pt states he lives with his parents Patient declines enrollment in TOC 30 day program Reviewed calling 911 for chest pain, unrelieved shortness of breath, pt verbalizes understanding  Medications Reviewed Today: Medications Reviewed Today     Reviewed by Audrie Gallus, RN (Registered Nurse) on 03/30/23 at 1445  Med List Status: <None>   Medication Order Taking? Sig Documenting Provider Last Dose Status Informant  acetaminophen (TYLENOL) 325 MG tablet [id number] Yes Take 2 tablets (650 mg total) by mouth every 6 (six) hours as needed for moderate pain. Wallis Bamberg, PA-C Taking Active   albuterol (VENTOLIN HFA) 108 (90 Base) MCG/ACT inhaler [id number] Yes Inhale 2 puffs into the lungs every 6 (six) hours as needed for wheezing or shortness of breath.  Ivonne Andrew, NP Taking Active   apixaban (ELIQUIS) 2.5 MG TABS tablet [id number] Yes Take 2.5 mg by mouth 2 (two) times daily. [provider] Taking Active Self  baclofen (LIORESAL) 10 MG tablet [id number] No Take 0.5-1 tablets (5-10 mg total) by mouth 3 (three) times daily as needed for muscle spasms.  Patient not taking: Reported on 03/30/2023   Estanislado Pandy, [place] Not Taking Active   cetirizine (ZYRTEC ALLERGY) 10 MG tablet [id number] Yes Take 1 tablet (10 mg total) by mouth daily. Wallis Bamberg, PA-C Taking Active   famotidine (PEPCID) 20 MG tablet [id number]  Take 1 tablet (20 mg total) by mouth 2 (two) times daily. Ivonne Andrew, NP  Expired 06/28/22 2359   folic acid (FOLVITE) 1 MG tablet [id number] Yes Take 1 mg by mouth daily. [provider] Taking Active Self  hydroxyurea (HYDREA) 500 MG capsule [id number] Yes Take 500 mg by mouth daily. May take with food to minimize GI side effects. [provider] Taking Active Self           Med Note Kallie Locks   Wed Jan 08, 2019  1:45 PM) Taking 1.5 grams every T, R, Sat, and Sun.  Taking 2 grams every M, W, and Fri.  Multiple Vitamin (ONE-A-DAY 55 PLUS PO) [id number] Yes Take 1 tablet by mouth daily. [provider] Taking Active Self  Phenylephrine-DM-GG-APAP (VICKS DAYQUIL SEVERE COLD/FLU) 5-10-200-325 MG TABS [id number] No Take by mouth.  Patient not taking: Reported on 03/30/2023   [provider] Not Taking Active   predniSONE (DELTASONE) 20 MG tablet [id number] No Take 2 tablets daily with breakfast.  Patient not taking: Reported on 03/30/2023   Wallis Bamberg, PA-C Not Taking Active  promethazine-dextromethorphan (PROMETHAZINE-DM) 6.25-15 MG/5ML syrup [id number] No Take 5 mLs by mouth 3 (three) times daily as needed for cough.  Patient not taking: Reported on 03/30/2023   Wallis Bamberg, PA-C Not Taking Active   Vitamin D, Ergocalciferol, (DRISDOL) 1.25 MG (50000 UNIT) CAPS capsule [id number] Yes Take  1 capsule (50,000 Units total) by mouth every 7 (seven) days. X 12 weeks.  Patient taking differently: Take 50,000 Units by mouth every 30 (thirty) days. X 12 weeks.   Kallie Locks, FNP Taking Active Self            Home Care and Equipment/Supplies: Were Home Health Services Ordered?: No Any new equipment or medical supplies ordered?: No  Functional Questionnaire: Do you need assistance with bathing/showering or dressing?: No Do you need assistance with meal preparation?: No Do you need assistance with eating?: No Do you have difficulty maintaining continence: No Do you need assistance with getting out of bed/getting out of a chair/moving?: No Do you have difficulty managing or taking your medications?: No  Follow up appointments reviewed: PCP Follow-up appointment confirmed?: Yes Date of PCP follow-up appointment?: 04/02/23 Follow-up Provider: Angus Seller NP  @ 340 pm Specialist Hospital Follow-up appointment confirmed?: No Reason Specialist Follow-Up Not Confirmed: Patient has Specialist Provider Number and will Call for Appointment Do you need transportation to your follow-up appointment?: No Do you understand care options if your condition(s) worsen?: Yes-patient verbalized understanding  SDOH Interventions Today    Flowsheet Row Most Recent Value  SDOH Interventions   Food Insecurity Interventions Intervention Not Indicated  Housing Interventions Intervention Not Indicated  Transportation Interventions Intervention Not Indicated  Utilities Interventions Intervention Not Indicated       Irving Shows [facility], BSN RN Care Manager/ Transition of Care [place]/ [facility] Population Health [phone]

## 2023-04-02 ENCOUNTER — Encounter: Payer: Self-pay | Admitting: Nurse Practitioner

## 2023-04-02 ENCOUNTER — Ambulatory Visit: Payer: Self-pay | Admitting: Nurse Practitioner

## 2023-04-02 VITALS — BP 109/64 | HR 77 | Temp 97.9°F | Wt 222.6 lb

## 2023-04-02 DIAGNOSIS — Z1329 Encounter for screening for other suspected endocrine disorder: Secondary | ICD-10-CM | POA: Diagnosis not present

## 2023-04-02 DIAGNOSIS — E569 Vitamin deficiency, unspecified: Secondary | ICD-10-CM

## 2023-04-02 DIAGNOSIS — Z13228 Encounter for screening for other metabolic disorders: Secondary | ICD-10-CM | POA: Diagnosis not present

## 2023-04-02 DIAGNOSIS — D571 Sickle-cell disease without crisis: Secondary | ICD-10-CM | POA: Diagnosis not present

## 2023-04-02 MED ORDER — VITAMIN D (ERGOCALCIFEROL) 1.25 MG (50000 UNIT) PO CAPS: 50000.0000 [IU] | ORAL_CAPSULE | ORAL | 2 refills | Status: DC

## 2023-04-02 NOTE — Progress Notes (Signed)
 Subjective   Patient ID: Gregory Espinoza, male    DOB: 04/08/1990, 33 y.o.   MRN: [id number]  Chief Complaint  Patient presents with   Medical Management of Chronic Issues    Referring provider: Ivonne Andrew, NP  Gregory Espinoza is a 33 y.o. 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  Overall he has been doing well.  He was recently [facility].  He has been in the hospital a few times for shortness of breath and chest pain.  Hematology has referred him to pulmonary for pulmonary function test.  He has also noticed been seen for gene therapy through [facility].  He does plan on proceeding with treatment for sickle cell next year. Denies f/c/s, n/v/d, hemoptysis, PND, leg swelling. Denies chest pain or edema.     Allergies  Allergen Reactions   Peanut Butter Flavoring Agent (Non-Screening) Anaphylaxis    Pt stated he has shortness of breathe when eating peanuts and peanut butter   Sunflower Oil Anaphylaxis    Pt stated he has shortness of breathe when eating sunflower seeds and oil   Amoxicillin Hives and Palpitations    Patient states he had an allergic reaction to this medication in the past.    Prilosec [Omeprazole] Other (See Comments)    Pt states he can't breath too well    Immunization History  Administered Date(s) Administered   Influenza,inj,Quad PF,6+ Mos 03/14/2018, 11/07/2018, 11/10/2020, 11/25/2021   Influenza,inj,Quad PF,6-35 Mos 11/10/2019   Influenza-Unspecified 03/14/2018   Meningococcal Conjugate 04/27/2014   Pneumococcal Conjugate-13 04/27/2014   Pneumococcal Polysaccharide-23 03/14/2018   Rabies, IM  10/04/2020, 10/07/2020, 10/11/2020, 10/22/2020    Tobacco History: Social History   Tobacco Use  Smoking Status Never  Smokeless Tobacco Never   Counseling given: Not Answered   Outpatient Encounter Medications as of 04/02/2023  Medication Sig   acetaminophen (TYLENOL) 325 MG tablet Take 2 tablets (650 mg total) by mouth every 6 (six) hours as needed for moderate pain.   albuterol (VENTOLIN HFA) 108 (90 Base) MCG/ACT inhaler Inhale 2 puffs into the lungs every 6 (six) hours as needed for wheezing or shortness of breath.   apixaban (ELIQUIS) 2.5 MG TABS tablet Take 2.5 mg by mouth 2 (two) times daily.   cetirizine (ZYRTEC ALLERGY) 10 MG tablet Take 1 tablet (10 mg total) by mouth daily.   folic acid (FOLVITE) 1 MG tablet Take 1 mg by mouth daily.   hydroxyurea (HYDREA) 500 MG capsule Take 500 mg by mouth daily. May take with food to minimize GI side effects.   Multiple Vitamin (ONE-A-DAY 55 PLUS PO) Take 1 tablet by mouth daily.   [DISCONTINUED] Vitamin D, Ergocalciferol, (DRISDOL) 1.25 MG (50000 UNIT) CAPS capsule Take 1 capsule (50,000 Units total) by mouth every 7 (seven) days. X 12 weeks. (Patient taking differently: Take 50,000 Units by mouth every 30 (thirty) days. X 12 weeks.)   baclofen (LIORESAL) 10 MG tablet Take 0.5-1 tablets (5-10 mg total) by mouth 3 (three) times daily as needed for muscle spasms. (Patient not taking: Reported on 04/02/2023)   famotidine (PEPCID) 20 MG tablet Take 1 tablet (20 mg total) by mouth 2 (  two) times daily. (Patient not taking: Reported on 04/02/2023)   Phenylephrine-DM-GG-APAP (VICKS DAYQUIL SEVERE COLD/FLU) 5-10-200-325 MG TABS Take by mouth. (Patient not taking: Reported on 04/02/2023)   predniSONE (DELTASONE) 20 MG tablet Take 2 tablets daily with breakfast. (Patient not taking: Reported on 04/02/2023)   promethazine-dextromethorphan (PROMETHAZINE-DM) 6.25-15 MG/5ML syrup Take 5 mLs by mouth 3 (three) times daily as needed for cough. (Patient not taking:  Reported on 04/02/2023)   Vitamin D, Ergocalciferol, (DRISDOL) 1.25 MG (50000 UNIT) CAPS capsule Take 1 capsule (50,000 Units total) by mouth every 7 (seven) days. X 12 weeks.   No facility-administered encounter medications on file as of 04/02/2023.    Review of Systems  Review of Systems  Constitutional: Negative.   HENT: Negative.    Cardiovascular: Negative.   Gastrointestinal: Negative.   Allergic/Immunologic: Negative.   Neurological: Negative.   Psychiatric/Behavioral: Negative.       Objective:   BP 109/64   Pulse 77   Temp 97.9 F (36.6 C)   Wt 222 lb 9.6 oz (101 kg)   SpO2 96%   BMI 25.72 kg/m   Wt Readings from Last 5 Encounters:  04/02/23 222 lb 9.6 oz (101 kg)  11/30/22 222 lb (100.7 kg)  05/29/22 219 lb 6.4 oz (99.5 kg)  03/31/22 219 lb 3.2 oz (99.4 kg)  02/09/22 217 lb 6.4 oz (98.6 kg)     Physical Exam Vitals and nursing note reviewed.  Constitutional:      General: He is not in acute distress.    Appearance: He is well-developed.  Cardiovascular:     Rate and Rhythm: Normal rate and regular rhythm.  Pulmonary:     Effort: Pulmonary effort is normal.     Breath sounds: Normal breath sounds.  Skin:    General: Skin is warm and dry.  Neurological:     Mental Status: He is alert and oriented to person, place, and time.       Assessment & Plan:   Hemoglobin SS disease without crisis (HCC) -     Sickle Cell Panel  Vitamin deficiency -     Vitamin D (Ergocalciferol); Take 1 capsule (50,000 Units total) by mouth every 7 (seven) days. X 12 weeks.  Dispense: 5 capsule; Refill: 2  Thyroid disorder screen -     Thyroid Panel With TSH     Return in about 3 months (around 07/03/2023).   Ivonne Andrew, NP 04/11/2023

## 2023-04-02 NOTE — Patient Instructions (Signed)
 1. Vitamin deficiency  - Vitamin D, Ergocalciferol, (DRISDOL) 1.25 MG (50000 UNIT) CAPS capsule; Take 1 capsule (50,000 Units total) by mouth every 7 (seven) days. X 12 weeks.  Dispense: 5 capsule; Refill: 2

## 2023-04-03 LAB — CMP14+CBC/D/PLT+FER+RETIC+V...
ALT: 19 IU/L (ref 0–44)
AST: 34 IU/L (ref 0–40)
Albumin: 4.4 g/dL (ref 4.1–5.1)
Alkaline Phosphatase: 49 IU/L (ref 44–121)
BUN/Creatinine Ratio: 11 (ref 9–20)
BUN: 9 mg/dL (ref 6–20)
Basophils Absolute: 0.1 10*3/uL (ref 0.0–0.2)
Basos: 1 %
Bilirubin Total: 5.3 mg/dL — ABNORMAL HIGH (ref 0.0–1.2)
CO2: 24 mmol/L (ref 20–29)
Calcium: 9.1 mg/dL (ref 8.7–10.2)
Chloride: 105 mmol/L (ref 96–106)
Creatinine, Ser: 0.82 mg/dL (ref 0.76–1.27)
EOS (ABSOLUTE): 0.3 10*3/uL (ref 0.0–0.4)
Eos: 3 %
Ferritin: 140 ng/mL (ref 30–400)
Globulin, Total: 2.6 g/dL (ref 1.5–4.5)
Glucose: 88 mg/dL (ref 70–99)
Hematocrit: 28.2 % — ABNORMAL LOW (ref 37.5–51.0)
Hemoglobin: 9.5 g/dL — ABNORMAL LOW (ref 13.0–17.7)
Immature Grans (Abs): 0 10*3/uL (ref 0.0–0.1)
Immature Granulocytes: 0 %
Lymphocytes Absolute: 2.1 10*3/uL (ref 0.7–3.1)
Lymphs: 21 %
MCH: 36.4 pg — ABNORMAL HIGH (ref 26.6–33.0)
MCHC: 33.7 g/dL (ref 31.5–35.7)
MCV: 108 fL — ABNORMAL HIGH (ref 79–97)
Monocytes Absolute: 0.9 10*3/uL (ref 0.1–0.9)
Monocytes: 10 %
NRBC: 2 % — ABNORMAL HIGH (ref 0–0)
Neutrophils Absolute: 6.2 10*3/uL (ref 1.4–7.0)
Neutrophils: 65 %
Platelets: 447 10*3/uL (ref 150–450)
Potassium: 4.3 mmol/L (ref 3.5–5.2)
RBC: 2.61 x10E6/uL — CL (ref 4.14–5.80)
RDW: 19 % — ABNORMAL HIGH (ref 11.6–15.4)
Retic Ct Pct: 8 % — ABNORMAL HIGH (ref 0.6–2.6)
Sodium: 140 mmol/L (ref 134–144)
Total Protein: 7 g/dL (ref 6.0–8.5)
Vit D, 25-Hydroxy: 27.2 ng/mL — ABNORMAL LOW (ref 30.0–100.0)
WBC: 9.7 10*3/uL (ref 3.4–10.8)
eGFR: 120 mL/min/{1.73_m2} (ref >59)

## 2023-04-03 LAB — THYROID PANEL WITH TSH
Free Thyroxine Index: 1.9 (ref 1.2–4.9)
T3 Uptake Ratio: 25 % (ref 24–39)
T4, Total: 7.6 ug/dL (ref 4.5–12.0)
TSH: 3.21 u[IU]/mL (ref 0.450–4.500)

## 2023-04-06 DIAGNOSIS — D571 Sickle-cell disease without crisis: Secondary | ICD-10-CM | POA: Diagnosis not present

## 2023-04-06 DIAGNOSIS — Z880 Allergy status to penicillin: Secondary | ICD-10-CM | POA: Diagnosis not present

## 2023-04-06 DIAGNOSIS — Z7901 Long term (current) use of anticoagulants: Secondary | ICD-10-CM | POA: Diagnosis not present

## 2023-04-06 DIAGNOSIS — I272 Pulmonary hypertension, unspecified: Secondary | ICD-10-CM | POA: Diagnosis not present

## 2023-04-06 DIAGNOSIS — I341 Nonrheumatic mitral (valve) prolapse: Secondary | ICD-10-CM | POA: Diagnosis not present

## 2023-04-06 DIAGNOSIS — R002 Palpitations: Secondary | ICD-10-CM | POA: Diagnosis not present

## 2023-04-11 ENCOUNTER — Encounter: Payer: Self-pay | Admitting: Nurse Practitioner

## 2023-04-24 ENCOUNTER — Non-Acute Institutional Stay (HOSPITAL_COMMUNITY)
Admission: AD | Admit: 2023-04-24 | Discharge: 2023-04-24 | Disposition: A | Discharge status: Home / Self Care | Source: Ambulatory Visit | Attending: Internal Medicine | Admitting: Internal Medicine

## 2023-04-24 ENCOUNTER — Telehealth (HOSPITAL_COMMUNITY): Payer: Self-pay

## 2023-04-24 DIAGNOSIS — D5700 Hb-SS disease with crisis, unspecified: Secondary | ICD-10-CM | POA: Diagnosis present

## 2023-04-24 DIAGNOSIS — Z79899 Other long term (current) drug therapy: Secondary | ICD-10-CM | POA: Diagnosis not present

## 2023-04-24 DIAGNOSIS — E559 Vitamin D deficiency, unspecified: Secondary | ICD-10-CM | POA: Insufficient documentation

## 2023-04-24 DIAGNOSIS — Z7901 Long term (current) use of anticoagulants: Secondary | ICD-10-CM | POA: Insufficient documentation

## 2023-04-24 DIAGNOSIS — Z86711 Personal history of pulmonary embolism: Secondary | ICD-10-CM | POA: Insufficient documentation

## 2023-04-24 LAB — COMPREHENSIVE METABOLIC PANEL
ALT: 14 U/L (ref 0–44)
AST: 54 U/L — ABNORMAL HIGH (ref 15–41)
Albumin: 4.3 g/dL (ref 3.5–5.0)
Alkaline Phosphatase: 36 U/L — ABNORMAL LOW (ref 38–126)
Anion gap: 8 (ref 5–15)
BUN: 10 mg/dL (ref 6–20)
CO2: 25 mmol/L (ref 22–32)
Calcium: 9.1 mg/dL (ref 8.9–10.3)
Chloride: 105 mmol/L (ref 98–111)
Creatinine, Ser: 0.55 mg/dL — ABNORMAL LOW (ref 0.61–1.24)
GFR, Estimated: >60 mL/min (ref >60)
Glucose, Bld: 83 mg/dL (ref 70–99)
Potassium: 4.3 mmol/L (ref 3.5–5.1)
Sodium: 138 mmol/L (ref 135–145)
Total Bilirubin: 5.6 mg/dL — ABNORMAL HIGH (ref 0.0–1.2)
Total Protein: 8.1 g/dL (ref 6.5–8.1)

## 2023-04-24 LAB — CBC WITH DIFFERENTIAL/PLATELET
Abs Immature Granulocytes: 0.01 10*3/uL (ref 0.00–0.07)
Basophils Absolute: 0.1 10*3/uL (ref 0.0–0.1)
Basophils Relative: 2 %
Eosinophils Absolute: 0.2 10*3/uL (ref 0.0–0.5)
Eosinophils Relative: 2 %
HCT: 27.6 % — ABNORMAL LOW (ref 39.0–52.0)
Hemoglobin: 9.7 g/dL — ABNORMAL LOW (ref 13.0–17.0)
Immature Granulocytes: 0 %
Lymphocytes Relative: 22 %
Lymphs Abs: 1.5 10*3/uL (ref 0.7–4.0)
MCH: 35.9 pg — ABNORMAL HIGH (ref 26.0–34.0)
MCHC: 35.1 g/dL (ref 30.0–36.0)
MCV: 102.2 fL — ABNORMAL HIGH (ref 80.0–100.0)
Monocytes Absolute: 0.8 10*3/uL (ref 0.1–1.0)
Monocytes Relative: 12 %
Neutro Abs: 4.3 10*3/uL (ref 1.7–7.7)
Neutrophils Relative %: 62 %
Platelets: 246 10*3/uL (ref 150–400)
RBC: 2.7 MIL/uL — ABNORMAL LOW (ref 4.22–5.81)
RDW: 18.6 % — ABNORMAL HIGH (ref 11.5–15.5)
WBC: 6.9 10*3/uL (ref 4.0–10.5)
nRBC: 3.5 % — ABNORMAL HIGH (ref 0.0–0.2)

## 2023-04-24 LAB — RETICULOCYTES
Immature Retic Fract: 35.8 % — ABNORMAL HIGH (ref 2.3–15.9)
RBC.: 2.63 MIL/uL — ABNORMAL LOW (ref 4.22–5.81)
Retic Count, Absolute: 228 10*3/uL — ABNORMAL HIGH (ref 19.0–186.0)
Retic Ct Pct: 8.3 % — ABNORMAL HIGH (ref 0.4–3.1)

## 2023-04-24 LAB — LACTATE DEHYDROGENASE: LDH: 423 U/L — ABNORMAL HIGH (ref 98–192)

## 2023-04-24 MED ORDER — SENNOSIDES-DOCUSATE SODIUM 8.6-50 MG PO TABS: 1.0000 | ORAL_TABLET | Freq: Two times a day (BID) | ORAL | Status: DC

## 2023-04-24 MED ORDER — SODIUM CHLORIDE 0.45 % IV SOLN: INTRAVENOUS | Status: DC

## 2023-04-24 MED ORDER — POLYETHYLENE GLYCOL 3350 17 G PO PACK: 17.0000 g | PACK | Freq: Every day | ORAL | Status: DC | PRN

## 2023-04-24 MED ORDER — ACETAMINOPHEN 500 MG PO TABS
1000.0000 mg | ORAL_TABLET | Freq: Once | ORAL | Status: DC
  Filled 2023-04-24: qty 2

## 2023-04-24 NOTE — Discharge Summary (Signed)
 Physician Discharge Summary  Fabien Travelstead Comer GEX:[id number] DOB: 07-27-1990 DOA: 04/24/2023  PCP: Ivonne Andrew, NP  Admit date: 04/24/2023  Discharge date: 04/24/2023  Time spent: 30 minutes  Discharge Diagnoses:  Principal Problem:   Sickle cell anemia with pain [facility])   Discharge Condition: Stable  Diet recommendation: Regular  History of present illness:  Gregory Espinoza is a 33 y.o. male with history of sickle cell disease, history of erythrocytopenia, history of HSV 1, thrombocytopenia, vitamin D deficiency, history of prior PE and takes Eliquis.  Patient presents to today clinic with pain of 7/10 in his left neck area and left chest area.  No known inciting factor.  Patient denies nausea, vomiting, diarrhea, fever and chest pain.  No sick contacts.  Hospital Course:  Breckyn Ticas was admitted to the day hospital with sickle cell Pain. Patient was treated with IV fluid, and 1000 mg Tylenol x 1 dose and other adjunct therapies per sickle cell pain management protocol. Cruz showed significant improvement symptomatically, pain improved from 7/10 to 3/10 at the time of discharge. Patient was discharged home in a hemodynamically stable condition. Bladyn will follow-up at the clinic as previously scheduled, continue with home medications as per prior to admission.  Discharge Instructions We discussed the need for good hydration, monitoring of hydration status, avoidance of heat, cold, stress, and infection triggers. We discussed the need to be compliant with taking Hydrea and other home medications. Roxie was reminded of the need to seek medical attention immediately if any symptom of bleeding, anemia, or infection occurs.  Discharge Exam: Vitals:   04/24/23 1350 04/24/23 1532  BP: 120/70 107/62  Pulse: 72 71  Resp: 16 16  Temp: 98.5 F (36.9 C) 98.2 F (36.8 C)  SpO2: 98% 100%    General appearance: alert, cooperative and no distress Eyes:  conjunctivae/corneas clear. PERRL, EOM's intact. Fundi benign. Neck: no adenopathy, no carotid bruit, no JVD, supple, symmetrical, trachea midline and thyroid not enlarged, symmetric, no tenderness/mass/nodules Back: symmetric, no curvature. ROM normal. No CVA tenderness. Resp: clear to auscultation bilaterally Chest wall: no tenderness Cardio: regular rate and rhythm, S1, S2 normal, no murmur, click, rub or gallop GI: soft, non-tender; bowel sounds normal; no masses,  no organomegaly Extremities: extremities normal, atraumatic, no cyanosis or edema Pulses: 2+ and symmetric Skin: Skin color, texture, turgor normal. No rashes or lesions Neurologic: Grossly normal  Discharge Instructions     Diet - low sodium heart healthy   Complete by: As directed    Increase activity slowly   Complete by: As directed       Allergies as of 04/24/2023       Reactions   Peanut Butter Flavoring Agent (non-screening) Anaphylaxis   Pt stated he has shortness of breathe when eating peanuts and peanut butter   Sunflower Oil Anaphylaxis   Pt stated he has shortness of breathe when eating sunflower seeds and oil   Amoxicillin Hives, Palpitations   Patient states he had an allergic reaction to this medication in the past.    Prilosec [omeprazole] Other (See Comments)   Pt states he can't breath too well        Medication List     TAKE these medications    acetaminophen 325 MG tablet Commonly known as: Tylenol Take 2 tablets (650 mg total) by mouth every 6 (six) hours as needed for moderate pain.   albuterol 108 (90 Base) MCG/ACT inhaler Commonly known as: VENTOLIN HFA Inhale 2 puffs into  the lungs every 6 (six) hours as needed for wheezing or shortness of breath.   baclofen 10 MG tablet Commonly known as: LIORESAL Take 0.5-1 tablets (5-10 mg total) by mouth 3 (three) times daily as needed for muscle spasms.   cetirizine 10 MG tablet Commonly known as: ZyrTEC Allergy Take 1 tablet (10 mg  total) by mouth daily.   Eliquis 2.5 MG Tabs tablet Generic drug: apixaban Take 2.5 mg by mouth 2 (two) times daily.   famotidine 20 MG tablet Commonly known as: Pepcid Take 1 tablet (20 mg total) by mouth 2 (two) times daily.   folic acid 1 MG tablet Commonly known as: FOLVITE Take 1 mg by mouth daily.   hydroxyurea 500 MG capsule Commonly known as: HYDREA Take 500 mg by mouth daily. May take with food to minimize GI side effects.   ONE-A-DAY 55 PLUS PO Take 1 tablet by mouth daily.   predniSONE 20 MG tablet Commonly known as: DELTASONE Take 2 tablets daily with breakfast.   promethazine-dextromethorphan 6.25-15 MG/5ML syrup Commonly known as: PROMETHAZINE-DM Take 5 mLs by mouth 3 (three) times daily as needed for cough.   Vicks DayQuil Severe Cold/Flu 5-10-200-325 MG Tabs Generic drug: Phenylephrine-DM-GG-APAP Take by mouth.   Vitamin D (Ergocalciferol) 1.25 MG (50000 UNIT) Caps capsule Commonly known as: DRISDOL Take 1 capsule (50,000 Units total) by mouth every 7 (seven) days. X 12 weeks.       Allergies  Allergen Reactions   Peanut Butter Flavoring Agent (Non-Screening) Anaphylaxis    Pt stated he has shortness of breathe when eating peanuts and peanut butter   Sunflower Oil Anaphylaxis    Pt stated he has shortness of breathe when eating sunflower seeds and oil   Amoxicillin Hives and Palpitations    Patient states he had an allergic reaction to this medication in the past.    Prilosec [Omeprazole] Other (See Comments)    Pt states he can't breath too well     Significant Diagnostic Studies: No results found.  Signed:  Daryll Drown NP   04/24/2023, 4:07 PM

## 2023-04-24 NOTE — Telephone Encounter (Signed)
 Patient arrived in lobby of patient care center and triaged. Complains of pain in back and chest rates 7/10. Endorses  chest pain that is typical for his sickle cell crisis and dizziness. Denies abd pain, fever, N/V/D, or priapism. Wants to come in for treatment. Last took tylenol at 10pm, pt states he does not take other pain medication at home. Pt states he was in the ED a few weeks ago. Pt states either his mother can get him if he receives narcotics today, or a taxi can be arranged home for him. Onyeje FNP notified, approved for pt to be seen in the day hospital. Pt made aware, verbalized understanding.

## 2023-04-24 NOTE — Progress Notes (Signed)
 Pt is admitted to the day hospital today for sickle cell pain treatment. On arrival, pt reports 7/10 pain to chest and head. Pt received IV fluids via PIV. At discharge, pt rates pain 3/10. AVS offered, but pt declined. Pt Is alert, oriented, and ambulatory at discharge.

## 2023-04-24 NOTE — H&P (Signed)
 [facility] History and Physical  Jemmie Rhinehart Koehn BMW:[id number] DOB: [birth year] DOA: 04/24/2023  PCP: Ivonne Andrew, NP   Chief Complaint:  Chief Complaint  Patient presents with   Sickle Cell Pain Crisis    HPI: Gregory Espinoza is a 33 y.o. male with history of sickle cell disease, history of erythrocytopenia, history of HSV 1, thrombocytopenia, vitamin D deficiency, history of prior PE and takes Eliquis.  Patient presents to today clinic with pain of 7/10 in his left neck area and left chest area.  No known inciting factor.  Patient denies nausea, vomiting, diarrhea, fever and chest pain.  No sick contacts.   Systemic Review: General: The patient denies anorexia, fever, weight loss Cardiac: Denies chest pain, syncope, palpitations, pedal edema  Respiratory: Denies cough, shortness of breath, wheezing GI: Denies severe indigestion/heartburn, abdominal pain, nausea, vomiting, diarrhea and constipation GU: Denies hematuria, incontinence, dysuria  Musculoskeletal: Denies arthritis  Skin: Denies suspicious skin lesions Neurologic: Denies focal weakness or numbness, change in vision  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   Pulmonary embolism (HCC)    per pt   Sickle cell anemia (HCC)    Thrombocytopenia (HCC) 12/2018   Vitamin D deficiency 12/2018    Past Surgical History:  Procedure Laterality Date   DENTAL SURGERY      Allergies  Allergen Reactions   Peanut Butter Flavoring Agent (Non-Screening) Anaphylaxis    Pt stated he has shortness of breathe when eating peanuts and peanut butter   Sunflower Oil Anaphylaxis    Pt stated he has shortness of breathe when eating sunflower seeds and oil   Amoxicillin Hives and Palpitations    Patient states he had an allergic reaction to this medication in the past.    Prilosec [Omeprazole] Other (See  Comments)    Pt states he can't breath too well    Family History  Problem Relation Age of Onset   Hypertension Mother    Hypertension Father    Sickle cell anemia Brother       Prior to Admission medications   Medication Sig Start Date End Date Taking? Authorizing Provider  acetaminophen (TYLENOL) 325 MG tablet Take 2 tablets (650 mg total) by mouth every 6 (six) hours as needed for moderate pain. 10/19/22  Yes Wallis Bamberg, PA-C  albuterol (VENTOLIN HFA) 108 (90 Base) MCG/ACT inhaler Inhale 2 puffs into the lungs every 6 (six) hours as needed for wheezing or shortness of breath. 03/31/22  Yes Ivonne Andrew, NP  apixaban (ELIQUIS) 2.5 MG TABS tablet Take 2.5 mg by mouth 2 (two) times daily.   Yes [provider]  folic acid (FOLVITE) 1 MG tablet Take 1 mg by mouth daily. 12/05/17  Yes [provider]  hydroxyurea (HYDREA) 500 MG capsule Take 500 mg by mouth daily. May take with food to minimize GI side effects.   Yes [provider]  Multiple Vitamin (ONE-A-DAY 55 PLUS PO) Take 1 tablet by mouth daily.   Yes [provider]  Vitamin D, Ergocalciferol, (DRISDOL) 1.25 MG (50000 UNIT) CAPS capsule Take 1 capsule (50,000 Units total) by mouth every 7 (seven) days. X 12 weeks. 04/02/23  Yes Ivonne Andrew, NP  baclofen (LIORESAL) 10 MG tablet Take 0.5-1 tablets (5-10 mg total) by mouth 3 (three) times daily as needed for muscle spasms. Patient not taking: Reported on  03/30/2023 11/27/22   Vallery Sa, Amy L, PA  cetirizine (ZYRTEC ALLERGY) 10 MG tablet Take 1 tablet (10 mg total) by mouth daily. 07/12/22   Wallis Bamberg, PA-C  famotidine (PEPCID) 20 MG tablet Take 1 tablet (20 mg total) by mouth 2 (two) times daily. Patient not taking: Reported on 04/02/2023 05/29/22 06/28/22  Ivonne Andrew, NP  Phenylephrine-DM-GG-APAP [facility] DAYQUIL SEVERE COLD/FLU) 5-10-200-325 MG TABS Take by mouth. Patient not taking: Reported on 03/30/2023    [provider]  predniSONE  (DELTASONE) 20 MG tablet Take 2 tablets daily with breakfast. Patient not taking: Reported on 03/30/2023 07/12/22   Wallis Bamberg, PA-C  promethazine-dextromethorphan (PROMETHAZINE-DM) 6.25-15 MG/5ML syrup Take 5 mLs by mouth 3 (three) times daily as needed for cough. Patient not taking: Reported on 03/30/2023 07/12/22   Wallis Bamberg, PA-C     Physical Exam: Vitals:   04/24/23 0905 04/24/23 1134 04/24/23 1350 04/24/23 1532  BP: (!) 123/51 120/69 120/70 107/62  Pulse: 73 64 72 71  Resp: 16 16 16 16   Temp: 98.1 F (36.7 C) 98.4 F (36.9 C) 98.5 F (36.9 C) 98.2 F (36.8 C)  TempSrc: Temporal Temporal Temporal Temporal  SpO2: 97% 97% 98% 100%    General: Alert, awake, afebrile, anicteric, not in obvious distress HEENT: Normocephalic and Atraumatic, Mucous membranes pink                PERRLA; EOM intact; No scleral icterus,                 Nares: Patent, Oropharynx: Clear, Fair Dentition                 Neck: FROM, no cervical lymphadenopathy, thyromegaly, carotid bruit or JVD;  CHEST WALL: No tenderness  CHEST: Normal respiration, clear to auscultation bilaterally  HEART: Regular rate and rhythm; no murmurs rubs or gallops  BACK: No kyphosis or scoliosis; no CVA tenderness  ABDOMEN: Positive Bowel Sounds, soft, non-tender; no masses, no organomegaly EXTREMITIES: No cyanosis, clubbing or edema,  Musculoskeletal: Left side pain. SKIN:  no rash or ulceration  CNS: Alert and Oriented x 4, Nonfocal exam, CN 2-12 intact Neck: left lateral neck pain.   Labs on Admission:  Basic Metabolic Panel: Recent Labs  Lab 04/24/23 0918  NA 138  K 4.3  CL 105  CO2 25  GLUCOSE 83  BUN 10  CREATININE 0.55*  CALCIUM 9.1   Liver Function Tests: Recent Labs  Lab 04/24/23 0918  AST 54*  ALT 14  ALKPHOS 36*  BILITOT 5.6*  PROT 8.1  ALBUMIN 4.3   No results for input(s): "LIPASE", "AMYLASE" in the last 168 hours. No results for input(s): "AMMONIA" in the last 168 hours. CBC: Recent Labs   Lab 04/24/23 0918  WBC 6.9  NEUTROABS 4.3  HGB 9.7*  HCT 27.6*  MCV 102.2*  PLT 246   Cardiac Enzymes: No results for input(s): "CKTOTAL", "CKMB", "CKMBINDEX", "TROPONINI" in the last 168 hours.  BNP (last 3 results) No results for input(s): "BNP" in the last 8760 hours.  ProBNP (last 3 results) No results for input(s): "PROBNP" in the last 8760 hours.  CBG: No results for input(s): "GLUCAP" in the last 168 hours.   Assessment/Plan Principal Problem:   Sickle cell anemia with pain (HCC)  Admits to the Day Hospital for extended observation IVF 0 .45% Saline @ 150 mls/hour Acetaminophen 1000 mg x 1 dose Labs: CBCD, CMP, Retic Count and LDH Monitor vitals very closely, Re-evaluate pain scale every hour  2 L of Oxygen by [place] Patient will be re-evaluated for pain in the context of function and relationship to baseline as care progresses. If no significant relieve from pain (remains above 5/10) will transfer patient to inpatient services for further evaluation and management  Code Status: Full  Family Communication: None  DVT Prophylaxis: Ambulate as tolerated   Time spent: 35 Minutes  Daryll Drown NP  If 7PM-7AM, please contact night-coverage [email or web address] 04/24/2023, 4:05 PM

## 2023-04-30 ENCOUNTER — Encounter: Payer: Self-pay | Admitting: Nurse Practitioner

## 2023-04-30 ENCOUNTER — Telehealth (INDEPENDENT_AMBULATORY_CARE_PROVIDER_SITE_OTHER): Payer: Self-pay | Admitting: Nurse Practitioner

## 2023-04-30 DIAGNOSIS — R42 Dizziness and giddiness: Secondary | ICD-10-CM

## 2023-04-30 DIAGNOSIS — H9202 Otalgia, left ear: Secondary | ICD-10-CM | POA: Diagnosis not present

## 2023-04-30 DIAGNOSIS — F4323 Adjustment disorder with mixed anxiety and depressed mood: Secondary | ICD-10-CM | POA: Diagnosis not present

## 2023-04-30 NOTE — Progress Notes (Signed)
.  Virtual Visit via Video Note  I connected with Gregory Espinoza on 04/30/23 at  9:20 AM EDT by a video enabled telemedicine application and verified that I am speaking with the correct person using two identifiers.  Location: Patient: home Provider: office   I discussed the limitations of evaluation and management by telemedicine and the availability of in person appointments. The patient expressed understanding and agreed to proceed.  History of Present Illness:  Patient presents today for a video visit.  Patient is requesting a referral to ENT.  He states that he has been having issues with dizziness and pain and drainage in his left ear.  He states that he does have a lot of earwax buildup.  We discussed that he can trial Debrox earwax drops.  We will place referral to ENT for patient per his request. Denies f/c/s, n/v/d, hemoptysis, PND, leg swelling Denies chest pain or edema     Observations/Objective:     04/24/2023    3:32 PM 04/24/2023    1:50 PM 04/24/2023   11:34 AM  Vitals with BMI  Systolic 107 120 161  Diastolic 62 70 69  Pulse 71 72 64      Assessment and Plan:  1. Dizziness (Primary)  - Ambulatory referral to ENT  2. Left ear pain  - Ambulatory referral to ENT  May trial debrox ear drops.   Follow up:  Follow up as scheduled.      I discussed the assessment and treatment plan with the patient. The patient was provided an opportunity to ask questions and all were answered. The patient agreed with the plan and demonstrated an understanding of the instructions.   The patient was advised to call back or seek an in-person evaluation if the symptoms worsen or if the condition fails to improve as anticipated.  I provided 23 minutes of non-face-to-face time during this encounter.   Ivonne Andrew, NP

## 2023-05-02 ENCOUNTER — Ambulatory Visit: Payer: Self-pay | Admitting: Nurse Practitioner

## 2023-05-08 DIAGNOSIS — R42 Dizziness and giddiness: Secondary | ICD-10-CM | POA: Diagnosis not present

## 2023-05-14 DIAGNOSIS — C7800 Secondary malignant neoplasm of unspecified lung: Secondary | ICD-10-CM | POA: Diagnosis not present

## 2023-05-14 DIAGNOSIS — D571 Sickle-cell disease without crisis: Secondary | ICD-10-CM | POA: Diagnosis not present

## 2023-05-14 DIAGNOSIS — Z7901 Long term (current) use of anticoagulants: Secondary | ICD-10-CM | POA: Diagnosis not present

## 2023-05-14 DIAGNOSIS — I2699 Other pulmonary embolism without acute cor pulmonale: Secondary | ICD-10-CM | POA: Diagnosis not present

## 2023-05-17 DIAGNOSIS — Z5181 Encounter for therapeutic drug level monitoring: Secondary | ICD-10-CM | POA: Diagnosis not present

## 2023-05-17 DIAGNOSIS — M419 Scoliosis, unspecified: Secondary | ICD-10-CM | POA: Diagnosis not present

## 2023-05-17 DIAGNOSIS — D571 Sickle-cell disease without crisis: Secondary | ICD-10-CM | POA: Diagnosis not present

## 2023-05-17 DIAGNOSIS — J984 Other disorders of lung: Secondary | ICD-10-CM | POA: Diagnosis not present

## 2023-05-17 DIAGNOSIS — R942 Abnormal results of pulmonary function studies: Secondary | ICD-10-CM | POA: Diagnosis not present

## 2023-05-17 DIAGNOSIS — Z79899 Other long term (current) drug therapy: Secondary | ICD-10-CM | POA: Diagnosis not present

## 2023-06-12 DIAGNOSIS — R42 Dizziness and giddiness: Secondary | ICD-10-CM | POA: Diagnosis not present

## 2023-07-01 ENCOUNTER — Other Ambulatory Visit: Payer: Self-pay | Admitting: Nurse Practitioner

## 2023-07-01 DIAGNOSIS — E569 Vitamin deficiency, unspecified: Secondary | ICD-10-CM

## 2023-07-02 NOTE — Telephone Encounter (Signed)
 Please advise [facility]

## 2023-07-06 ENCOUNTER — Ambulatory Visit (HOSPITAL_BASED_OUTPATIENT_CLINIC_OR_DEPARTMENT_OTHER)
Admission: RE | Admit: 2023-07-06 | Discharge: 2023-07-06 | Disposition: A | Discharge status: Home / Self Care | Source: Ambulatory Visit | Attending: Urgent Care | Admitting: Urgent Care

## 2023-07-06 ENCOUNTER — Ambulatory Visit
Admission: RE | Admit: 2023-07-06 | Discharge: 2023-07-06 | Disposition: A | Discharge status: Home / Self Care | Source: Ambulatory Visit | Attending: Family Medicine | Admitting: Family Medicine

## 2023-07-06 ENCOUNTER — Ambulatory Visit: Payer: Self-pay | Admitting: Urgent Care

## 2023-07-06 DIAGNOSIS — Z113 Encounter for screening for infections with a predominantly sexual mode of transmission: Secondary | ICD-10-CM | POA: Diagnosis not present

## 2023-07-06 DIAGNOSIS — R079 Chest pain, unspecified: Secondary | ICD-10-CM | POA: Insufficient documentation

## 2023-07-06 DIAGNOSIS — J988 Other specified respiratory disorders: Secondary | ICD-10-CM | POA: Insufficient documentation

## 2023-07-06 DIAGNOSIS — R059 Cough, unspecified: Secondary | ICD-10-CM | POA: Diagnosis not present

## 2023-07-06 MED ORDER — PREDNISONE 20 MG PO TABS
ORAL_TABLET | ORAL | 0 refills | Status: DC
Start: 2023-07-06 — End: 2023-07-11

## 2023-07-06 NOTE — Discharge Instructions (Signed)
 I have placed orders to have an x-ray done at the med center in [facility].  Please had there now.  Go through the main hospital and not the emergency room.  Once you are there and let them know that you will came to our clinic and we send she to their facility for an outpatient x-ray.  If no one is at the front desk then they are likely out the rest of the day and at that point you would have to go through the emergency room.  Do not check in as a patient through the emergency room.  Simply let them know that you are there for an outpatient x-ray from our clinic.  I will call you with your results and update our treatment plan if necessary after I get the report.

## 2023-07-06 NOTE — ED Provider Notes (Signed)
 [facility]  Note:  This document was prepared using Conservation officer, historic buildings and may include unintentional dictation errors.  MRN: [id number] DOB: 07-25-1990  Subjective:   Gregory Espinoza is a 33 y.o. male presenting for 5 to 6-day history of acute onset persistent coughing, chest congestion, fever, shortness of breath and left-sided lung pain.  Has been using his albuterol  inhaler.  Regarding his sickle cell anemia, reports that it is well-controlled. Takes hydroxyurea, folic acid and Eliquis . Gets regular follow-up for this. There is a history of pulmonary embolism but again is on Eliquis . No smoking of any kind including cigarettes, cigars, vaping, marijuana use.  Patient would like standard STI screening.  No current facility-administered medications for this encounter.  Current Outpatient Medications:    acetaminophen  (TYLENOL ) 325 MG tablet, Take 2 tablets (650 mg total) by mouth every 6 (six) hours as needed for moderate pain., Disp: 30 tablet, Rfl: 0   albuterol  (VENTOLIN  HFA) 108 (90 Base) MCG/ACT inhaler, Inhale 2 puffs into the lungs every 6 (six) hours as needed for wheezing or shortness of breath., Disp: 8 g, Rfl: 0   apixaban  (ELIQUIS ) 2.5 MG TABS tablet, Take 2.5 mg by mouth 2 (two) times daily., Disp: , Rfl:    baclofen  (LIORESAL ) 10 MG tablet, Take 0.5-1 tablets (5-10 mg total) by mouth 3 (three) times daily as needed for muscle spasms. (Patient not taking: Reported on 03/30/2023), Disp: 30 each, Rfl: 0   cetirizine  (ZYRTEC  ALLERGY) 10 MG tablet, Take 1 tablet (10 mg total) by mouth daily., Disp: 30 tablet, Rfl: 0   famotidine  (PEPCID ) 20 MG tablet, Take 1 tablet (20 mg total) by mouth 2 (two) times daily. (Patient not taking: Reported on 04/02/2023), Disp: 60 tablet, Rfl: 0   folic acid (FOLVITE) 1 MG tablet, Take 1 mg by mouth daily., Disp: , Rfl:    hydroxyurea (HYDREA) 500 MG capsule, Take 500 mg by mouth daily. May take with food to  minimize GI side effects., Disp: , Rfl:    Multiple Vitamin (ONE-A-DAY 55 PLUS PO), Take 1 tablet by mouth daily., Disp: , Rfl:    Phenylephrine-DM-GG-APAP (VICKS DAYQUIL SEVERE COLD/FLU) 5-10-200-325 MG TABS, Take by mouth. (Patient not taking: Reported on 03/30/2023), Disp: , Rfl:    predniSONE  (DELTASONE ) 20 MG tablet, Take 2 tablets daily with breakfast. (Patient not taking: Reported on 03/30/2023), Disp: 10 tablet, Rfl: 0   promethazine -dextromethorphan (PROMETHAZINE -DM) 6.25-15 MG/5ML syrup, Take 5 mLs by mouth 3 (three) times daily as needed for cough. (Patient not taking: Reported on 03/30/2023), Disp: 200 mL, Rfl: 0   Vitamin D , Ergocalciferol , (DRISDOL ) 1.25 MG (50000 UNIT) CAPS capsule, TAKE 1 CAPSULE BY MOUTH EVERY 7 DAYS FOR 12 WEEKS, Disp: 5 capsule, Rfl: 2   Allergies  Allergen Reactions   Peanut Butter Flavoring Agent (Non-Screening) Anaphylaxis    Pt stated he has shortness of breathe when eating peanuts and peanut butter   Sunflower Oil Anaphylaxis    Pt stated he has shortness of breathe when eating sunflower seeds and oil   Amoxicillin  Hives and Palpitations    Patient states he had an allergic reaction to this medication in the past.    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   Pulmonary embolism (HCC)  per pt   Sickle cell anemia (HCC)    Thrombocytopenia (HCC) 12/2018   Vitamin D  deficiency 12/2018     Past Surgical History:  Procedure Laterality Date   DENTAL SURGERY      Family History  Problem Relation Age of Onset   Hypertension Mother    Hypertension Father    Sickle cell anemia Brother     Social History   Tobacco Use   Smoking status: Never   Smokeless tobacco: Never  Vaping Use   Vaping status: Never Used  Substance Use Topics   Alcohol use: Yes     Comment: occ   Drug use: Never    ROS   Objective:   Vitals: BP (P) 117/75 (BP Location: Left Arm)   Pulse (P) 84   Temp (P) 98.1 F (36.7 C) (Oral)   Resp (P) 20   SpO2 (P) 94%   Physical Exam Constitutional:      General: He is not in acute distress.    Appearance: Normal appearance. He is well-developed and normal weight. He is not ill-appearing, toxic-appearing or diaphoretic.  HENT:     Head: Normocephalic and atraumatic.     Right Ear: External ear normal.     Left Ear: External ear normal.     Nose: Nose normal.     Mouth/Throat:     Mouth: Mucous membranes are moist.     Pharynx: No pharyngeal swelling, oropharyngeal exudate, posterior oropharyngeal erythema or uvula swelling.     Tonsils: No tonsillar exudate or tonsillar abscesses. 0 on the right. 0 on the left.  Eyes:     General: No scleral icterus.       Right eye: No discharge.        Left eye: No discharge.     Extraocular Movements: Extraocular movements intact.  Cardiovascular:     Rate and Rhythm: Normal rate and regular rhythm.     Heart sounds: Normal heart sounds. No murmur heard.    No friction rub. No gallop.  Pulmonary:     Effort: Pulmonary effort is normal. No respiratory distress.     Breath sounds: Normal breath sounds. No stridor. No wheezing, rhonchi or rales.  Musculoskeletal:     Cervical back: Normal range of motion.  Neurological:     Mental Status: He is alert and oriented to person, place, and time.  Psychiatric:        Mood and Affect: Mood normal.        Behavior: Behavior normal.        Thought Content: Thought content normal.        Judgment: Judgment normal.     Assessment and Plan :   PDMP not reviewed this encounter.  1. Respiratory infection   2. Left-sided chest pain   3. Screen for STD (sexually transmitted disease)     Will pursue outpatient imaging, x-ray order placed.  Will call and review, finalize diagnosis and treatment plan thereafter.  STI check  pending, will treat as appropriate based off of positive lab results.  Counseled patient on potential for adverse effects with medications prescribed/recommended today, ER and return-to-clinic precautions discussed, patient verbalized understanding.    Adolph Hoop, PA-C 07/06/23 1400

## 2023-07-06 NOTE — ED Triage Notes (Signed)
 Pt c/o cough, chest congestion, fever-sx started 6/1- c/o SHOB, "lung pain" started yesterday-NAD-steady gait

## 2023-07-07 LAB — RPR: RPR Ser Ql: NONREACTIVE

## 2023-07-07 LAB — HIV ANTIBODY (ROUTINE TESTING W REFLEX): HIV Screen 4th Generation wRfx: NONREACTIVE

## 2023-07-09 LAB — CYTOLOGY, (ORAL, ANAL, URETHRAL) ANCILLARY ONLY
Chlamydia: NEGATIVE
Comment: NEGATIVE
Comment: NEGATIVE
Comment: NORMAL
Neisseria Gonorrhea: NEGATIVE
Trichomonas: NEGATIVE

## 2023-07-11 ENCOUNTER — Ambulatory Visit
Admission: EM | Admit: 2023-07-11 | Discharge: 2023-07-11 | Disposition: A | Discharge status: Home / Self Care | Attending: Family Medicine | Admitting: Family Medicine

## 2023-07-11 DIAGNOSIS — S29019A Strain of muscle and tendon of unspecified wall of thorax, initial encounter: Secondary | ICD-10-CM

## 2023-07-11 MED ORDER — LIDOCAINE 5 % EX PTCH: 1.0000 | MEDICATED_PATCH | CUTANEOUS | 0 refills | Status: DC

## 2023-07-11 MED ORDER — CYCLOBENZAPRINE HCL 5 MG PO TABS: 5.0000 mg | ORAL_TABLET | Freq: Every evening | ORAL | 0 refills | Status: AC | PRN

## 2023-07-11 MED ORDER — PREDNISONE 10 MG PO TABS: 30.0000 mg | ORAL_TABLET | Freq: Every day | ORAL | 0 refills | Status: DC
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
## 2023-08-01 ENCOUNTER — Telehealth: Payer: Self-pay
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
## 2023-08-08 DIAGNOSIS — D571 Sickle-cell disease without crisis: Secondary | ICD-10-CM | POA: Diagnosis not present

## 2023-08-20 ENCOUNTER — Inpatient Hospital Stay: Payer: Self-pay | Admitting: Nurse Practitioner

## 2023-08-20 ENCOUNTER — Ambulatory Visit: Admission: EM | Admit: 2023-08-20 | Discharge: 2023-08-20 | Disposition: A | Discharge status: Home / Self Care

## 2023-08-20 DIAGNOSIS — J029 Acute pharyngitis, unspecified: Secondary | ICD-10-CM

## 2023-08-20 LAB — POCT RAPID STREP A (OFFICE): Rapid Strep A Screen: NEGATIVE

## 2023-08-20 MED ORDER — DEXAMETHASONE 4 MG PO TABS
8.0000 mg | ORAL_TABLET | Freq: Once | ORAL | Status: AC
  Administered 2023-08-20: 8 mg via ORAL

## 2023-08-20 NOTE — ED Triage Notes (Signed)
 Pt present with c/o  throat pain x three days.  Recently had a crown  replaced. Pt states the next morning he had pain when swallowing and yawning. Pt states he can feel the food going down when he swallows.  Home interventions: OTC throat relief medicine

## 2023-08-20 NOTE — Discharge Instructions (Signed)
 We recommend following up with your primary care provider in the next several days if symptoms are not improving.  Your strep test today was negative.  You can take Tylenol  and ibuprofen  as needed for pain relief.  You can try cough drops, throat lozenges, throat numbing spray, gargling warm salt water, tea with honey, and other warm liquids to help with your symptoms.  Please go to the emergency department immediately if you have any significant swelling of the neck, severe neck pain, difficulty breathing, begin drooling, or if you have any other concerns.

## 2023-08-20 NOTE — ED Provider Notes (Signed)
 [facility]    CSN: [id number] Arrival date & time: 08/20/23  1707      History   Chief Complaint Chief Complaint  Patient presents with   Sore Throat    HPI Gregory Espinoza is a 33 y.o. male.   Patient is a 33 year old male who presents to the urgent care today with concerns of sore throat for the past several days.  He reports that on Friday, he had a crown replacement.  He was feeling fine afterwards but on Saturday, developed a sore throat.  He called the dentist office who told him that it was likely due to an infection that he got while they were replacing his crown.  He has been taking some throat lozenges to try and help with his symptoms.  He denies any drooling, shortness of breath, fever, vomiting, rash, diarrhea, or other concerns at this time.    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   Pulmonary embolism (HCC)    per pt   Sickle cell anemia (HCC)    Thrombocytopenia (HCC) 12/2018   Vitamin D  deficiency 12/2018    Patient Active Problem List   Diagnosis Date Noted   Sickle cell anemia with pain (HCC) 04/24/2023   Gastroesophageal reflux disease without esophagitis 05/29/2022   History of COVID-19 04/12/2022   Screening examination for STD (sexually transmitted disease) 02/10/2022   Routine adult health maintenance 08/26/2021   Shortness of breath 05/04/2021   Abdominal pain, right lower quadrant 12/19/2018   Absolute anemia 12/13/2018   Hb-SS disease with acute chest syndrome (HCC) 10/24/2018   Transaminitis 03/13/2018   Sickle cell disease, type SS (HCC) 04/24/2014    Past Surgical History:  Procedure Laterality Date   DENTAL SURGERY         Home Medications    Prior to Admission medications   Medication Sig Start Date End Date Taking? Authorizing Provider  acetaminophen  (TYLENOL ) 325 MG tablet Take 2 tablets (650 mg total)  by mouth every 6 (six) hours as needed for moderate pain. 10/19/22   Christopher Savannah, PA-C  albuterol  (VENTOLIN  HFA) 108 (90 Base) MCG/ACT inhaler Inhale 2 puffs into the lungs every 6 (six) hours as needed for wheezing or shortness of breath. 03/31/22   Oley Bascom RAMAN, NP  apixaban  (ELIQUIS ) 2.5 MG TABS tablet Take 2.5 mg by mouth 2 (two) times daily.    [provider]  baclofen  (LIORESAL ) 10 MG tablet Take 0.5-1 tablets (5-10 mg total) by mouth 3 (three) times daily as needed for muscle spasms. Patient not taking: Reported on 03/30/2023 11/27/22   Vear, Amy L, PA  cetirizine  (ZYRTEC  ALLERGY) 10 MG tablet Take 1 tablet (10 mg total) by mouth daily. 07/12/22   Christopher Savannah, PA-C  cyclobenzaprine  (FLEXERIL ) 5 MG tablet Take 1 tablet (5 mg total) by mouth at bedtime as needed. 07/11/23   Christopher Savannah, PA-C  famotidine  (PEPCID ) 20 MG tablet Take 1 tablet (20 mg total) by mouth 2 (two) times daily. Patient not taking: Reported on 04/02/2023 05/29/22 06/28/22  Nichols, Tonya S, NP  folic acid (FOLVITE) 1 MG tablet Take 1 mg by mouth daily. 12/05/17   [provider]  hydroxyurea (HYDREA) 500 MG capsule Take 500 mg by mouth daily. May take with food to minimize GI side effects.    [provider]  lidocaine  (LIDODERM ) 5 % Place 1  patch onto the skin daily. Remove & Discard patch within 12 hours or as directed by MD 07/11/23   Christopher Savannah, PA-C  Multiple Vitamin (ONE-A-DAY 55 PLUS PO) Take 1 tablet by mouth daily.    [provider]  Phenylephrine-DM-GG-APAP (VICKS DAYQUIL SEVERE COLD/FLU) 5-10-200-325 MG TABS Take by mouth. Patient not taking: Reported on 03/30/2023    [provider]  predniSONE  (DELTASONE ) 10 MG tablet Take 3 tablets (30 mg total) by mouth daily with breakfast. 07/11/23   Christopher Savannah, PA-C  promethazine -dextromethorphan (PROMETHAZINE -DM) 6.25-15 MG/5ML syrup Take 5 mLs by mouth 3 (three) times daily as needed for cough. Patient not taking: Reported on  03/30/2023 07/12/22   Christopher Savannah, PA-C  Vitamin D , Ergocalciferol , (DRISDOL ) 1.25 MG (50000 UNIT) CAPS capsule TAKE 1 CAPSULE BY MOUTH EVERY 7 DAYS FOR 12 WEEKS 07/02/23   Oley Bascom RAMAN, NP    Family History Family History  Problem Relation Age of Onset   Hypertension Mother    Hypertension Father    Sickle cell anemia Brother     Social History Social History   Tobacco Use   Smoking status: Never   Smokeless tobacco: Never  Vaping Use   Vaping status: Never Used  Substance Use Topics   Alcohol use: Yes    Comment: occ   Drug use: Never     Allergies   Peanut butter flavoring agent (non-screening), Sunflower oil, Amoxicillin , and Prilosec [omeprazole ]   Review of Systems Review of Systems See HPI for relevant ROS.  Physical Exam Triage Vital Signs ED Triage Vitals  Encounter Vitals Group     BP 08/20/23 1751 120/75     Girls Systolic BP Percentile --      Girls Diastolic BP Percentile --      Boys Systolic BP Percentile --      Boys Diastolic BP Percentile --      Pulse Rate 08/20/23 1751 74     Resp 08/20/23 1751 20     Temp 08/20/23 1751 98.4 F (36.9 C)     Temp Source 08/20/23 1751 Oral     SpO2 08/20/23 1751 93 %     Weight --      Height --      Head Circumference --      Peak Flow --      Pain Score 08/20/23 1750 4     Pain Loc --      Pain Education --      Exclude from Growth Chart --    No data found.  Updated Vital Signs BP 120/75 (BP Location: Right Arm)   Pulse 74   Temp 98.4 F (36.9 C) (Oral)   Resp 20   SpO2 93%   Visual Acuity Right Eye Distance:   Left Eye Distance:   Bilateral Distance:    Right Eye Near:   Left Eye Near:    Bilateral Near:     Physical Exam General: Alert and oriented, well-developed/well-nourished, calm, cooperative, no acute distress HEENT: Normocephalic atraumatic, moist mucous membranes, no scleral icterus, trachea midline, pharynx without erythema or tonsillar swelling or exudates, uvula  midline Lungs: Speaking full sentences, non-labored respirations, no distress, clear to auscultation bilaterally Heart: Regular rate and rhythm Abdomen:  Soft, nondistended Musculoskeletal: Moves all extremities well Neurologic: Awake, A&O x4, gait normal Integumentary: Warm, dry, normal for ethnicity, intact, no rash Psychiatric: Appropriate mood & affect  UC Treatments / Results  Labs (all labs ordered are listed, but only abnormal results are  displayed) Labs Reviewed  POCT RAPID STREP A (OFFICE)    EKG   Radiology No results found.  Procedures Procedures (including critical care time)  Medications Ordered in UC Medications  dexamethasone  (DECADRON ) tablet 8 mg (8 mg Oral Given 08/20/23 1858)    Initial Impression / Assessment and Plan / UC Course  I have reviewed the triage vital signs and the nursing notes.  Pertinent labs & imaging results that were available during my care of the patient were reviewed by me and considered in my medical decision making (see chart for details).    Presents with sore throat.  Differential diagnosis includes: Bacterial pharyngitis, viral pharyngitis, influenza, covid, URI, PTA, RPA, epiglottitis, bacterial tracheitis, EBV, Ludwigs angina, acute HIV, including other diagnoses.  History obtained from: Patient.  Plan at this time/rationale: Rapid strep test.  All ordered tests including imaging and labs were independently reviewed and interpreted by myself. Notable findings: Negative rapid strep test.  Plan: Patient presented with sore throat. Rapid strep was obtained and was negative. The patient did not have trismus, hot potato voice, uvula deviation, unilateral tonsillar swelling, toxic appearance, prolonged course/posterior LAD/splenomegaly, drooling, or pain with movement of the trachea. Additionally, patient able to tolerate PO medicines outpatient. Patient can take ibuprofen  and Tylenol  as needed for pain relief. Patient should  follow up with their PCP in the next several days. Strict return precautions to the urgent care or emergency department were discussed including if they experience any difficulty breathing, inability to swallow, drooling, have neck pain with movement, symptoms worsen, or if there are any other concerns.   Disposition: Stable to discharge home.    All questions answered to the best of this examiner's ability. Advised to f/u with PCP for further eval and/or reassessment. Patient agrees to plan.  An appropriate evaluation has been performed, and in my medical judgment there is currently no evidence of an immediate life-threatening or surgical condition. Discharge is therefore indicated at this time.  This document was created using the aid of voice recognition Scientist, clinical (histocompatibility and immunogenetics). Final Clinical Impressions(s) / UC Diagnoses   Final diagnoses:  Sore throat     Discharge Instructions      We recommend following up with your primary care provider in the next several days if symptoms are not improving.  Your strep test today was negative.  You can take Tylenol  and ibuprofen  as needed for pain relief.  You can try cough drops, throat lozenges, throat numbing spray, gargling warm salt water, tea with honey, and other warm liquids to help with your symptoms.  Please go to the emergency department immediately if you have any significant swelling of the neck, severe neck pain, difficulty breathing, begin drooling, or if you have any other concerns.     ED Prescriptions   None    PDMP not reviewed this encounter.   Melonie Locus, PA-C 08/20/23 1910

## 2023-08-23 ENCOUNTER — Encounter: Payer: Self-pay | Admitting: Nurse Practitioner

## 2023-08-23 ENCOUNTER — Ambulatory Visit (INDEPENDENT_AMBULATORY_CARE_PROVIDER_SITE_OTHER): Payer: Self-pay | Admitting: Nurse Practitioner

## 2023-08-23 ENCOUNTER — Ambulatory Visit (HOSPITAL_COMMUNITY)
Admission: RE | Admit: 2023-08-23 | Discharge: 2023-08-23 | Disposition: A | Discharge status: Home / Self Care | Source: Ambulatory Visit | Attending: Nurse Practitioner | Admitting: Nurse Practitioner

## 2023-08-23 VITALS — BP 120/68 | HR 77 | Temp 98.2°F | Wt 220.8 lb

## 2023-08-23 DIAGNOSIS — M5442 Lumbago with sciatica, left side: Secondary | ICD-10-CM

## 2023-08-23 DIAGNOSIS — M545 Low back pain, unspecified: Secondary | ICD-10-CM | POA: Diagnosis not present

## 2023-08-23 DIAGNOSIS — D5700 Hb-SS disease with crisis, unspecified: Secondary | ICD-10-CM

## 2023-08-23 DIAGNOSIS — G8929 Other chronic pain: Secondary | ICD-10-CM

## 2023-08-23 DIAGNOSIS — Z13228 Encounter for screening for other metabolic disorders: Secondary | ICD-10-CM | POA: Diagnosis not present

## 2023-08-23 DIAGNOSIS — M4187 Other forms of scoliosis, lumbosacral region: Secondary | ICD-10-CM | POA: Diagnosis not present

## 2023-08-23 MED ORDER — LIDOCAINE 5 % EX PTCH: 1.0000 | MEDICATED_PATCH | CUTANEOUS | 0 refills | Status: AC
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
## 2023-08-24 ENCOUNTER — Ambulatory Visit: Payer: Self-pay | Admitting: Nurse Practitioner

## 2023-08-24 LAB — CMP14+CBC/D/PLT+FER+RETIC+V...
ALT: 17 IU/L (ref 0–44)
AST: 24 IU/L (ref 0–40)
Albumin: 4.4 g/dL (ref 4.1–5.1)
Alkaline Phosphatase: 50 IU/L (ref 44–121)
BUN/Creatinine Ratio: 9 (ref 9–20)
BUN: 8 mg/dL (ref 6–20)
Basophils Absolute: 0.1 x10E3/uL (ref 0.0–0.2)
Basos: 1 %
Bilirubin Total: 4 mg/dL — ABNORMAL HIGH (ref 0.0–1.2)
CO2: 19 mmol/L — ABNORMAL LOW (ref 20–29)
Calcium: 9.4 mg/dL (ref 8.7–10.2)
Chloride: 104 mmol/L (ref 96–106)
Creatinine, Ser: 0.9 mg/dL (ref 0.76–1.27)
EOS (ABSOLUTE): 0.3 x10E3/uL (ref 0.0–0.4)
Eos: 3 %
Ferritin: 135 ng/mL (ref 30–400)
Globulin, Total: 2.9 g/dL (ref 1.5–4.5)
Glucose: 85 mg/dL (ref 70–99)
Hematocrit: 28.7 % — ABNORMAL LOW (ref 37.5–51.0)
Hemoglobin: 9.6 g/dL — ABNORMAL LOW (ref 13.0–17.7)
Immature Grans (Abs): 0 x10E3/uL (ref 0.0–0.1)
Immature Granulocytes: 0 %
Lymphocytes Absolute: 2.3 x10E3/uL (ref 0.7–3.1)
Lymphs: 23 %
MCH: 35.6 pg — ABNORMAL HIGH (ref 26.6–33.0)
MCHC: 33.4 g/dL (ref 31.5–35.7)
MCV: 106 fL — ABNORMAL HIGH (ref 79–97)
Monocytes Absolute: 1 x10E3/uL — ABNORMAL HIGH (ref 0.1–0.9)
Monocytes: 10 %
NRBC: 2 % — ABNORMAL HIGH (ref 0–0)
Neutrophils Absolute: 6.4 x10E3/uL (ref 1.4–7.0)
Neutrophils: 63 %
Platelets: 542 x10E3/uL — ABNORMAL HIGH (ref 150–450)
Potassium: 5 mmol/L (ref 3.5–5.2)
RBC: 2.7 x10E6/uL — CL (ref 4.14–5.80)
RDW: 19.7 % — ABNORMAL HIGH (ref 11.6–15.4)
Retic Ct Pct: 8 % — ABNORMAL HIGH (ref 0.6–2.6)
Sodium: 141 mmol/L (ref 134–144)
Total Protein: 7.3 g/dL (ref 6.0–8.5)
Vit D, 25-Hydroxy: 41.5 ng/mL (ref 30.0–100.0)
WBC: 10.1 x10E3/uL (ref 3.4–10.8)
eGFR: 117 mL/min/1.73 (ref >59)

## 2023-09-07 ENCOUNTER — Ambulatory Visit (HOSPITAL_BASED_OUTPATIENT_CLINIC_OR_DEPARTMENT_OTHER)
Admission: RE | Admit: 2023-09-07 | Discharge: 2023-09-07 | Disposition: A | Discharge status: Home / Self Care | Source: Ambulatory Visit | Attending: Chiropractic Medicine | Admitting: Chiropractic Medicine

## 2023-09-07 ENCOUNTER — Other Ambulatory Visit (HOSPITAL_BASED_OUTPATIENT_CLINIC_OR_DEPARTMENT_OTHER): Payer: Self-pay | Admitting: Chiropractic Medicine

## 2023-09-07 DIAGNOSIS — M79604 Pain in right leg: Secondary | ICD-10-CM

## 2023-09-07 DIAGNOSIS — J984 Other disorders of lung: Secondary | ICD-10-CM | POA: Diagnosis not present

## 2023-09-07 DIAGNOSIS — J4540 Moderate persistent asthma, uncomplicated: Secondary | ICD-10-CM | POA: Diagnosis not present

## 2023-09-07 DIAGNOSIS — M545 Low back pain, unspecified: Secondary | ICD-10-CM | POA: Insufficient documentation

## 2023-09-07 DIAGNOSIS — R0602 Shortness of breath: Secondary | ICD-10-CM | POA: Diagnosis not present

## 2023-10-04 DIAGNOSIS — F411 Generalized anxiety disorder: Secondary | ICD-10-CM | POA: Diagnosis not present

## 2023-10-07 DIAGNOSIS — M79605 Pain in left leg: Secondary | ICD-10-CM | POA: Diagnosis not present

## 2023-10-07 DIAGNOSIS — M79604 Pain in right leg: Secondary | ICD-10-CM | POA: Diagnosis not present

## 2023-10-07 DIAGNOSIS — D5700 Hb-SS disease with crisis, unspecified: Secondary | ICD-10-CM | POA: Diagnosis not present

## 2023-10-08 DIAGNOSIS — D5700 Hb-SS disease with crisis, unspecified: Secondary | ICD-10-CM | POA: Diagnosis not present

## 2023-10-13 DIAGNOSIS — D5701 Hb-SS disease with acute chest syndrome: Secondary | ICD-10-CM | POA: Diagnosis not present

## 2023-10-13 DIAGNOSIS — Z8673 Personal history of transient ischemic attack (TIA), and cerebral infarction without residual deficits: Secondary | ICD-10-CM | POA: Diagnosis not present

## 2023-10-13 DIAGNOSIS — R0902 Hypoxemia: Secondary | ICD-10-CM | POA: Diagnosis not present

## 2023-10-13 DIAGNOSIS — Z86711 Personal history of pulmonary embolism: Secondary | ICD-10-CM | POA: Diagnosis not present

## 2023-10-13 DIAGNOSIS — F419 Anxiety disorder, unspecified: Secondary | ICD-10-CM | POA: Diagnosis not present

## 2023-10-13 DIAGNOSIS — J810 Acute pulmonary edema: Secondary | ICD-10-CM | POA: Diagnosis not present

## 2023-10-13 DIAGNOSIS — Z7901 Long term (current) use of anticoagulants: Secondary | ICD-10-CM | POA: Diagnosis not present

## 2023-10-13 DIAGNOSIS — R0789 Other chest pain: Secondary | ICD-10-CM | POA: Diagnosis not present

## 2023-10-13 DIAGNOSIS — Z8701 Personal history of pneumonia (recurrent): Secondary | ICD-10-CM | POA: Diagnosis not present

## 2023-10-13 DIAGNOSIS — I517 Cardiomegaly: Secondary | ICD-10-CM | POA: Diagnosis not present

## 2023-10-13 DIAGNOSIS — J189 Pneumonia, unspecified organism: Secondary | ICD-10-CM | POA: Diagnosis not present

## 2023-10-13 DIAGNOSIS — J9811 Atelectasis: Secondary | ICD-10-CM | POA: Diagnosis not present

## 2023-10-13 DIAGNOSIS — R0602 Shortness of breath: Secondary | ICD-10-CM | POA: Diagnosis not present

## 2023-10-13 DIAGNOSIS — J45909 Unspecified asthma, uncomplicated: Secondary | ICD-10-CM | POA: Diagnosis not present

## 2023-10-15 DIAGNOSIS — D5701 Hb-SS disease with acute chest syndrome: Secondary | ICD-10-CM | POA: Diagnosis not present

## 2023-10-16 DIAGNOSIS — D5701 Hb-SS disease with acute chest syndrome: Secondary | ICD-10-CM | POA: Diagnosis not present

## 2023-10-17 DIAGNOSIS — R0902 Hypoxemia: Secondary | ICD-10-CM | POA: Diagnosis not present

## 2023-10-17 DIAGNOSIS — D5701 Hb-SS disease with acute chest syndrome: Secondary | ICD-10-CM | POA: Diagnosis not present

## 2023-10-18 DIAGNOSIS — D5701 Hb-SS disease with acute chest syndrome: Secondary | ICD-10-CM | POA: Diagnosis not present

## 2023-10-19 DIAGNOSIS — D5701 Hb-SS disease with acute chest syndrome: Secondary | ICD-10-CM | POA: Diagnosis not present

## 2023-10-22 ENCOUNTER — Telehealth: Payer: Self-pay

## 2023-10-22 NOTE — Transitions of Care (Post Inpatient/ED Visit) (Signed)
   10/22/2023  Name: Gregory Espinoza MRN: [id number] DOB: [birth year]  Today's TOC FU Call Status: Today's TOC FU Call Status:: Unsuccessful Call (1st Attempt) Unsuccessful Call (1st Attempt) Date: 10/22/23  Attempted to reach the patient regarding the most recent Inpatient/ED visit.  Follow Up Plan: Additional outreach attempts will be made to reach the patient to complete the Transitions of Care (Post Inpatient/ED visit) call.  Alan Ee, RN, BSN, CEN [employer]- Transition of Care Team.  [facility] [phone]

## 2023-10-23 ENCOUNTER — Telehealth: Payer: Self-pay

## 2023-10-23 NOTE — Transitions of Care (Post Inpatient/ED Visit) (Signed)
   10/23/2023  Name: Gregory Espinoza MRN: [id number] DOB: 1990/10/27  Today's TOC FU Call Status: Today's TOC FU Call Status:: Unsuccessful Call (2nd Attempt) Unsuccessful Call (2nd Attempt) Date: 10/23/23  Attempted to reach the patient regarding the most recent Inpatient/ED visit.  Follow Up Plan: Additional outreach attempts will be made to reach the patient to complete the Transitions of Care (Post Inpatient/ED visit) call.   Arvin Seip RN, BSN, CCM [employer], Population Health Case Manager Phone: [phone]

## 2023-10-24 ENCOUNTER — Telehealth: Payer: Self-pay

## 2023-10-24 DIAGNOSIS — F411 Generalized anxiety disorder: Secondary | ICD-10-CM | POA: Diagnosis not present

## 2023-10-24 NOTE — Transitions of Care (Post Inpatient/ED Visit) (Signed)
   10/24/2023  Name: Gregory Espinoza MRN: [id number] DOB: 1990-04-02  Today's TOC FU Call Status: Today's TOC FU Call Status:: Successful TOC FU Call Completed TOC FU Call Complete Date: 10/24/23 Patient's Name and Date of Birth confirmed.  Transition Care Management Follow-up Telephone Call How have you been since you were released from the hospital?: Better Any questions or concerns?: No  Items Reviewed: Did you receive and understand the discharge instructions provided?: Yes Medications obtained,verified, and reconciled?: Yes (Medications Reviewed)  Medications Reviewed Today: Patient states he understands his medications and was on lunch break and had to return to work  Medications Reviewed Today   Medications were not reviewed in this encounter     Follow up appointments reviewed: PCP Follow-up appointment confirmed?: Yes Date of PCP follow-up appointment?: 11/02/23 Follow-up Provider: PCP Specialist Hospital Follow-up appointment confirmed?:  (patient states he has appointments with cardio, hematology at [facility])  Patient declined TOC enrollment - unable to complete full call - Patient had to return to work from lunch break and denied any needs  Shona Prow RN, CCM [facility]  VBCI-Population Health RN Care Manager [phone]

## 2023-10-25 DIAGNOSIS — D571 Sickle-cell disease without crisis: Secondary | ICD-10-CM | POA: Diagnosis not present

## 2023-10-26 DIAGNOSIS — D5701 Hb-SS disease with acute chest syndrome: Secondary | ICD-10-CM | POA: Diagnosis not present

## 2023-10-26 DIAGNOSIS — J4540 Moderate persistent asthma, uncomplicated: Secondary | ICD-10-CM | POA: Diagnosis not present

## 2023-10-26 DIAGNOSIS — J159 Unspecified bacterial pneumonia: Secondary | ICD-10-CM | POA: Diagnosis not present

## 2023-10-26 DIAGNOSIS — J984 Other disorders of lung: Secondary | ICD-10-CM | POA: Diagnosis not present

## 2023-10-26 DIAGNOSIS — Z23 Encounter for immunization: Secondary | ICD-10-CM | POA: Diagnosis not present

## 2023-10-30 DIAGNOSIS — F411 Generalized anxiety disorder: Secondary | ICD-10-CM | POA: Diagnosis not present

## 2023-11-01 DIAGNOSIS — D571 Sickle-cell disease without crisis: Secondary | ICD-10-CM | POA: Diagnosis not present

## 2023-11-02 ENCOUNTER — Encounter: Payer: Self-pay | Admitting: Nurse Practitioner

## 2023-11-02 ENCOUNTER — Ambulatory Visit (INDEPENDENT_AMBULATORY_CARE_PROVIDER_SITE_OTHER): Payer: Self-pay | Admitting: Nurse Practitioner

## 2023-11-02 VITALS — BP 129/73 | HR 70 | Wt 222.2 lb

## 2023-11-02 DIAGNOSIS — D5701 Hb-SS disease with acute chest syndrome: Secondary | ICD-10-CM | POA: Diagnosis not present

## 2023-11-02 NOTE — Progress Notes (Signed)
 Subjective   Patient ID: Gregory Espinoza, male    DOB: November 04, 1990, 33 y.o.   MRN: [id number]  Chief Complaint  Patient presents with   Hospitalization Follow-up    Referring provider: Oley Bascom RAMAN, NP  Retia Dannette Mcwethy is a 33 y.o. male with Past Medical History: 12/2018: Erythrocytopenia No date: Hemoglobin S-S disease (HCC) 10/2019: History of PCR DNA positive for HSV1 No date: Pulmonary embolism (HCC)     Comment:  per pt No date: Sickle cell anemia (HCC) 12/2018: Thrombocytopenia 12/2018: Vitamin D  deficiency   HPI  Patient presents today for a hospital follow-up.  He was recently hospitalized through [facility] with acute chest syndrome.  He was treated with antibiotics and discharged home with oxygen.  He states that he is doing much better and no longer needs his oxygen.  O2 sats were 97 in the office today on room air.  Patient has followed with cardiology and pulmonary.  He does see [facility] and will be undergoing gene therapy to cure his sickle cell.  He had a port placed yesterday for this. Denies f/c/s, n/v/d, hemoptysis, PND, leg swelling Denies chest pain or edema     Allergies  Allergen Reactions   Peanut Butter Flavoring Agent (Non-Screening) Anaphylaxis    Pt stated he has shortness of breathe when eating peanuts and peanut butter   Sunflower Oil Anaphylaxis    Pt stated he has shortness of breathe when eating sunflower seeds and oil   Amoxicillin  Hives and Palpitations    Patient states he had an allergic reaction to this medication in the past.    Prilosec [Omeprazole ] Other (See Comments)    Pt states he can't breath too well    Immunization History  Administered Date(s) Administered   Influenza,inj,Quad PF,6+ Mos 03/14/2018, 11/07/2018, 11/10/2020, 11/25/2021   Influenza,inj,Quad PF,6-35 Mos 11/10/2019   Influenza-Unspecified 03/14/2018   Meningococcal Conjugate 04/27/2014   Pneumococcal Conjugate-13 04/27/2014   Pneumococcal  Polysaccharide-23 03/14/2018   Rabies, IM 10/04/2020, 10/07/2020, 10/11/2020, 10/22/2020    Tobacco History: Social History   Tobacco Use  Smoking Status Never  Smokeless Tobacco Never   Counseling given: Not Answered   Outpatient Encounter Medications as of 11/02/2023  Medication Sig   acetaminophen  (TYLENOL ) 325 MG tablet Take 2 tablets (650 mg total) by mouth every 6 (six) hours as needed for moderate pain.   albuterol  (VENTOLIN  HFA) 108 (90 Base) MCG/ACT inhaler Inhale 2 puffs into the lungs every 6 (six) hours as needed for wheezing or shortness of breath.   apixaban  (ELIQUIS ) 2.5 MG TABS tablet Take 2.5 mg by mouth 2 (two) times daily.   baclofen  (LIORESAL ) 10 MG tablet Take 0.5-1 tablets (5-10 mg total) by mouth 3 (three) times daily as needed for muscle spasms. (Patient not taking: Reported on 03/30/2023)   cetirizine  (ZYRTEC  ALLERGY) 10 MG tablet Take 1 tablet (10 mg total) by mouth daily.   cyclobenzaprine  (FLEXERIL ) 5 MG tablet Take 1 tablet (5 mg total) by mouth at bedtime as needed.   famotidine  (PEPCID ) 20 MG tablet Take 1 tablet (20 mg total) by mouth 2 (two) times daily. (Patient not taking: Reported on 04/02/2023)   folic acid (FOLVITE) 1 MG tablet Take 1 mg by mouth daily.   hydroxyurea (HYDREA) 500 MG capsule Take 500 mg by mouth daily. May take with food to minimize GI side effects.   lidocaine  (LIDODERM ) 5 % Place 1 patch onto the skin daily. Remove & Discard patch within 12 hours or as  directed by MD   Multiple Vitamin (ONE-A-DAY 55 PLUS PO) Take 1 tablet by mouth daily.   Phenylephrine-DM-GG-APAP (VICKS DAYQUIL SEVERE COLD/FLU) 5-10-200-325 MG TABS Take by mouth. (Patient not taking: Reported on 03/30/2023)   predniSONE  (DELTASONE ) 10 MG tablet Take 3 tablets (30 mg total) by mouth daily with breakfast. (Patient not taking: Reported on 08/23/2023)   promethazine -dextromethorphan (PROMETHAZINE -DM) 6.25-15 MG/5ML syrup Take 5 mLs by mouth 3 (three) times daily as needed  for cough. (Patient not taking: Reported on 03/30/2023)   Vitamin D , Ergocalciferol , (DRISDOL ) 1.25 MG (50000 UNIT) CAPS capsule TAKE 1 CAPSULE BY MOUTH EVERY 7 DAYS FOR 12 WEEKS   No facility-administered encounter medications on file as of 11/02/2023.    Review of Systems  Review of Systems  Constitutional: Negative.   HENT: Negative.    Cardiovascular: Negative.   Gastrointestinal: Negative.   Allergic/Immunologic: Negative.   Neurological: Negative.   Psychiatric/Behavioral: Negative.       Objective:   BP 129/73 (BP Location: Left Arm, Patient Position: Sitting, Cuff Size: Normal)   Pulse 70   Wt 222 lb 3.2 oz (100.8 kg)   SpO2 97%   BMI 25.68 kg/m   Wt Readings from Last 5 Encounters:  11/02/23 222 lb 3.2 oz (100.8 kg)  08/23/23 220 lb 12.8 oz (100.2 kg)  04/02/23 222 lb 9.6 oz (101 kg)  11/30/22 222 lb (100.7 kg)  05/29/22 219 lb 6.4 oz (99.5 kg)     Physical Exam Vitals and nursing note reviewed.  Constitutional:      General: He is not in acute distress.    Appearance: He is well-developed.  Cardiovascular:     Rate and Rhythm: Normal rate and regular rhythm.  Pulmonary:     Effort: Pulmonary effort is normal.     Breath sounds: Normal breath sounds.  Skin:    General: Skin is warm and dry.  Neurological:     Mental Status: He is alert and oriented to person, place, and time.       Assessment & Plan:      Hb-SS disease with acute chest syndrome [facility])    Resolved  Please follow with pulmonary and cardiology as scheduled   Return in about 3 months (around 02/02/2024).   Bascom GORMAN Borer, NP 11/02/2023

## 2023-11-07 DIAGNOSIS — D571 Sickle-cell disease without crisis: Secondary | ICD-10-CM | POA: Diagnosis not present

## 2023-11-08 DIAGNOSIS — D571 Sickle-cell disease without crisis: Secondary | ICD-10-CM | POA: Diagnosis not present

## 2023-11-09 DIAGNOSIS — D571 Sickle-cell disease without crisis: Secondary | ICD-10-CM | POA: Diagnosis not present

## 2023-11-12 DIAGNOSIS — D571 Sickle-cell disease without crisis: Secondary | ICD-10-CM | POA: Diagnosis not present

## 2023-11-15 ENCOUNTER — Ambulatory Visit
Admission: RE | Admit: 2023-11-15 | Discharge: 2023-11-15 | Disposition: A | Discharge status: Home / Self Care | Source: Ambulatory Visit | Attending: Family Medicine | Admitting: Family Medicine

## 2023-11-15 VITALS — BP 125/79 | HR 88 | Temp 98.3°F | Resp 20

## 2023-11-15 DIAGNOSIS — B9789 Other viral agents as the cause of diseases classified elsewhere: Secondary | ICD-10-CM

## 2023-11-15 DIAGNOSIS — J988 Other specified respiratory disorders: Secondary | ICD-10-CM | POA: Diagnosis not present

## 2023-11-15 LAB — POC COVID19/FLU A&B COMBO
Covid Antigen, POC: NEGATIVE
Influenza A Antigen, POC: NEGATIVE
Influenza B Antigen, POC: NEGATIVE

## 2023-11-15 MED ORDER — CETIRIZINE HCL 10 MG PO TABS: 10.0000 mg | ORAL_TABLET | Freq: Every day | ORAL | 0 refills | Status: AC

## 2023-11-15 MED ORDER — PREDNISONE 10 MG PO TABS: 30.0000 mg | ORAL_TABLET | Freq: Every day | ORAL | 0 refills | Status: AC

## 2023-11-15 MED ORDER — PROMETHAZINE-DM 6.25-15 MG/5ML PO SYRP
5.0000 mL | ORAL_SOLUTION | Freq: Three times a day (TID) | ORAL | 0 refills | Status: AC | PRN
Start: 2023-11-15 — End: ?

## 2023-11-15 NOTE — Discharge Instructions (Addendum)
 We will manage this as a viral respiratory infection. For sore throat or cough try using a honey-based tea. Use 3 teaspoons of honey with juice squeezed from half lemon. Place shaved pieces of ginger into 1/2-1 cup of water and warm over stove top. Then mix the ingredients and repeat every 4 hours as needed. Please take Tylenol  500mg -650mg  once every 6 hours for fevers, aches and pains. Hydrate very well with at least 2 liters (64 ounces) of water. Eat light meals such as soups (chicken and noodles, chicken wild rice, vegetable).  Do not eat any foods that you are allergic to.  Start an antihistamine like Zyrtec  (10mg  daily) for postnasal drainage, sinus congestion.  You can take this together with prednisone  for a respiratory boost. Use cough syrup as needed.

## 2023-11-15 NOTE — ED Provider Notes (Signed)
 [facility]  Note:  This document was prepared using Conservation officer, historic buildings and may include unintentional dictation errors.  MRN: [id number] DOB: 1990/11/15  Subjective:   Gregory Espinoza is a 33 y.o. male presenting for 4 day history of sinus congestion, cough, sinus pressure, throat pain, drainage. No fever, chest pain, shob, wheezing. Has done multiple otc measures with minimal relief. Has previously used prednisone  with good results. No smoking of any kind including cigarettes, cigars, vaping, marijuana use.    No current facility-administered medications for this encounter.  Current Outpatient Medications:    acetaminophen  (TYLENOL ) 325 MG tablet, Take 2 tablets (650 mg total) by mouth every 6 (six) hours as needed for moderate pain., Disp: 30 tablet, Rfl: 0   albuterol  (VENTOLIN  HFA) 108 (90 Base) MCG/ACT inhaler, Inhale 2 puffs into the lungs every 6 (six) hours as needed for wheezing or shortness of breath., Disp: 8 g, Rfl: 0   apixaban  (ELIQUIS ) 2.5 MG TABS tablet, Take 2.5 mg by mouth 2 (two) times daily., Disp: , Rfl:    baclofen  (LIORESAL ) 10 MG tablet, Take 0.5-1 tablets (5-10 mg total) by mouth 3 (three) times daily as needed for muscle spasms. (Patient not taking: Reported on 03/30/2023), Disp: 30 each, Rfl: 0   cetirizine  (ZYRTEC  ALLERGY) 10 MG tablet, Take 1 tablet (10 mg total) by mouth daily., Disp: 30 tablet, Rfl: 0   cyclobenzaprine  (FLEXERIL ) 5 MG tablet, Take 1 tablet (5 mg total) by mouth at bedtime as needed., Disp: 30 tablet, Rfl: 0   famotidine  (PEPCID ) 20 MG tablet, Take 1 tablet (20 mg total) by mouth 2 (two) times daily. (Patient not taking: Reported on 04/02/2023), Disp: 60 tablet, Rfl: 0   folic acid (FOLVITE) 1 MG tablet, Take 1 mg by mouth daily., Disp: , Rfl:    hydroxyurea (HYDREA) 500 MG capsule, Take 500 mg by mouth daily. May take with food to minimize GI side effects., Disp: , Rfl:    lidocaine  (LIDODERM ) 5 %, Place  1 patch onto the skin daily. Remove & Discard patch within 12 hours or as directed by MD, Disp: 30 patch, Rfl: 0   Multiple Vitamin (ONE-A-DAY 55 PLUS PO), Take 1 tablet by mouth daily., Disp: , Rfl:    Phenylephrine-DM-GG-APAP (VICKS DAYQUIL SEVERE COLD/FLU) 5-10-200-325 MG TABS, Take by mouth. (Patient not taking: Reported on 03/30/2023), Disp: , Rfl:    predniSONE  (DELTASONE ) 10 MG tablet, Take 3 tablets (30 mg total) by mouth daily with breakfast. (Patient not taking: Reported on 08/23/2023), Disp: 15 tablet, Rfl: 0   promethazine -dextromethorphan (PROMETHAZINE -DM) 6.25-15 MG/5ML syrup, Take 5 mLs by mouth 3 (three) times daily as needed for cough. (Patient not taking: Reported on 03/30/2023), Disp: 200 mL, Rfl: 0   Vitamin D , Ergocalciferol , (DRISDOL ) 1.25 MG (50000 UNIT) CAPS capsule, TAKE 1 CAPSULE BY MOUTH EVERY 7 DAYS FOR 12 WEEKS, Disp: 5 capsule, Rfl: 2   Allergies  Allergen Reactions   Peanut Butter Flavoring Agent (Non-Screening) Anaphylaxis    Pt stated he has shortness of breathe when eating peanuts and peanut butter   Sunflower Oil Anaphylaxis    Pt stated he has shortness of breathe when eating sunflower seeds and oil   Amoxicillin  Hives and Palpitations    Patient states he had an allergic reaction to this medication in the past.    Prilosec [Omeprazole ] Other (See Comments)    Pt states he can't breath too well    Past Medical History:  Diagnosis Date  Erythrocytopenia 12/2018   Hemoglobin S-S disease (HCC)    History of PCR DNA positive for HSV1 10/2019   Pulmonary embolism (HCC)    per pt   Sickle cell anemia (HCC)    Thrombocytopenia 12/2018   Vitamin D  deficiency 12/2018     Past Surgical History:  Procedure Laterality Date   DENTAL SURGERY      Family History  Problem Relation Age of Onset   Hypertension Mother    Hypertension Father    Sickle cell anemia Brother     Social History   Tobacco Use   Smoking status: Never   Smokeless tobacco: Never   Vaping Use   Vaping status: Never Used  Substance Use Topics   Alcohol use: Not Currently   Drug use: Never    ROS   Objective:   Vitals: BP 125/79 (BP Location: Right Arm)   Pulse 88   Temp 98.3 F (36.8 C) (Oral)   Resp 20   SpO2 96%   Physical Exam Constitutional:      General: He is not in acute distress.    Appearance: Normal appearance. He is well-developed and normal weight. He is not ill-appearing, toxic-appearing or diaphoretic.  HENT:     Head: Normocephalic and atraumatic.     Right Ear: Tympanic membrane, ear canal and external ear normal. No drainage, swelling or tenderness. No middle ear effusion. There is no impacted cerumen. Tympanic membrane is not erythematous or bulging.     Left Ear: Tympanic membrane, ear canal and external ear normal. No drainage, swelling or tenderness.  No middle ear effusion. There is no impacted cerumen. Tympanic membrane is not erythematous or bulging.     Nose: Congestion present. No rhinorrhea.     Mouth/Throat:     Mouth: Mucous membranes are moist.     Pharynx: No pharyngeal swelling, oropharyngeal exudate, posterior oropharyngeal erythema or uvula swelling.     Tonsils: No tonsillar exudate or tonsillar abscesses. 0 on the right. 0 on the left.  Eyes:     General: No scleral icterus.       Right eye: No discharge.        Left eye: No discharge.     Extraocular Movements: Extraocular movements intact.     Conjunctiva/sclera: Conjunctivae normal.  Cardiovascular:     Rate and Rhythm: Normal rate and regular rhythm.     Heart sounds: Normal heart sounds. No murmur heard.    No friction rub. No gallop.  Pulmonary:     Effort: Pulmonary effort is normal. No respiratory distress.     Breath sounds: Normal breath sounds. No stridor. No wheezing, rhonchi or rales.  Musculoskeletal:     Cervical back: Normal range of motion and neck supple. No rigidity. No muscular tenderness.  Neurological:     General: No focal deficit  present.     Mental Status: He is alert and oriented to person, place, and time.  Psychiatric:        Mood and Affect: Mood normal.        Behavior: Behavior normal.        Thought Content: Thought content normal.        Judgment: Judgment normal.     Results for orders placed or performed during the hospital encounter of 11/15/23 (from the past 24 hours)  POC Covid19/Flu A&B Antigen     Status: None   Collection Time: 11/15/23  2:10 PM  Result Value Ref Range   Influenza A  Antigen, POC Negative Negative   Influenza B Antigen, POC Negative Negative   Covid Antigen, POC Negative Negative    Assessment and Plan :   PDMP not reviewed this encounter.  1. Viral respiratory infection    Will use prednisone  at 30mg  x5 days as he has previously had success with this measure. Use supportive care otherwise for suspected viral respiratory infection. Deferred imaging given clear cardiopulmonary exam, hemodynamically stable vital signs.  Counseled patient on potential for adverse effects with medications prescribed/recommended today, ER and return-to-clinic precautions discussed, patient verbalized understanding.     Christopher Savannah, PA-C 11/15/23 1438

## 2023-11-15 NOTE — ED Triage Notes (Signed)
 Pt c/o prod cough, nasal congestion, head/chest congestion, sore throat x 3-4 days-taking dayquil, zinc and airborne-NAD-steady gait

## 2023-11-18 DIAGNOSIS — R0602 Shortness of breath: Secondary | ICD-10-CM | POA: Diagnosis not present

## 2023-12-04 DIAGNOSIS — D571 Sickle-cell disease without crisis: Secondary | ICD-10-CM | POA: Diagnosis not present

## 2023-12-06 DIAGNOSIS — D571 Sickle-cell disease without crisis: Secondary | ICD-10-CM | POA: Diagnosis not present

## 2023-12-07 DIAGNOSIS — D571 Sickle-cell disease without crisis: Secondary | ICD-10-CM | POA: Diagnosis not present

## 2023-12-10 DIAGNOSIS — D649 Anemia, unspecified: Secondary | ICD-10-CM | POA: Diagnosis not present

## 2023-12-10 DIAGNOSIS — D7289 Other specified disorders of white blood cells: Secondary | ICD-10-CM | POA: Diagnosis not present

## 2023-12-10 DIAGNOSIS — Z1331 Encounter for screening for depression: Secondary | ICD-10-CM | POA: Diagnosis not present

## 2023-12-10 DIAGNOSIS — Z01818 Encounter for other preprocedural examination: Secondary | ICD-10-CM | POA: Diagnosis not present

## 2023-12-10 DIAGNOSIS — D571 Sickle-cell disease without crisis: Secondary | ICD-10-CM | POA: Diagnosis not present

## 2023-12-19 DIAGNOSIS — R0609 Other forms of dyspnea: Secondary | ICD-10-CM | POA: Diagnosis not present

## 2023-12-19 DIAGNOSIS — R0602 Shortness of breath: Secondary | ICD-10-CM | POA: Diagnosis not present

## 2023-12-24 DIAGNOSIS — Z01818 Encounter for other preprocedural examination: Secondary | ICD-10-CM | POA: Diagnosis not present

## 2023-12-24 DIAGNOSIS — D571 Sickle-cell disease without crisis: Secondary | ICD-10-CM | POA: Diagnosis not present

## 2023-12-25 DIAGNOSIS — F411 Generalized anxiety disorder: Secondary | ICD-10-CM | POA: Diagnosis not present

## 2024-01-04 DIAGNOSIS — D571 Sickle-cell disease without crisis: Secondary | ICD-10-CM | POA: Diagnosis not present

## 2024-01-07 DIAGNOSIS — T8039XA Other ABO incompatibility reaction due to transfusion of blood or blood products, initial encounter: Secondary | ICD-10-CM | POA: Diagnosis not present

## 2024-01-07 DIAGNOSIS — D571 Sickle-cell disease without crisis: Secondary | ICD-10-CM | POA: Diagnosis not present

## 2024-01-08 DIAGNOSIS — R7689 Other specified abnormal immunological findings in serum: Secondary | ICD-10-CM | POA: Diagnosis not present

## 2024-01-08 DIAGNOSIS — D571 Sickle-cell disease without crisis: Secondary | ICD-10-CM | POA: Diagnosis not present

## 2024-01-09 DIAGNOSIS — D571 Sickle-cell disease without crisis: Secondary | ICD-10-CM | POA: Diagnosis not present

## 2024-01-10 DIAGNOSIS — D571 Sickle-cell disease without crisis: Secondary | ICD-10-CM | POA: Diagnosis not present

## 2024-01-17 DIAGNOSIS — F411 Generalized anxiety disorder: Secondary | ICD-10-CM | POA: Diagnosis not present

## 2024-01-18 DIAGNOSIS — J159 Unspecified bacterial pneumonia: Secondary | ICD-10-CM | POA: Diagnosis not present

## 2024-01-26 ENCOUNTER — Other Ambulatory Visit: Payer: Self-pay | Admitting: Nurse Practitioner

## 2024-01-26 DIAGNOSIS — E569 Vitamin deficiency, unspecified: Secondary | ICD-10-CM

## 2024-01-29 NOTE — Telephone Encounter (Signed)
 Please advise [facility]

## 2024-02-01 ENCOUNTER — Ambulatory Visit: Payer: Self-pay | Admitting: Nurse Practitioner

## 2024-03-05 NOTE — Telephone Encounter (Signed)
 Letter has been created from and placed in patient's mychart.

## 2024-04-09 ENCOUNTER — Ambulatory Visit: Admitting: Nurse Practitioner
